# Patient Record
Sex: Male | Born: 1945 | Race: White | Hispanic: No | Marital: Married | Smoking: Never smoker
Health system: Southern US, Community
[De-identification: ages and names within clinical notes are randomized; demographics above are authoritative.]

## PROBLEM LIST (undated history)

## (undated) DIAGNOSIS — H269 Unspecified cataract: Secondary | ICD-10-CM

## (undated) DIAGNOSIS — M1990 Unspecified osteoarthritis, unspecified site: Secondary | ICD-10-CM

## (undated) DIAGNOSIS — E559 Vitamin D deficiency, unspecified: Secondary | ICD-10-CM

## (undated) DIAGNOSIS — J45909 Unspecified asthma, uncomplicated: Secondary | ICD-10-CM

## (undated) DIAGNOSIS — I2510 Atherosclerotic heart disease of native coronary artery without angina pectoris: Secondary | ICD-10-CM

## (undated) DIAGNOSIS — R55 Syncope and collapse: Secondary | ICD-10-CM

## (undated) DIAGNOSIS — E039 Hypothyroidism, unspecified: Secondary | ICD-10-CM

## (undated) DIAGNOSIS — C4491 Basal cell carcinoma of skin, unspecified: Secondary | ICD-10-CM

## (undated) DIAGNOSIS — K219 Gastro-esophageal reflux disease without esophagitis: Secondary | ICD-10-CM

## (undated) DIAGNOSIS — J8410 Pulmonary fibrosis, unspecified: Secondary | ICD-10-CM

## (undated) DIAGNOSIS — E785 Hyperlipidemia, unspecified: Secondary | ICD-10-CM

## (undated) DIAGNOSIS — G43909 Migraine, unspecified, not intractable, without status migrainosus: Secondary | ICD-10-CM

## (undated) DIAGNOSIS — N400 Enlarged prostate without lower urinary tract symptoms: Secondary | ICD-10-CM

## (undated) DIAGNOSIS — T7840XA Allergy, unspecified, initial encounter: Secondary | ICD-10-CM

## (undated) HISTORY — DX: Allergy, unspecified, initial encounter: T78.40XA

## (undated) HISTORY — DX: Basal cell carcinoma of skin, unspecified: C44.91

## (undated) HISTORY — PX: OTHER SURGICAL HISTORY: SHX169

## (undated) HISTORY — DX: Pulmonary fibrosis, unspecified: J84.10

## (undated) HISTORY — PX: SKIN CANCER EXCISION: SHX779

## (undated) HISTORY — PX: CARDIAC CATHETERIZATION: SHX172

## (undated) HISTORY — DX: Atherosclerotic heart disease of native coronary artery without angina pectoris: I25.10

## (undated) HISTORY — PX: SPINE SURGERY: SHX786

## (undated) HISTORY — DX: Unspecified cataract: H26.9

## (undated) HISTORY — DX: Unspecified osteoarthritis, unspecified site: M19.90

## (undated) HISTORY — PX: COLONOSCOPY: SHX174

---
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
## 2011-03-04 ENCOUNTER — Ambulatory Visit (INDEPENDENT_AMBULATORY_CARE_PROVIDER_SITE_OTHER): Payer: Medicare Other | Admitting: Urology

## 2011-03-04 DIAGNOSIS — N400 Enlarged prostate without lower urinary tract symptoms: Secondary | ICD-10-CM

## 2011-03-04 DIAGNOSIS — R351 Nocturia: Secondary | ICD-10-CM

## 2012-06-23 DIAGNOSIS — R079 Chest pain, unspecified: Secondary | ICD-10-CM

## 2012-06-24 ENCOUNTER — Other Ambulatory Visit: Payer: Self-pay | Admitting: Physician Assistant

## 2012-06-24 DIAGNOSIS — R079 Chest pain, unspecified: Secondary | ICD-10-CM

## 2012-06-25 ENCOUNTER — Inpatient Hospital Stay (HOSPITAL_COMMUNITY)
Admission: AD | Admit: 2012-06-25 | Discharge: 2012-06-26 | DRG: 287 | Disposition: A | Discharge status: Home / Self Care | Payer: Medicare Other | Source: Other Acute Inpatient Hospital | Attending: Cardiology | Admitting: Cardiology

## 2012-06-25 ENCOUNTER — Encounter (HOSPITAL_COMMUNITY): Payer: Self-pay | Admitting: *Deleted

## 2012-06-25 ENCOUNTER — Encounter (HOSPITAL_COMMUNITY)
Admission: AD | Disposition: A | Discharge status: Home / Self Care | Payer: Self-pay | Source: Other Acute Inpatient Hospital | Attending: Cardiology

## 2012-06-25 ENCOUNTER — Ambulatory Visit (HOSPITAL_COMMUNITY): Admission: RE | Admit: 2012-06-25 | Payer: Medicare Other | Source: Ambulatory Visit | Admitting: Cardiology

## 2012-06-25 ENCOUNTER — Other Ambulatory Visit: Payer: Self-pay | Admitting: Physician Assistant

## 2012-06-25 DIAGNOSIS — Z7982 Long term (current) use of aspirin: Secondary | ICD-10-CM

## 2012-06-25 DIAGNOSIS — J45909 Unspecified asthma, uncomplicated: Secondary | ICD-10-CM | POA: Diagnosis present

## 2012-06-25 DIAGNOSIS — R079 Chest pain, unspecified: Secondary | ICD-10-CM | POA: Diagnosis present

## 2012-06-25 DIAGNOSIS — I2510 Atherosclerotic heart disease of native coronary artery without angina pectoris: Secondary | ICD-10-CM

## 2012-06-25 DIAGNOSIS — K219 Gastro-esophageal reflux disease without esophagitis: Secondary | ICD-10-CM | POA: Diagnosis present

## 2012-06-25 DIAGNOSIS — Z886 Allergy status to analgesic agent status: Secondary | ICD-10-CM

## 2012-06-25 DIAGNOSIS — R911 Solitary pulmonary nodule: Secondary | ICD-10-CM | POA: Diagnosis present

## 2012-06-25 DIAGNOSIS — Z8249 Family history of ischemic heart disease and other diseases of the circulatory system: Secondary | ICD-10-CM

## 2012-06-25 DIAGNOSIS — Z79899 Other long term (current) drug therapy: Secondary | ICD-10-CM

## 2012-06-25 DIAGNOSIS — E785 Hyperlipidemia, unspecified: Secondary | ICD-10-CM | POA: Diagnosis present

## 2012-06-25 HISTORY — DX: Gastro-esophageal reflux disease without esophagitis: K21.9

## 2012-06-25 HISTORY — DX: Migraine, unspecified, not intractable, without status migrainosus: G43.909

## 2012-06-25 HISTORY — PX: LEFT HEART CATHETERIZATION WITH CORONARY ANGIOGRAM: SHX5451

## 2012-06-25 HISTORY — DX: Unspecified asthma, uncomplicated: J45.909

## 2012-06-25 HISTORY — DX: Syncope and collapse: R55

## 2012-06-25 HISTORY — DX: Hyperlipidemia, unspecified: E78.5

## 2012-06-25 SURGERY — LEFT HEART CATHETERIZATION WITH CORONARY ANGIOGRAM
Anesthesia: LOCAL

## 2012-06-25 MED ORDER — SODIUM CHLORIDE 0.9 % IJ SOLN: 3.0000 mL | INTRAMUSCULAR | Status: DC | PRN

## 2012-06-25 MED ORDER — ACETAMINOPHEN 325 MG PO TABS: 650.0000 mg | ORAL_TABLET | ORAL | Status: DC | PRN

## 2012-06-25 MED ORDER — SODIUM CHLORIDE 0.9 % IV SOLN: INTRAVENOUS | Status: DC

## 2012-06-25 MED ORDER — DIAZEPAM 5 MG PO TABS
5.0000 mg | ORAL_TABLET | ORAL | Status: AC
  Administered 2012-06-25: 5 mg via ORAL
  Filled 2012-06-25: qty 1

## 2012-06-25 MED ORDER — LIDOCAINE HCL (PF) 1 % IJ SOLN
INTRAMUSCULAR | Status: AC
  Filled 2012-06-25: qty 30

## 2012-06-25 MED ORDER — SODIUM CHLORIDE 0.9 % IV SOLN
INTRAVENOUS | Status: AC
  Administered 2012-06-25: 18:00:00 via INTRAVENOUS

## 2012-06-25 MED ORDER — SODIUM CHLORIDE 0.9 % IV SOLN: 250.0000 mL | INTRAVENOUS | Status: DC | PRN

## 2012-06-25 MED ORDER — HEPARIN SODIUM (PORCINE) 1000 UNIT/ML IJ SOLN
INTRAMUSCULAR | Status: AC
  Filled 2012-06-25: qty 1

## 2012-06-25 MED ORDER — SODIUM CHLORIDE 0.9 % IJ SOLN
3.0000 mL | Freq: Two times a day (BID) | INTRAMUSCULAR | Status: DC
  Administered 2012-06-25: 3 mL via INTRAVENOUS

## 2012-06-25 MED ORDER — SODIUM CHLORIDE 0.9 % IJ SOLN: 3.0000 mL | Freq: Two times a day (BID) | INTRAMUSCULAR | Status: DC

## 2012-06-25 MED ORDER — HEPARIN (PORCINE) IN NACL 2-0.9 UNIT/ML-% IJ SOLN
INTRAMUSCULAR | Status: AC
  Filled 2012-06-25: qty 2000

## 2012-06-25 MED ORDER — MIDAZOLAM HCL 2 MG/2ML IJ SOLN
INTRAMUSCULAR | Status: AC
  Filled 2012-06-25: qty 2

## 2012-06-25 MED ORDER — VERAPAMIL HCL 2.5 MG/ML IV SOLN
INTRAVENOUS | Status: AC
  Filled 2012-06-25: qty 2

## 2012-06-25 MED ORDER — ONDANSETRON HCL 4 MG/2ML IJ SOLN: 4.0000 mg | Freq: Four times a day (QID) | INTRAMUSCULAR | Status: DC | PRN

## 2012-06-25 MED ORDER — ATORVASTATIN CALCIUM 10 MG PO TABS
10.0000 mg | ORAL_TABLET | ORAL | Status: DC
  Administered 2012-06-25: 20:00:00 10 mg via ORAL
  Filled 2012-06-25 (×2): qty 1

## 2012-06-25 MED ORDER — NITROGLYCERIN 0.2 MG/ML ON CALL CATH LAB
INTRAVENOUS | Status: AC
  Filled 2012-06-25: qty 1

## 2012-06-25 MED ORDER — NITROGLYCERIN 0.4 MG SL SUBL: 0.4000 mg | SUBLINGUAL_TABLET | SUBLINGUAL | Status: DC | PRN

## 2012-06-25 MED ORDER — ASPIRIN 81 MG PO CHEW
324.0000 mg | CHEWABLE_TABLET | ORAL | Status: AC
  Administered 2012-06-25: 324 mg via ORAL
  Filled 2012-06-25: qty 4

## 2012-06-25 MED ORDER — ASPIRIN EC 81 MG PO TBEC
81.0000 mg | DELAYED_RELEASE_TABLET | Freq: Every day | ORAL | Status: DC
  Filled 2012-06-25: qty 1

## 2012-06-25 MED FILL — Ondansetron HCl Inj 4 MG/2ML (2 MG/ML): INTRAMUSCULAR | Qty: 2 | Status: AC

## 2012-06-25 MED FILL — Morphine Sulfate Inj 10 MG/ML: INTRAMUSCULAR | Qty: 1 | Status: AC

## 2012-06-25 NOTE — CV Procedure (Signed)
  Cardiac Catheterization Procedure Note  Name: Brad Cruz MRN: [id number] DOB: November 22, 1945  Procedure: Left Heart Cath, Selective Coronary Angiography, LV angiography  Indication:  Chest pain suggestive of new onset angina.  Procedural details: The right wrist was prepped, draped, and anesthetized with 1% lidocaine. Using modified Seldinger technique, a 5 French sheath was introduced into the right radial artery. Standard Judkins catheters were used for coronary angiography and left ventriculography. Catheter exchanges were performed over a guidewire. There were no immediate procedural complications. The patient was transferred to the post catheterization recovery area for further monitoring.  Procedural Findings:   Hemodynamics:     AO 102/63    LV  106/6   Coronary angiography:   Coronary dominance: Right  Left mainstem:   Normal  Left anterior descending (LAD):   Proximal mild/moderate calcification.  Proximal long 50%.  Mid 25%.  D1 tiny sized with ostial 80%.  D2 small and normal.    Left circumflex (LCx):  AV groove with ostial 30%.  MOM large and normal.  Ramus intermedius is large with ostial 30%.  Right coronary artery (RCA):  Very large with ostial 50 to 60%.  Long mid 30%.  PDA moderate sized with luminal irregularities.  PL small and normal.  Left ventriculography: Left ventricular systolic function is normal, LVEF is estimated at 65, there is no significant mitral regurgitation   Final Conclusions:  Nonobstructive CAD.  NL LV function.  Recommendations: Medical management with aggressive risk reduction.  I would proceed with PPI as was suggested.  If he has continued symptoms consideration could be given to repeat catheterization with possible IVUS to further evaluate the ostial RCA.  However, this does not appear to be hemodynamically significant and the perfusion study did not show ischemia in the RCA distribution.   [name] 06/25/2012, 4:37 PM

## 2012-06-25 NOTE — Progress Notes (Signed)
Utilization review completed.  

## 2012-06-25 NOTE — H&P (Signed)
NAMETHAILAND, DUBE DONALD ROOM: 210  UNIT NUMBER:  [id number] LOCATION: 67F 210 01 ADM/VISIT DATE:  06/23/2012   ADM Vaughan BrownerKathaleen Grinder:  [id number] DOB: [birth year]   PRIMARY CARDIOLOGIST:  Nona Dell, M.D. (new).  REFERRING PHYSICIAN:  Doreen Beam, M.D.  REASON FOR CONSULTATION:  Mr. Burgueno is a very pleasant 66 year old male with no known history of heart disease, now referred to Dr. Diona Browner for evaluation of chest pain.  HISTORY OF PRESENT ILLNESS:  Patient presents with cardiac risk factors notable for hyperlipidemia, age, and family history.  He denies any prior formal cardiac evaluation or diagnostic testing.  However, he recently had a transthoracic echocardiogram, per Dr. Sherril Croon' recommendation, in his office this past Monday, 06/21/2012 (results currently pending, but verbally normal), for evaluation of recent, new onset chest pain and dyspnea.  Patient presents with several-week history of new onset chest discomfort, with no strict correlation with exertion.  He also notes some recent exertional dyspnea.  He had a particularly significant episode this past Sunday, which occurred while at rest, and characterized as a chest "tightness", lasting approximately 10 minutes, with some associated diaphoresis.  The episode resolved spontaneously.  Since then, however, he has had some recurrent symptoms, culminating in an episode on morning of admission that was somewhat more intense (6/10), awaking him from sleep at 5 a.m., again with some associated diaphoresis.  Given this, he elected to be further evaluated here at the hospital, presenting to the ED with a blood pressure 125/73, pulse 71, and was afebrile.  Of note, he was treated with 4 baby aspirin and morphine, but no nitroglycerin.  Since admission, serial cardiac markers have all been within normal limits.  Admission EKG indicated normal sinus rhythm and no ischemic changes.  ALLERGIES:  ASPIRIN, IBUPROFEN.  HOME MEDICATIONS: 1. Crestor 5 mg  q.o.d. 2. Metamucil b.i.d. 3. Saw palmetto. 4. Coenzyme Q10.  PAST MEDICAL HISTORY: 1. Hyperlipidemia. 2. GERD. 3. Vasovagal syncope. 4. Negative stress test, 1993. 5. Vertigo. 6. Migraines.  SURGICAL HISTORY:  Nasal septoplasty.  SOCIAL HISTORY:  Married.  Denies tobacco or alcohol use.  Retired Clinical research associate.  FAMILY HISTORY:  Brother, age 37, status post MI at age 56, and coronary artery stenting approximately 8 years ago.  REVIEW OF SYSTEMS:  Cardiovascular:  Denies history of hypertension, diabetes mellitus, or exertional angina.  Gastrointestinal:  Has occasional reflux symptoms.  Constitutional:  Recent fever, or productive cough.  Vascular:  Denies intermittent claudication.  The remaining systems reviewed and are negative.  PHYSICAL EXAMINATION:  Vital signs:  Blood pressure 106/63, pulse 60s, regular, temperature afebrile, sats 97% on 2 L, weight 187 pounds.  General:  A 66 year old male, well nourished, well developed, sitting upright in no distress.  HEENT:  Normocephalic, atraumatic.  PERRLA, EOMI.  Neck:  Palpable bilateral carotid pulses without bruits; no JVD.  Lungs:  Clear to auscultation all fields.  Heart:  Regular rhythm, no significant murmurs.  No rubs or gallops.  Abdomen:  Soft, intact bowel sounds.  Extremities:  Palpable bilateral femoral pulses, without bruits; intact distal pulses; no peripheral edema.  Skin:  Warm and dry.  Musculoskeletal:  No obvious deformity.  Neurologic:  No focal deficit.  RADIOLOGIC STUDIES:  Admission chest x-ray:  No infiltrate, CHF, or pneumothorax; 6 mm left apical nodule. Admission EKG:  Normal sinus rhythm at 68 bpm; normal axis; no ischemic change.  LABORATORY DATA:  Normal CPK/MB and troponins (3).  INR 0.9.  D-dimer less than 0.22.  BNP 24.  LFTs normal.  Sodium 134, potassium 4.2, BUN 16, creatinine 0.9, glucose 108.  CBC:  Normal.  IMPRESSION: 1. Chest pain. a. Typical/atypical features. b. Normal cardiac markers. c. Negative  electrocardiogram. 2. Exertional dyspnea. 3. Gastroesophageal reflux disease. 4. Hyperlipidemia. 5. Pulmonary nodule.  PLAN: 1. Recommendation is to pursue further evaluation in-house with an exercise stress Myoview study, for risk stratification.  If this is negative for ischemia, then no further cardiac workup is indicated.  If, however, there is suggestion of ischemia, then further evaluation with diagnostic coronary angiography is recommended.  Patient is agreeable with this plan. 2. Of note, recent 2D echocardiogram, per Dr. Sherril Croon, performed on 06/21/2012, yielded normal LVF (EF 50% to 55%), with no significant valvular abnormalities, and no evidence of pericardial effusion.   __________________________    Rozell Searing, P.A. Alinda Money D: 06/24/2012 1628 T: 06/24/2012 1810 P: SER2   Attending note:  Patient seen and examined, records reviewed. Presented with chest pain symptoms having both typical and atypical features in the setting of hyperlipidemia, GERD, and CAD in brother although not clearly premature disease. Also some component of dyspnea on exertion. ECG shows no acute ST changes and cardiac markers are normal. He was referred for an exercise Myoview during which study he had anginal chest pain and only equivocal ST changes, perfusion images actually were normal with normal LVEF and normal TID ratio. I reviewed the testing with the patient and his wife - both still concerned about his cardiac status with continued symptoms. We discussed the risks and potential benefits of a diagnosic cardiac catheterization to definitively exclude obstructive CAD that may not have been demonstrated by noninvasive imaging and he wished to proceed. Transfer arranged to [facility]. If his coronaries are not clearly a culprit for his chest pain, might consider emperic PPI and keep close followup with Dr. Sherril Croon.  Of note, his CXR reports a 6 mm left apical lung nodule that will require followup. No  significant tobacco use noted. Seems unlikely that this is a source of symptoms, but a chest CT could likely better characterize this and help determine how best for Dr. Sherril Croon to follow this going forward.  Our [facility] team is to assume care of the patient after transfer to [facility], and appropriate post-hospital followup can be arranged from there.  Jonelle Sidle, M.D., F.A.C.C.

## 2012-06-25 NOTE — Progress Notes (Signed)
TR BAND REMOVAL  LOCATION:    right radial  DEFLATED PER PROTOCOL:    yes  TIME BAND OFF / DRESSING APPLIED:    1930   SITE UPON ARRIVAL:    Level 0  SITE AFTER BAND REMOVAL:    Level 0  REVERSE ALLEN'S TEST:     positive  CIRCULATION SENSATION AND MOVEMENT:    Within Normal Limits   yes  COMMENTS:   Tolerated procedure well 

## 2012-06-25 NOTE — Interval H&P Note (Signed)
History and Physical Interval Note:  06/25/2012 3:33 PM  Brad Cruz  has presented today for surgery, with the diagnosis of c/p  The various methods of treatment have been discussed with the patient and family. After consideration of risks, benefits and other options for treatment, the patient has consented to  Procedure(s) (LRB): LEFT HEART CATHETERIZATION WITH CORONARY ANGIOGRAM (N/A) as a surgical intervention .  The patient's history has been reviewed, patient examined, no change in status, stable for surgery.  I have reviewed the patient's chart and labs.  Questions were answered to the patient's satisfaction.     [name]

## 2012-06-26 ENCOUNTER — Encounter (HOSPITAL_COMMUNITY): Payer: Self-pay | Admitting: Nurse Practitioner

## 2012-06-26 DIAGNOSIS — R911 Solitary pulmonary nodule: Secondary | ICD-10-CM | POA: Diagnosis present

## 2012-06-26 DIAGNOSIS — J45909 Unspecified asthma, uncomplicated: Secondary | ICD-10-CM | POA: Diagnosis present

## 2012-06-26 DIAGNOSIS — E785 Hyperlipidemia, unspecified: Secondary | ICD-10-CM | POA: Diagnosis present

## 2012-06-26 DIAGNOSIS — R079 Chest pain, unspecified: Secondary | ICD-10-CM | POA: Diagnosis present

## 2012-06-26 MED ORDER — PANTOPRAZOLE SODIUM 40 MG PO TBEC: 40.0000 mg | DELAYED_RELEASE_TABLET | Freq: Every day | ORAL | Status: DC

## 2012-06-26 MED ORDER — ASPIRIN 81 MG PO TBEC: 81.0000 mg | DELAYED_RELEASE_TABLET | Freq: Every day | ORAL | Status: DC

## 2012-06-26 MED ORDER — NITROGLYCERIN 0.4 MG SL SUBL: 0.4000 mg | SUBLINGUAL_TABLET | SUBLINGUAL | Status: DC | PRN

## 2012-06-26 NOTE — Discharge Summary (Signed)
Patient ID: Brad Cruz,  MRN: [id number], DOB/AGE: Aug 29, 1946 66 y.o.  Admit date: 06/25/2012 Discharge date: 06/26/2012  Primary Care Provider: Iran Ouch, MD Primary Cardiologist: Darrow Bussing MD  Discharge Diagnoses Principal Problem:  *Chest pain at rest  **S/P Cath this admission revealing non-obstructive dzs and normal LV function. Active Problems:  Nodule of left lung  **6mm Left Apical Lung nodule on CXR @ [facility] -->Needs f/u CT of Chest as outpt.  GERD (gastroesophageal reflux disease)  Hyperlipidemia  Asthma  Allergies Allergies  Allergen Reactions  . Nsaids Itching   Procedures  Cardiac Catheterization 06/25/2012    Hemodynamics:                                      AO 102/63                                     LV  106/6              Coronary angiography:    Coronary dominance: Right  Left mainstem:   Normal Left anterior descending (LAD):   Proximal mild/moderate calcification.  Proximal long 50%.  Mid 25%.  D1 tiny sized with ostial 80%.  D2 small and normal.    Left circumflex (LCx):  AV groove with ostial 30%.  MOM large and normal.  Ramus intermedius is large with ostial 30%. Right coronary artery (RCA):  Very large with ostial 50 to 60%.  Long mid 30%.  PDA moderate sized with luminal irregularities.  PL small and normal.  Left ventriculography: Left ventricular systolic function is normal, LVEF is estimated at 65, there is no significant mitral regurgitation  _____________  History of Present Illness  66 y/o male without prior cardiac history.  He was admitted to [facility] earlier this week secondary to recurrent rest and exertional chest discomfort.  He r/o for MI and underwent exercise myoview, which showed normal LV function and no evidence of ischemia or infarct.  Despite this finding, pt continued to have chest pain and after discussion with [facility] at [facility], decision was made to transfer to [facility] for further  evaluation and catheterization.  Of note, pts CXR @ [facility] showed a 6mm Left Apical Lung nodule, which will require f/u imaging as an outpt. Hospital Course  Following arrival to [facility], pt was taken to the cath lab where diagnostic cath was performed and showed moderate nonobstructive CAD with normal LV function.  Medical therapy was felt to be warranted although if pt has ongoing pain, consideration for repeat cath with intravascular ultrasound of the ostial RCA could be given (though it was also noted that his myoview imaging showed no ischemia in this territory).  Pt has been placed on PPI therapy.  He has had no recurrent chest pain and has been ambulating without difficulty.  He will be discharged home today in good condition.  Discharge Vitals Blood pressure 109/65, pulse 68, temperature 97.8 F (36.6 C), temperature source Oral, resp. rate 18, height 5\' 10"  (1.778 m), weight 183 lb 6.8 oz (83.2 kg), SpO2 97.00%.  Filed Weights   06/25/12 1015 06/26/12 0440  Weight: 184 lb 3.2 oz (83.553 kg) 183 lb 6.8 oz (83.2 kg)   Labs  None  Disposition  Pt is being discharged home today in good condition.  Follow-up Plans & Appointments  Follow-up Information    Follow up with Nona Dell, MD. (2-4 wks, we will arrange)    Contact information:   [address] [place] [address] [phone]       Follow up with VYAS,DHRUV B., MD in 2 weeks.   Contact information:   [address] [place] [address] [phone]        Discharge Medications  Medication List  As of 06/26/2012  9:25 AM   TAKE these medications         aspirin 81 MG EC tablet   Take 1 tablet (81 mg total) by mouth daily.      COQ-10 PO   Take 1 capsule by mouth every evening.      GROUND FLAX SEEDS Powd   Take 15 mLs by mouth 2 (two) times daily.      nitroGLYCERIN 0.4 MG SL tablet   Commonly known as: NITROSTAT   Place 1 tablet (0.4 mg total) under the tongue  every 5 (five) minutes x 3 doses as needed for chest pain.      pantoprazole 40 MG tablet   Commonly known as: PROTONIX   Take 1 tablet (40 mg total) by mouth daily.      PSYLLIUM PO   Take 15 mLs by mouth 2 (two) times daily.      rosuvastatin 5 MG tablet   Commonly known as: CRESTOR   Take 5 mg by mouth every other day.      Saw Palmetto (Serenoa repens) 320 MG Caps   Take 1 capsule by mouth every evening.          Outstanding Labs/Studies  **Pt will require a f/u CT of the chest to eval 6mm Left Apical Pulmonary nodule that was noted on CXR @ [facility].  Duration of Discharge Encounter   Greater than 30 minutes including physician time.  Signed, Nicolasa Ducking NP 06/26/2012, 9:25 AM

## 2012-06-26 NOTE — Progress Notes (Signed)
@   Subjective:  Denies CP or dyspnea this AM   Objective:  Filed Vitals:   06/25/12 2000 06/26/12 0020 06/26/12 0440 06/26/12 0813  BP: 118/74 112/62 113/69 109/65  Pulse:  70  68  Temp: 97.4 F (36.3 C) 98.3 F (36.8 C) 97.6 F (36.4 C) 97.8 F (36.6 C)  TempSrc: Oral Oral Oral Oral  Resp: 16 17 17 18   Height:      Weight:   183 lb 6.8 oz (83.2 kg)   SpO2: 99% 99% 95% 97%    Intake/Output from previous day:  Intake/Output Summary (Last 24 hours) at 06/26/12 1610 Last data filed at 06/26/12 0000  Gross per 24 hour  Intake  952.5 ml  Output      0 ml  Net  952.5 ml    Physical Exam: Physical exam: Well-developed well-nourished in no acute distress.  Skin is warm and dry.  HEENT is normal.  Neck is supple. No thyromegaly.  Chest is clear to auscultation with normal expansion.  Cardiovascular exam is regular rate and rhythm.  Abdominal exam nontender or distended. No masses palpated. No RUQ tenderness. Extremities show no edema. Radial cath site with no hematoma neuro grossly intact   Assessment/Plan:  1) Chest pain - etiology unclear; cath results noted; plan medical therapy with ASA and statin; note DDimer and LFTs normal at [facility] per H and P; add protonix 40 mg daily and fu with Dr Sherril Croon in 2 weeks; FU with Dr Diona Browner 2-4 weeks. 2) Left apical nodule on chest xray - arrange noncontrast CT as outpatient with FU Dr Sherril Croon and Dr Diona Browner. 3) Hyperlipidemia - continue statin. >30 min PA and physician time D2 Olga Millers 06/26/2012, 8:19 AM

## 2012-06-26 NOTE — Discharge Summary (Signed)
See progress notes Brian Crenshaw  

## 2012-06-28 ENCOUNTER — Encounter: Payer: Self-pay | Admitting: *Deleted

## 2012-06-28 ENCOUNTER — Other Ambulatory Visit: Payer: Self-pay | Admitting: *Deleted

## 2012-06-28 DIAGNOSIS — R911 Solitary pulmonary nodule: Secondary | ICD-10-CM

## 2012-06-28 DIAGNOSIS — R079 Chest pain, unspecified: Secondary | ICD-10-CM

## 2012-07-01 ENCOUNTER — Other Ambulatory Visit: Payer: Self-pay | Admitting: Cardiology

## 2012-07-01 DIAGNOSIS — R079 Chest pain, unspecified: Secondary | ICD-10-CM

## 2012-07-01 DIAGNOSIS — R911 Solitary pulmonary nodule: Secondary | ICD-10-CM

## 2012-07-07 ENCOUNTER — Ambulatory Visit
Admission: RE | Admit: 2012-07-07 | Discharge: 2012-07-07 | Disposition: A | Discharge status: Home / Self Care | Payer: Medicare Other | Source: Ambulatory Visit | Attending: Cardiology | Admitting: Cardiology

## 2012-07-07 MED ORDER — IOHEXOL 300 MG/ML  SOLN
75.0000 mL | Freq: Once | INTRAMUSCULAR | Status: AC | PRN
  Administered 2012-07-07: 75 mL via INTRAVENOUS

## 2012-07-07 NOTE — Progress Notes (Signed)
Quick Note:  The nodule in question by prior plain film is described as being a calcified granuloma by chest CT. This is reassuring. Doubt that it will need to be evaluated further. ______

## 2012-07-08 ENCOUNTER — Telehealth: Payer: Self-pay | Admitting: *Deleted

## 2012-07-08 NOTE — Telephone Encounter (Signed)
Message copied by Eustace Moore on Thu Jul 08, 2012  4:24 PM ------      Message from: MCDOWELL, Illene Bolus      Created: Wed Jul 07, 2012  1:13 PM       The nodule in question by prior plain film is described as being a calcified granuloma by chest CT. This is reassuring. Doubt that it will need to be evaluated further.

## 2012-07-08 NOTE — Telephone Encounter (Signed)
Patient informed. 

## 2012-07-14 ENCOUNTER — Encounter: Payer: Medicare Other | Admitting: Cardiology

## 2012-07-19 ENCOUNTER — Encounter: Payer: Medicare Other | Admitting: Cardiology

## 2012-07-21 ENCOUNTER — Encounter: Payer: Medicare Other | Admitting: Physician Assistant

## 2012-07-26 ENCOUNTER — Ambulatory Visit (INDEPENDENT_AMBULATORY_CARE_PROVIDER_SITE_OTHER): Payer: Medicare Other | Admitting: Physician Assistant

## 2012-07-26 ENCOUNTER — Encounter: Payer: Self-pay | Admitting: Physician Assistant

## 2012-07-26 VITALS — BP 122/70 | HR 71 | Ht 70.0 in | Wt 191.8 lb

## 2012-07-26 DIAGNOSIS — I2510 Atherosclerotic heart disease of native coronary artery without angina pectoris: Secondary | ICD-10-CM | POA: Insufficient documentation

## 2012-07-26 NOTE — Progress Notes (Addendum)
Primary Cardiologist: Simona Huh, MD   HPI: Post hospital followup from [facility], after seen by [place] in consultation here at [facility] for CP with typical/atypical features. Patient ruled out for MI with normal cardiac markers, and proceeded with an in-house ischemic evaluation with a stress Myoview, which was normal. Nevertheless, patient continued to experience CP, and we recommended proceeding with coronary angiography.   - Cardiac catheterization: Nonobstructive CAD, with  NL LV function.  Aggressive medical management was recommended. However, if patient were to have continued symptoms, then consideration could be given to repeat catheterization with possible IVUS,  to further evaluate the ostial RCA. However, it was also noted that this did not appear to be hemodynamically significant, and  that the perfusion study did not show ischemia in the RCA distribution.   From a clinical standpoint, patient reports today that he was seen in followup by Dr. Sherril Croon shortly after his catheterization, and was placed on Voltaren 75 mg twice a day. This apparently completely resolved his discomfort over the last 2 weeks. Within the last 24 hours, however, the CP has returned (5/10), is not exacerbated by deep inspiration, but is reproducible by palpation. Also, he indicates today that this discomfort has always been localized under the left breast.   We also referred him for a CT scan of the chest, in followup of recently noted 6 mm left apical lung nodule on CXR, here at [facility]. The CT scan revealed a calcified granuloma in the left upper lobe, which correlated with the recent abnormal CXR.  Allergies  Allergen Reactions  . Nsaids Itching    Current Outpatient Prescriptions  Medication Sig Dispense Refill  . aspirin EC 81 MG EC tablet Take 1 tablet (81 mg total) by mouth daily.      . Coenzyme Q10 (COQ-10 PO) Take 1 capsule by mouth every evening.      . diclofenac (VOLTAREN) 75 MG EC  tablet Take 75 mg by mouth 2 (two) times daily.      . Flaxseed, Linseed, (GROUND FLAX SEEDS) POWD Take 15 mLs by mouth 2 (two) times daily.      . nitroGLYCERIN (NITROSTAT) 0.4 MG SL tablet Place 1 tablet (0.4 mg total) under the tongue every 5 (five) minutes x 3 doses as needed for chest pain.  25 tablet  3  . PSYLLIUM PO Take 15 mLs by mouth 2 (two) times daily.      . rosuvastatin (CRESTOR) 5 MG tablet Take 5 mg by mouth every other day.      . Saw Palmetto, Serenoa repens, 320 MG CAPS Take 1 capsule by mouth every evening.        Past Medical History  Diagnosis Date  . Asthma   . Chest pain at rest     a. 06/2012 Myoview:  No ischemia;  b. 06/25/2012 Cath:  LM nl, LAD 50p, 83m, D1 80 ost (small), D2 nl (small), LCX 30 ost, OM1 nl, RI 30 ost, RCA 50-6- ost, 69m, PDA minor irregs, PL nl (small), EF 65%.   . Hyperlipidemia   . GERD (gastroesophageal reflux disease)   . Vasovagal syncope   . Migraines   . Nodule of left lung     a. 6mm Left Apical Lung nodule noted on CXR 06/2012 **Needs f/u CT as outpt**    Past Surgical History  Procedure Date  . Deviated septum repair 1989     History   Social History  . Marital Status: Married  Spouse Name: N/A    Number of Children: N/A  . Years of Education: N/A   Occupational History  . Not on file.   Social History Main Topics  . Smoking status: Never Smoker   . Smokeless tobacco: Not on file  . Alcohol Use: No  . Drug Use: No  . Sexually Active:    Other Topics Concern  . Not on file   Social History Narrative  . No narrative on file    No family history on file.  ROS: no nausea, vomiting; no fever, chills; no melena, hematochezia; no claudication  PHYSICAL EXAM: BP 122/70  Pulse 71  Ht 5\' 10"  (1.778 m)  Wt 191 lb 12.8 oz (87 kg)  BMI 27.52 kg/m2  SpO2 98% GENERAL: 66 year old male; NAD HEENT: NCAT, PERRLA, EOMI; sclera clear; no xanthelasma NECK: no JVD; no TM LUNGS: CTA bilaterally CARDIAC: RRR (S1, S2); no  significant murmurs; no rubs or gallops ABDOMEN: soft, non-tender; intact BS EXTREMETIES: Palpable right RP, no hematoma/ecchymosis; no significant peripheral edema SKIN: warm/dry; no obvious rash/lesions MUSCULOSKELETAL: no joint deformity NEURO: no focal deficit; NL affect   EKG:    ASSESSMENT & PLAN:  CAD (coronary artery disease) Nonobstructive CAD, by recent cardiac catheterization. Patient symptoms are quite atypical and most consistent with musculoskeletal etiology. Therefore, no further workup recommended, and patient is to remain on low-dose ASA and statin. Would also not recommend using NTG tablets for this noncardiac CP. We'll have patient return to Dr. Diona Browner in 6 months, for ongoing aggressive secondary prevention therapy. Also of note, recommendation today by Dr. Antoine Poche is for patient to have a routine GXT in one year, as a followup screening test for his nonobstructive CAD.  Hyperlipidemia Followed by primary M.D. Recommend aggressive management with target LDL 70 or less, if feasible. Continue current low-dose Crestor, pending further recommendations.  Nodule of left lung Recently diagnosed as calcified granuloma, by followup chest CT scan.    Gene Yamileth Hayse, PAC   History and all data above reviewed.  Patient examined.  I agree with the findings as above.  The patient's chest pain is reproducible with palpation. The patient exam reveals COR: RRR, no rub  ,  Lungs: Clear  ,  Abd: Positive bowel sounds, no rebound no guarding, Ext No edema  .  All available labs, radiology testing, previous records reviewed. Agree with documented assessment and plan. I had a long discussion with the patient. He does have nonobstructive coronary disease and should have followup exercise treadmill testing. However, her current symptoms are not related to this as they are clearly reproducible with palpation and apparently musculoskeletal. He will continue to have his followup of an incidentally  noted lung lesion in the liver abnormality which was felt to be probably benign. This will be per Ignatius Specking., MD  [name]  7:02 PM  07/26/2012

## 2012-07-26 NOTE — Assessment & Plan Note (Signed)
Recently diagnosed as calcified granuloma, by followup chest CT scan.

## 2012-07-26 NOTE — Assessment & Plan Note (Addendum)
Nonobstructive CAD, by recent cardiac catheterization. Patient symptoms are quite atypical and most consistent with musculoskeletal etiology. Therefore, no further workup recommended, and patient is to remain on low-dose ASA and statin. Would also not recommend using NTG tablets for this noncardiac CP. We'll have patient return to Dr. Diona Browner in 6 months, for ongoing aggressive secondary prevention therapy. Also of note, recommendation today by Dr. Antoine Poche is for patient to have a routine GXT in one year, as a followup screening test for his nonobstructive CAD.

## 2012-07-26 NOTE — Assessment & Plan Note (Signed)
Followed by primary M.D. Recommend aggressive management with target LDL 70 or less, if feasible. Continue current low-dose Crestor, pending further recommendations.

## 2012-07-26 NOTE — Patient Instructions (Addendum)
Continue all current medications. Your physician wants you to follow up in: 6 months.  You will receive a reminder letter in the mail one-two months in advance.  If you don't receive a letter, please call our office to schedule the follow up appointment   

## 2012-10-16 ENCOUNTER — Encounter (HOSPITAL_COMMUNITY): Payer: Self-pay | Admitting: Emergency Medicine

## 2012-10-16 ENCOUNTER — Emergency Department (HOSPITAL_COMMUNITY)
Admission: EM | Admit: 2012-10-16 | Discharge: 2012-10-16 | Disposition: A | Discharge status: Home / Self Care | Payer: Medicare Other | Attending: Emergency Medicine | Admitting: Emergency Medicine

## 2012-10-16 ENCOUNTER — Emergency Department (HOSPITAL_COMMUNITY): Payer: Medicare Other

## 2012-10-16 DIAGNOSIS — Z7982 Long term (current) use of aspirin: Secondary | ICD-10-CM | POA: Insufficient documentation

## 2012-10-16 DIAGNOSIS — M5430 Sciatica, unspecified side: Secondary | ICD-10-CM | POA: Insufficient documentation

## 2012-10-16 DIAGNOSIS — Z9889 Other specified postprocedural states: Secondary | ICD-10-CM | POA: Insufficient documentation

## 2012-10-16 DIAGNOSIS — G8929 Other chronic pain: Secondary | ICD-10-CM | POA: Insufficient documentation

## 2012-10-16 DIAGNOSIS — E785 Hyperlipidemia, unspecified: Secondary | ICD-10-CM | POA: Insufficient documentation

## 2012-10-16 DIAGNOSIS — Z8679 Personal history of other diseases of the circulatory system: Secondary | ICD-10-CM | POA: Insufficient documentation

## 2012-10-16 DIAGNOSIS — J45909 Unspecified asthma, uncomplicated: Secondary | ICD-10-CM | POA: Insufficient documentation

## 2012-10-16 DIAGNOSIS — Z8709 Personal history of other diseases of the respiratory system: Secondary | ICD-10-CM | POA: Insufficient documentation

## 2012-10-16 DIAGNOSIS — Z8719 Personal history of other diseases of the digestive system: Secondary | ICD-10-CM | POA: Insufficient documentation

## 2012-10-16 DIAGNOSIS — Z79899 Other long term (current) drug therapy: Secondary | ICD-10-CM | POA: Insufficient documentation

## 2012-10-16 MED ORDER — HYDROMORPHONE HCL PF 1 MG/ML IJ SOLN
1.0000 mg | Freq: Once | INTRAMUSCULAR | Status: AC
  Administered 2012-10-16: 1 mg via INTRAVENOUS
  Filled 2012-10-16: qty 1

## 2012-10-16 MED ORDER — DIAZEPAM 5 MG PO TABS: ORAL_TABLET | ORAL | Status: DC

## 2012-10-16 MED ORDER — ONDANSETRON HCL 4 MG/2ML IJ SOLN
4.0000 mg | Freq: Once | INTRAMUSCULAR | Status: AC
  Administered 2012-10-16: 4 mg via INTRAVENOUS
  Filled 2012-10-16: qty 2

## 2012-10-16 MED ORDER — OXYCODONE-ACETAMINOPHEN 5-325 MG PO TABS
1.0000 | ORAL_TABLET | Freq: Once | ORAL | Status: AC
  Administered 2012-10-16: 1 via ORAL
  Filled 2012-10-16: qty 1

## 2012-10-16 MED ORDER — PREDNISONE 20 MG PO TABS
60.0000 mg | ORAL_TABLET | Freq: Once | ORAL | Status: AC
  Administered 2012-10-16: 60 mg via ORAL
  Filled 2012-10-16: qty 3

## 2012-10-16 MED ORDER — PREDNISONE 20 MG PO TABS: ORAL_TABLET | ORAL | Status: DC

## 2012-10-16 MED ORDER — OXYCODONE-ACETAMINOPHEN 5-325 MG PO TABS: 1.0000 | ORAL_TABLET | Freq: Four times a day (QID) | ORAL | Status: DC | PRN

## 2012-10-16 MED ORDER — DIAZEPAM 5 MG/ML IJ SOLN
5.0000 mg | Freq: Once | INTRAMUSCULAR | Status: AC
  Administered 2012-10-16: 5 mg via INTRAVENOUS
  Filled 2012-10-16: qty 2

## 2012-10-16 NOTE — ED Notes (Signed)
Pt presents to ED via EMS with complaints of back pain. Patient reports sharp pain in left flank area down to knee. NAD.

## 2012-10-16 NOTE — ED Provider Notes (Signed)
Medical screening examination/treatment/procedure(s) were conducted as a shared visit with non-physician practitioner(s) and myself.  I personally evaluated the patient during the encounter  Flint Melter, MD 10/16/12 1705

## 2012-10-16 NOTE — ED Notes (Signed)
Pt discharged to home with family. NAD.  

## 2012-10-16 NOTE — ED Provider Notes (Signed)
History     CSN: [id number]  Arrival date & time 10/16/12  1139   First MD Initiated Contact with Patient 10/16/12 1247      No chief complaint on file. CC: L leg pain  (Consider location/radiation/quality/duration/timing/severity/associated sxs/prior treatment) HPI Comments: Patient presents with 2 weeks of left sided lower back and radicular symptoms. Patient does not have a history of sciatica or disc problems. Patient states that 2 days prior to this severe pain he underwent a chiropractic manipulation. Since that time the pain has been gradually worsening. He saw his primary physician several days ago and received a steroid injection into the area which helped temporarily but the next day the pain was much more severe. Patient awoke today and could hardly walk. Pain is worse with standing and walking. He describes the pain as a throbbing pain that shoots from his left buttocks down the back of his left leg to his knee. He has not noticed any weakness. No red flag signs and symptoms of lower back pain other than age. Onset gradual. Course is constant. Patient has been treating at home with Tylenol which has not helped.  The history is provided by the patient and the spouse.    Past Medical History  Diagnosis Date  . Asthma   . Chest pain at rest     a. 06/2012 Myoview:  No ischemia;  b. 06/25/2012 Cath:  LM nl, LAD 50p, 73m, D1 80 ost (small), D2 nl (small), LCX 30 ost, OM1 nl, RI 30 ost, RCA 50-6- ost, 14m, PDA minor irregs, PL nl (small), EF 65%.   . Hyperlipidemia   . GERD (gastroesophageal reflux disease)   . Vasovagal syncope   . Migraines   . Nodule of left lung     a. 6mm Left Apical Lung nodule noted on CXR 06/2012 **Needs f/u CT as outpt**    Past Surgical History  Procedure Date  . Deviated septum repair 1989     History reviewed. No pertinent family history.  History  Substance Use Topics  . Smoking status: Never Smoker   . Smokeless tobacco: Not on file  .  Alcohol Use: No      Review of Systems  Constitutional: Negative for fever and unexpected weight change.  HENT: Negative for neck pain.   Eyes: Negative for redness.  Respiratory: Negative for cough and shortness of breath.   Cardiovascular: Negative for chest pain and leg swelling.  Gastrointestinal: Negative for constipation.       Neg for fecal incontinence  Genitourinary: Negative for hematuria, flank pain and difficulty urinating.       Negative for urinary incontinence or retention  Musculoskeletal: Positive for back pain.  Neurological: Negative for weakness and numbness.       Negative for saddle paresthesias     Allergies  Nsaids  Home Medications   Current Outpatient Rx  Name  Route  Sig  Dispense  Refill  . ACETAMINOPHEN 500 MG PO TABS   Oral   Take 1,000 mg by mouth every 6 (six) hours as needed. For pain         . ASPIRIN 81 MG PO TBEC   Oral   Take 81 mg by mouth daily.         . COQ-10 PO   Oral   Take 1 capsule by mouth every evening.         Marland Kitchen DIAZEPAM 5 MG PO TABS   Oral   Take 5 mg by  mouth at bedtime as needed. For tremors         . GROUND FLAX SEEDS PO POWD   Oral   Take 15 mLs by mouth 2 (two) times daily.         Marland Kitchen NITROGLYCERIN 0.4 MG SL SUBL   Sublingual   Place 0.4 mg under the tongue every 5 (five) minutes x 3 doses as needed. For chest pain         . PSYLLIUM PO   Oral   Take 15 mLs by mouth 2 (two) times daily.         Marland Kitchen ROSUVASTATIN CALCIUM 5 MG PO TABS   Oral   Take 5 mg by mouth daily.          . SAW PALMETTO (SERENOA REPENS) 320 MG PO CAPS   Oral   Take 1 capsule by mouth every evening.           BP 131/77  Pulse 82  Temp 97.5 F (36.4 C) (Oral)  Resp 16  SpO2 95%  Physical Exam  Nursing note and vitals reviewed. Constitutional: He appears well-developed and well-nourished.  HENT:  Head: Normocephalic and atraumatic.  Eyes: Conjunctivae normal are normal.  Neck: Normal range of motion.   Abdominal: Soft. There is no tenderness. There is no CVA tenderness.  Musculoskeletal: Normal range of motion. He exhibits no tenderness.       No step-off noted with palpation of spine.   Neurological: He is alert. He has normal reflexes. No sensory deficit. He exhibits normal muscle tone.       5/5 strength in entire lower extremities bilaterally. No sensation deficit.   Skin: Skin is warm and dry.  Psychiatric: He has a normal mood and affect.    ED Course  Procedures (including critical care time)  Labs Reviewed - No data to display Dg Lumbar Spine Complete  10/16/2012  *RADIOLOGY REPORT*  Clinical Data: Low back pain radiating to left leg.  LUMBAR SPINE - COMPLETE 4+ VIEW  Comparison: None.  Findings:  There is no evidence of lumbar spine fracture. Alignment is normal.  Intervertebral disc spaces are maintained.  IMPRESSION: Negative.   Original Report Authenticated By: Myles Rosenthal, M.D.      1. Sciatica     12:57 PM Patient seen and examined. Work-up initiated. Medications ordered.   Vital signs reviewed and are as follows: Filed Vitals:   10/16/12 1147  BP: 131/77  Pulse: 82  Temp: 97.5 F (36.4 C)  Resp: 16   X-ray reviewed by myself. Radiologist report reviewed. No acute process.   Pt d/w and seen by Dr. Effie Shy.   Pain improved but not gone with ED treatment. Patient agreeable to d/c with PCP follow-up.   No red flag s/s of low back pain other than age. Patient was counseled on back pain precautions and told to do activity as tolerated but do not lift, push, or pull heavy objects more than 10 pounds for the next week.  Patient counseled to use ice or heat on back for no longer than 15 minutes every hour.   Patient prescribed muscle relaxer and counseled on proper use of muscle relaxant medication.    Patient prescribed narcotic pain medicine and counseled on proper use of narcotic pain medications. Counseled not to combine this medication with others containing  tylenol.   Urged patient not to drink alcohol, drive, or perform any other activities that requires focus while taking either of these medications.  Patient urged to follow-up with PCP if pain does not improve with treatment and rest or if pain becomes recurrent. Urged to return with worsening severe pain, loss of bowel or bladder control, trouble walking.   The patient verbalizes understanding and agrees with the plan.   MDM  Patient with back pain -- radicular in nature. No neurological deficits. Patient is ambulatory with pain. No warning symptoms of back pain including: loss of bowel or bladder control, night sweats, waking from sleep with back pain, unexplained fevers or weight loss, h/o cancer, IVDU, recent trauma. No concern for cauda equina, epidural abscess, or other serious cause of back pain. Conservative measures such as rest, ice/heat and pain medicine indicated with PCP follow-up if no improvement with conservative management. X-ray performed 2/2 age and is normal.           Renne Crigler, [place] 10/16/12 [phone]

## 2012-10-16 NOTE — ED Provider Notes (Signed)
Brad Cruz is a 66 y.o. male who developed severe back pain after a spinal adjustment by his chiropractor. He gets regular sponges with the two-week [place]. He has pain in his left buttock, radiating to the left posterior knee. There is no other trauma. There are no bowel or urine incontinences. He is able to walk  Exam- left buttock and left thigh pain. Normal strength in arms and legs bilaterally   Medical screening examination/treatment/procedure(s) were conducted as a shared visit with non-physician practitioner(s) and myself.  I personally evaluated the patient during the encounter  Flint Melter, MD 10/16/12 1704

## 2012-10-16 NOTE — ED Notes (Signed)
Oxygen sats 88% on room air after meds given,,, 2 L [place] applied and sats increased to 95%.Marland KitchenMarland Kitchen

## 2012-10-21 ENCOUNTER — Other Ambulatory Visit: Payer: Self-pay | Admitting: Internal Medicine

## 2012-10-21 DIAGNOSIS — M545 Low back pain: Secondary | ICD-10-CM

## 2012-10-22 ENCOUNTER — Ambulatory Visit
Admission: RE | Admit: 2012-10-22 | Discharge: 2012-10-22 | Disposition: A | Discharge status: Home / Self Care | Payer: Medicare Other | Source: Ambulatory Visit | Attending: Internal Medicine | Admitting: Internal Medicine

## 2012-10-22 DIAGNOSIS — M545 Low back pain: Secondary | ICD-10-CM

## 2012-10-22 MED ORDER — GADOBENATE DIMEGLUMINE 529 MG/ML IV SOLN
17.0000 mL | Freq: Once | INTRAVENOUS | Status: AC | PRN
  Administered 2012-10-22: 17 mL via INTRAVENOUS

## 2012-11-22 ENCOUNTER — Other Ambulatory Visit: Payer: Self-pay | Admitting: Neurosurgery

## 2012-11-22 ENCOUNTER — Encounter (HOSPITAL_COMMUNITY): Payer: Self-pay | Admitting: *Deleted

## 2012-11-22 ENCOUNTER — Encounter (HOSPITAL_COMMUNITY): Payer: Self-pay | Admitting: Pharmacy Technician

## 2012-11-22 NOTE — Progress Notes (Signed)
Pt was admitted to [facility] August 2013 with chest pain.  Pt had a negative stress test, and cardiac enzymes.  Chest pain continued and a cardiac cath was performed- showed nonobstructive CAD.  Pt denies any chest pain since August.

## 2012-11-23 ENCOUNTER — Encounter (HOSPITAL_COMMUNITY)
Admission: RE | Disposition: A | Discharge status: Home / Self Care | Payer: Self-pay | Source: Ambulatory Visit | Attending: Neurosurgery

## 2012-11-23 ENCOUNTER — Inpatient Hospital Stay (HOSPITAL_COMMUNITY)
Admission: RE | Admit: 2012-11-23 | Discharge: 2012-11-24 | DRG: 491 | Disposition: A | Discharge status: Home / Self Care | Payer: Medicare Other | Source: Ambulatory Visit | Attending: Neurosurgery | Admitting: Neurosurgery

## 2012-11-23 ENCOUNTER — Inpatient Hospital Stay (HOSPITAL_COMMUNITY): Payer: Medicare Other | Admitting: Anesthesiology

## 2012-11-23 ENCOUNTER — Encounter (HOSPITAL_COMMUNITY): Payer: Self-pay | Admitting: Anesthesiology

## 2012-11-23 ENCOUNTER — Inpatient Hospital Stay (HOSPITAL_COMMUNITY): Payer: Medicare Other

## 2012-11-23 DIAGNOSIS — M5126 Other intervertebral disc displacement, lumbar region: Principal | ICD-10-CM | POA: Diagnosis present

## 2012-11-23 DIAGNOSIS — Z23 Encounter for immunization: Secondary | ICD-10-CM

## 2012-11-23 DIAGNOSIS — E785 Hyperlipidemia, unspecified: Secondary | ICD-10-CM | POA: Diagnosis present

## 2012-11-23 DIAGNOSIS — M51379 Other intervertebral disc degeneration, lumbosacral region without mention of lumbar back pain or lower extremity pain: Secondary | ICD-10-CM | POA: Diagnosis present

## 2012-11-23 DIAGNOSIS — K219 Gastro-esophageal reflux disease without esophagitis: Secondary | ICD-10-CM | POA: Diagnosis present

## 2012-11-23 DIAGNOSIS — Z85828 Personal history of other malignant neoplasm of skin: Secondary | ICD-10-CM

## 2012-11-23 DIAGNOSIS — R911 Solitary pulmonary nodule: Secondary | ICD-10-CM | POA: Diagnosis present

## 2012-11-23 DIAGNOSIS — M5137 Other intervertebral disc degeneration, lumbosacral region: Secondary | ICD-10-CM | POA: Diagnosis present

## 2012-11-23 DIAGNOSIS — N400 Enlarged prostate without lower urinary tract symptoms: Secondary | ICD-10-CM | POA: Diagnosis present

## 2012-11-23 HISTORY — DX: Benign prostatic hyperplasia without lower urinary tract symptoms: N40.0

## 2012-11-23 HISTORY — PX: LUMBAR LAMINECTOMY/DECOMPRESSION MICRODISCECTOMY: SHX5026

## 2012-11-23 LAB — BASIC METABOLIC PANEL
CO2: 29 mEq/L (ref 19–32)
Chloride: 98 mEq/L (ref 96–112)
Creatinine, Ser: 0.96 mg/dL (ref 0.50–1.35)
Potassium: 4.2 mEq/L (ref 3.5–5.1)

## 2012-11-23 LAB — CBC
HCT: 43.3 % (ref 39.0–52.0)
Hemoglobin: 14.7 g/dL (ref 13.0–17.0)
MCV: 90.4 fL (ref 78.0–100.0)
RBC: 4.79 MIL/uL (ref 4.22–5.81)
RDW: 12.9 % (ref 11.5–15.5)
WBC: 7.8 10*3/uL (ref 4.0–10.5)

## 2012-11-23 LAB — SURGICAL PCR SCREEN: Staphylococcus aureus: NEGATIVE

## 2012-11-23 SURGERY — LUMBAR LAMINECTOMY/DECOMPRESSION MICRODISCECTOMY 1 LEVEL
Anesthesia: General | Site: Back | Laterality: Left | Wound class: Clean

## 2012-11-23 MED ORDER — CEFAZOLIN SODIUM-DEXTROSE 2-3 GM-% IV SOLR: 2.0000 g | INTRAVENOUS | Status: DC

## 2012-11-23 MED ORDER — CEFAZOLIN SODIUM 1-5 GM-% IV SOLN
1.0000 g | Freq: Three times a day (TID) | INTRAVENOUS | Status: AC
  Administered 2012-11-23 – 2012-11-24 (×2): 1 g via INTRAVENOUS
  Filled 2012-11-23 (×2): qty 50

## 2012-11-23 MED ORDER — MUPIROCIN 2 % EX OINT
TOPICAL_OINTMENT | CUTANEOUS | Status: AC
  Administered 2012-11-23: 1 via NASAL
  Filled 2012-11-23: qty 22

## 2012-11-23 MED ORDER — DEXAMETHASONE SODIUM PHOSPHATE 4 MG/ML IJ SOLN
INTRAMUSCULAR | Status: DC | PRN
  Administered 2012-11-23: 10 mg via INTRAVENOUS

## 2012-11-23 MED ORDER — NEOSTIGMINE METHYLSULFATE 1 MG/ML IJ SOLN
INTRAMUSCULAR | Status: DC | PRN
  Administered 2012-11-23: 3 mg via INTRAVENOUS

## 2012-11-23 MED ORDER — PROPOFOL 10 MG/ML IV BOLUS
INTRAVENOUS | Status: DC | PRN
  Administered 2012-11-23: 160 mg via INTRAVENOUS

## 2012-11-23 MED ORDER — ONDANSETRON HCL 4 MG/2ML IJ SOLN: 4.0000 mg | INTRAMUSCULAR | Status: DC | PRN

## 2012-11-23 MED ORDER — DIAZEPAM 5 MG PO TABS
5.0000 mg | ORAL_TABLET | Freq: Four times a day (QID) | ORAL | Status: DC | PRN
  Administered 2012-11-23: 5 mg via ORAL
  Filled 2012-11-23: qty 1

## 2012-11-23 MED ORDER — LACTATED RINGERS IV SOLN
INTRAVENOUS | Status: DC | PRN
  Administered 2012-11-23 (×2): via INTRAVENOUS

## 2012-11-23 MED ORDER — OXYCODONE HCL 5 MG PO TABS
ORAL_TABLET | ORAL | Status: AC
  Filled 2012-11-23: qty 1

## 2012-11-23 MED ORDER — ACETAMINOPHEN 10 MG/ML IV SOLN
INTRAVENOUS | Status: DC | PRN
  Administered 2012-11-23: 1000 mg via INTRAVENOUS

## 2012-11-23 MED ORDER — HYDROMORPHONE HCL PF 1 MG/ML IJ SOLN
INTRAMUSCULAR | Status: AC
  Filled 2012-11-23: qty 1

## 2012-11-23 MED ORDER — HYDROMORPHONE HCL PF 1 MG/ML IJ SOLN
0.2500 mg | INTRAMUSCULAR | Status: DC | PRN
  Administered 2012-11-23 (×4): 0.5 mg via INTRAVENOUS

## 2012-11-23 MED ORDER — SODIUM CHLORIDE 0.9 % IJ SOLN: 3.0000 mL | INTRAMUSCULAR | Status: DC | PRN

## 2012-11-23 MED ORDER — HEMOSTATIC AGENTS (NO CHARGE) OPTIME
TOPICAL | Status: DC | PRN
  Administered 2012-11-23: 1 via TOPICAL

## 2012-11-23 MED ORDER — MUPIROCIN 2 % EX OINT
TOPICAL_OINTMENT | Freq: Two times a day (BID) | CUTANEOUS | Status: DC
  Administered 2012-11-23: 1 via NASAL

## 2012-11-23 MED ORDER — OXYCODONE HCL 5 MG/5ML PO SOLN: 5.0000 mg | Freq: Once | ORAL | Status: AC | PRN

## 2012-11-23 MED ORDER — OXYCODONE-ACETAMINOPHEN 5-325 MG PO TABS: 1.0000 | ORAL_TABLET | ORAL | Status: DC | PRN

## 2012-11-23 MED ORDER — INFLUENZA VIRUS VACC SPLIT PF IM SUSP
0.5000 mL | INTRAMUSCULAR | Status: AC
  Administered 2012-11-24: 0.5 mL via INTRAMUSCULAR
  Filled 2012-11-23: qty 0.5

## 2012-11-23 MED ORDER — ROCURONIUM BROMIDE 100 MG/10ML IV SOLN
INTRAVENOUS | Status: DC | PRN
  Administered 2012-11-23: 50 mg via INTRAVENOUS

## 2012-11-23 MED ORDER — PNEUMOCOCCAL VAC POLYVALENT 25 MCG/0.5ML IJ INJ
0.5000 mL | INJECTION | INTRAMUSCULAR | Status: AC
  Administered 2012-11-24: 0.5 mL via INTRAMUSCULAR
  Filled 2012-11-23: qty 0.5

## 2012-11-23 MED ORDER — SODIUM CHLORIDE 0.9 % IJ SOLN
3.0000 mL | Freq: Two times a day (BID) | INTRAMUSCULAR | Status: DC
  Administered 2012-11-24: 3 mL via INTRAVENOUS

## 2012-11-23 MED ORDER — 0.9 % SODIUM CHLORIDE (POUR BTL) OPTIME
TOPICAL | Status: DC | PRN
  Administered 2012-11-23: 1000 mL

## 2012-11-23 MED ORDER — THROMBIN 5000 UNITS EX KIT
PACK | CUTANEOUS | Status: DC | PRN
  Administered 2012-11-23 (×2): 5000 [IU] via TOPICAL

## 2012-11-23 MED ORDER — ONDANSETRON HCL 4 MG/2ML IJ SOLN
INTRAMUSCULAR | Status: DC | PRN
  Administered 2012-11-23: 4 mg via INTRAVENOUS

## 2012-11-23 MED ORDER — ACETAMINOPHEN 650 MG RE SUPP: 650.0000 mg | RECTAL | Status: DC | PRN

## 2012-11-23 MED ORDER — MORPHINE SULFATE 2 MG/ML IJ SOLN
1.0000 mg | INTRAMUSCULAR | Status: DC | PRN
  Administered 2012-11-23: 2 mg via INTRAVENOUS
  Filled 2012-11-23: qty 1

## 2012-11-23 MED ORDER — ZOLPIDEM TARTRATE 5 MG PO TABS: 5.0000 mg | ORAL_TABLET | Freq: Every evening | ORAL | Status: DC | PRN

## 2012-11-23 MED ORDER — SODIUM CHLORIDE 0.9 % IV SOLN
INTRAVENOUS | Status: DC
  Administered 2012-11-23: 20:00:00 via INTRAVENOUS
  Administered 2012-11-24: 1000 mL via INTRAVENOUS

## 2012-11-23 MED ORDER — OXYCODONE HCL 5 MG PO TABS
5.0000 mg | ORAL_TABLET | Freq: Once | ORAL | Status: AC | PRN
  Administered 2012-11-23: 5 mg via ORAL

## 2012-11-23 MED ORDER — PHENOL 1.4 % MT LIQD: 1.0000 | OROMUCOSAL | Status: DC | PRN

## 2012-11-23 MED ORDER — MENTHOL 3 MG MT LOZG: 1.0000 | LOZENGE | OROMUCOSAL | Status: DC | PRN

## 2012-11-23 MED ORDER — GLYCOPYRROLATE 0.2 MG/ML IJ SOLN
INTRAMUSCULAR | Status: DC | PRN
  Administered 2012-11-23: 0.4 mg via INTRAVENOUS

## 2012-11-23 MED ORDER — NITROGLYCERIN 0.4 MG SL SUBL: 0.4000 mg | SUBLINGUAL_TABLET | SUBLINGUAL | Status: DC | PRN

## 2012-11-23 MED ORDER — PHENYLEPHRINE HCL 10 MG/ML IJ SOLN
INTRAMUSCULAR | Status: DC | PRN
  Administered 2012-11-23: 80 ug via INTRAVENOUS

## 2012-11-23 MED ORDER — ONDANSETRON HCL 4 MG/2ML IJ SOLN: 4.0000 mg | Freq: Four times a day (QID) | INTRAMUSCULAR | Status: DC | PRN

## 2012-11-23 MED ORDER — SODIUM CHLORIDE 0.9 % IV SOLN: 250.0000 mL | INTRAVENOUS | Status: DC

## 2012-11-23 MED ORDER — ACETAMINOPHEN 325 MG PO TABS: 650.0000 mg | ORAL_TABLET | ORAL | Status: DC | PRN

## 2012-11-23 MED ORDER — MIDAZOLAM HCL 5 MG/5ML IJ SOLN
INTRAMUSCULAR | Status: DC | PRN
  Administered 2012-11-23: 2 mg via INTRAVENOUS

## 2012-11-23 MED ORDER — LIDOCAINE HCL (CARDIAC) 20 MG/ML IV SOLN
INTRAVENOUS | Status: DC | PRN
  Administered 2012-11-23: 70 mg via INTRAVENOUS

## 2012-11-23 MED ORDER — ATORVASTATIN CALCIUM 10 MG PO TABS
10.0000 mg | ORAL_TABLET | Freq: Every day | ORAL | Status: DC
  Filled 2012-11-23 (×2): qty 1

## 2012-11-23 MED ORDER — CEFAZOLIN SODIUM-DEXTROSE 2-3 GM-% IV SOLR
INTRAVENOUS | Status: AC
  Administered 2012-11-23: 2 g via INTRAVENOUS
  Filled 2012-11-23: qty 50

## 2012-11-23 MED ORDER — FENTANYL CITRATE 0.05 MG/ML IJ SOLN
INTRAMUSCULAR | Status: DC | PRN
  Administered 2012-11-23 (×2): 50 ug via INTRAVENOUS
  Administered 2012-11-23: 100 ug via INTRAVENOUS

## 2012-11-23 SURGICAL SUPPLY — 56 items
BENZOIN TINCTURE PRP APPL 2/3 (GAUZE/BANDAGES/DRESSINGS) ×2 IMPLANT
BLADE SURG ROTATE 9660 (MISCELLANEOUS) IMPLANT
BUR ACORN 6.0 (BURR) ×2 IMPLANT
BUR MATCHSTICK NEURO 3.0 LAGG (BURR) ×2 IMPLANT
CANISTER SUCTION 2500CC (MISCELLANEOUS) ×2 IMPLANT
CLOTH BEACON ORANGE TIMEOUT ST (SAFETY) ×2 IMPLANT
CONT SPEC 4OZ CLIKSEAL STRL BL (MISCELLANEOUS) ×2 IMPLANT
DERMABOND ADVANCED (GAUZE/BANDAGES/DRESSINGS) ×1
DERMABOND ADVANCED .7 DNX12 (GAUZE/BANDAGES/DRESSINGS) ×1 IMPLANT
DRAPE LAPAROTOMY 100X72X124 (DRAPES) ×2 IMPLANT
DRAPE MICROSCOPE LEICA (MISCELLANEOUS) ×2 IMPLANT
DRAPE POUCH INSTRU U-SHP 10X18 (DRAPES) ×2 IMPLANT
DRSG PAD ABDOMINAL 8X10 ST (GAUZE/BANDAGES/DRESSINGS) IMPLANT
DURAPREP 26ML APPLICATOR (WOUND CARE) ×2 IMPLANT
ELECT REM PT RETURN 9FT ADLT (ELECTROSURGICAL) ×2
ELECTRODE REM PT RTRN 9FT ADLT (ELECTROSURGICAL) ×1 IMPLANT
GAUZE SPONGE 4X4 16PLY XRAY LF (GAUZE/BANDAGES/DRESSINGS) IMPLANT
GLOVE BIO SURGEON STRL SZ8 (GLOVE) ×2 IMPLANT
GLOVE BIOGEL M 8.0 STRL (GLOVE) ×4 IMPLANT
GLOVE BIOGEL PI IND STRL 8.5 (GLOVE) ×1 IMPLANT
GLOVE BIOGEL PI INDICATOR 8.5 (GLOVE) ×1
GLOVE EXAM NITRILE LRG STRL (GLOVE) IMPLANT
GLOVE EXAM NITRILE MD LF STRL (GLOVE) IMPLANT
GLOVE EXAM NITRILE XL STR (GLOVE) IMPLANT
GLOVE EXAM NITRILE XS STR PU (GLOVE) IMPLANT
GOWN BRE IMP SLV AUR LG STRL (GOWN DISPOSABLE) ×2 IMPLANT
GOWN BRE IMP SLV AUR XL STRL (GOWN DISPOSABLE) ×4 IMPLANT
GOWN STRL REIN 2XL LVL4 (GOWN DISPOSABLE) IMPLANT
KIT BASIN OR (CUSTOM PROCEDURE TRAY) ×2 IMPLANT
KIT ROOM TURNOVER OR (KITS) ×2 IMPLANT
NEEDLE HYPO 18GX1.5 BLUNT FILL (NEEDLE) IMPLANT
NEEDLE HYPO 21X1.5 SAFETY (NEEDLE) IMPLANT
NEEDLE HYPO 25X1 1.5 SAFETY (NEEDLE) IMPLANT
NEEDLE SPNL 20GX3.5 QUINCKE YW (NEEDLE) ×2 IMPLANT
NS IRRIG 1000ML POUR BTL (IV SOLUTION) ×2 IMPLANT
PACK LAMINECTOMY NEURO (CUSTOM PROCEDURE TRAY) ×2 IMPLANT
PAD ARMBOARD 7.5X6 YLW CONV (MISCELLANEOUS) ×6 IMPLANT
PATTIES SURGICAL .5 X1 (DISPOSABLE) ×2 IMPLANT
PATTIES SURGICAL 1X1 (DISPOSABLE) ×2 IMPLANT
RUBBERBAND STERILE (MISCELLANEOUS) ×4 IMPLANT
SPONGE GAUZE 4X4 12PLY (GAUZE/BANDAGES/DRESSINGS) ×2 IMPLANT
SPONGE LAP 4X18 X RAY DECT (DISPOSABLE) IMPLANT
SPONGE SURGIFOAM ABS GEL SZ50 (HEMOSTASIS) ×2 IMPLANT
STRIP CLOSURE SKIN 1/2X4 (GAUZE/BANDAGES/DRESSINGS) ×2 IMPLANT
SUT PROLENE 6 0 BV (SUTURE) ×2 IMPLANT
SUT VIC AB 0 CT1 18XCR BRD8 (SUTURE) ×1 IMPLANT
SUT VIC AB 0 CT1 8-18 (SUTURE) ×1
SUT VIC AB 2-0 CP2 18 (SUTURE) ×2 IMPLANT
SUT VIC AB 3-0 SH 8-18 (SUTURE) ×2 IMPLANT
SYR 20CC LL (SYRINGE) IMPLANT
SYR 20ML ECCENTRIC (SYRINGE) ×2 IMPLANT
SYR 5ML LL (SYRINGE) IMPLANT
TAPE CLOTH SURG 4X10 WHT LF (GAUZE/BANDAGES/DRESSINGS) ×2 IMPLANT
TOWEL OR 17X24 6PK STRL BLUE (TOWEL DISPOSABLE) ×2 IMPLANT
TOWEL OR 17X26 10 PK STRL BLUE (TOWEL DISPOSABLE) ×2 IMPLANT
WATER STERILE IRR 1000ML POUR (IV SOLUTION) ×2 IMPLANT

## 2012-11-23 NOTE — Progress Notes (Signed)
Op note [phone]

## 2012-11-23 NOTE — Transfer of Care (Signed)
Immediate Anesthesia Transfer of Care Note  Patient: Brad Cruz  Procedure(s) Performed: Procedure(s) (LRB) with comments: LUMBAR LAMINECTOMY/DECOMPRESSION MICRODISCECTOMY 1 LEVEL (Left) - Left Lumbar five-sacral one Diskectomy  Patient Location: PACU  Anesthesia Type:General  Level of Consciousness: awake, sedated, patient cooperative and responds to stimulation  Airway & Oxygen Therapy: Patient Spontanous Breathing and Patient connected to nasal cannula oxygen  Post-op Assessment: Report given to PACU RN, Post -op Vital signs reviewed and stable and Patient moving all extremities  Post vital signs: Reviewed and stable  Complications: No apparent anesthesia complications

## 2012-11-23 NOTE — Anesthesia Preprocedure Evaluation (Addendum)
Anesthesia Evaluation  Patient identified by MRN, date of birth, ID band Patient awake    Reviewed: Allergy & Precautions, H&P , NPO status , Patient's Chart, lab work & pertinent test results  History of Anesthesia Complications Negative for: history of anesthetic complications  Airway Mallampati: II  Neck ROM: full    Dental   Pulmonary asthma ,  Childhood  Allergy induced          Cardiovascular + CAD  No recent problems nl ef + myoview  Mild CAD    Neuro/Psych  Headaches,    GI/Hepatic GERD-  Controlled,  Endo/Other    Renal/GU      Musculoskeletal   Abdominal   Peds  Hematology   Anesthesia Other Findings   Reproductive/Obstetrics                          Anesthesia Physical Anesthesia Plan  ASA: III  Anesthesia Plan: General   Post-op Pain Management:    Induction: Intravenous  Airway Management Planned: Oral ETT  Additional Equipment:   Intra-op Plan:   Post-operative Plan: Extubation in OR  Informed Consent: I have reviewed the patients History and Physical, chart, labs and discussed the procedure including the risks, benefits and alternatives for the proposed anesthesia with the patient or authorized representative who has indicated his/her understanding and acceptance.   Dental advisory given  Plan Discussed with: CRNA and Anesthesiologist  Anesthesia Plan Comments:         Anesthesia Quick Evaluation

## 2012-11-23 NOTE — H&P (Signed)
Brad Cruz is an 67 y.o. male.   Chief Complaint:  Left leg pain                                                                            HPI: patient who has been complaining of lbp with radiation to the left leg for more than 6 weeks and not getting better with conservative treatment. Had a lumbar mri on 10/22/12 which was positive for a hnp  Past Medical History  Diagnosis Date  . Chest pain at rest     a. 06/2012 Myoview:  No ischemia;  b. 06/25/2012 Cath:  LM nl, LAD 50p, 86m, D1 80 ost (small), D2 nl (small), LCX 30 ost, OM1 nl, RI 30 ost, RCA 50-6- ost, 29m, PDA minor irregs, PL nl (small), EF 65%.   . Hyperlipidemia   . GERD (gastroesophageal reflux disease)   . Vasovagal syncope   . Nodule of left lung     a. 6mm Left Apical Lung nodule noted on CXR 06/2012 **Needs f/u CT as outpt**  . Asthma     childhood history- none since 1963  . Migraines     none in last 2 years.  Marland Kitchen BPH (benign prostatic hyperplasia)   . Cancer     under eye basal cell    Past Surgical History  Procedure Date  . Deviated septum repair 1989   . Cardiac catheterization   . Skin cancer excision     from under left eye basal cell    History reviewed. No pertinent family history. Social History:  reports that he has never smoked. He does not have any smokeless tobacco history on file. He reports that he does not drink alcohol or use illicit drugs.  Allergies:  Allergies  Allergen Reactions  . Cats Claw (Uncaria Tomentosa (Cats Claw)) Other (See Comments)    asthma  . Nsaids Itching    Possibly other NSAID'S, but ibuprofen for sure.   Marland Kitchen Shellfish-Derived Products Other (See Comments)    unknown  . Ibuprofen Hives, Swelling and Rash    No prescriptions prior to admission    No results found for this or any previous visit (from the past 48 hour(s)). No results found.  Review of Systems  Constitutional: Negative.   HENT: Negative.   Eyes: Negative.   Respiratory: Negative.     Cardiovascular: Negative.   Musculoskeletal: Positive for back pain.  Skin: Negative.   Neurological: Positive for sensory change and focal weakness.  Endo/Heme/Allergies: Negative.   Psychiatric/Behavioral: Negative.     There were no vitals taken for this visit. Physical Exam came limping from the left leg unable to sit. Hent,nl. Neck, nl. Cv, nl. Lungs clear. Abdomen soft. Extremities, nl.NEURO ABSENT LEFT ANKLE DTR. WEAKNESS OF LEFT FOOT 2-3/5. Mri shows a large hnp at l5s1  Assessment/Plan To undergo left l5s1 discectomy. He and his wife are aware of risks and benefits  [name] M 11/23/2012, 8:49 AM

## 2012-11-23 NOTE — Anesthesia Postprocedure Evaluation (Signed)
  Anesthesia Post-op Note  Patient: Brad Cruz  Procedure(s) Performed: Procedure(s) (LRB) with comments: LUMBAR LAMINECTOMY/DECOMPRESSION MICRODISCECTOMY 1 LEVEL (Left) - Left Lumbar five-sacral one Diskectomy  Patient Location: PACU  Anesthesia Type:General  Level of Consciousness: awake, oriented and patient cooperative  Airway and Oxygen Therapy: Patient Spontanous Breathing and Patient connected to nasal cannula oxygen  Post-op Pain: none  Post-op Assessment: Post-op Vital signs reviewed, Patient's Cardiovascular Status Stable and Respiratory Function Stable  Post-op Vital Signs: Reviewed and stable  Complications: No apparent anesthesia complications

## 2012-11-24 NOTE — Clinical Social Work Note (Signed)
Clinical Social Work   CSW received a consult for SNF. CSW reviewed chart and discussed pt with RN during progression. PT/OT are not recommending any follow up. CSW is signing off as no further needs identified.  Dede Query, MSW, Theresia Majors [phone]

## 2012-11-24 NOTE — Evaluation (Signed)
Occupational Therapy Evaluation Patient Details Name: Brad Cruz MRN: [id number] DOB: 1946-01-16 Today's Date: 11/24/2012 Time: 4098-1191 OT Time Calculation (min): 42 min  OT Assessment / Plan / Recommendation Clinical Impression  Pt s/p L5-S1 diskectomy.  Pt will benefit from acute OT services to address below problem list in prep for d/c home with wife.    OT Assessment  Patient needs continued OT Services    Follow Up Recommendations  Supervision - Intermittent    Barriers to Discharge None    Equipment Recommendations  None recommended by OT    Recommendations for Other Services    Frequency  Min 2X/week    Precautions / Restrictions Precautions Precautions: Back;Fall Precaution Booklet Issued: Yes (comment) Precaution Comments: pt and spouse educated Restrictions Weight Bearing Restrictions: No   Pertinent Vitals/Pain See vitals    ADL  Eating/Feeding: Performed;Independent Where Assessed - Eating/Feeding: Edge of bed Upper Body Bathing: Simulated;Set up Where Assessed - Upper Body Bathing: Unsupported sitting Lower Body Bathing: Simulated;Supervision/safety Where Assessed - Lower Body Bathing: Unsupported sitting Upper Body Dressing: Performed;Set up Where Assessed - Upper Body Dressing: Unsupported sitting Lower Body Dressing: Performed;Supervision/safety Where Assessed - Lower Body Dressing: Unsupported sitting Toilet Transfer: Simulated;Min guard Toilet Transfer Method: Sit to Barista:  (bed to chair) Tub/Shower Transfer: Simulated;Minimal assistance Tub/Shower Transfer Method: Science writer: Walk in shower Equipment Used: Gait belt Transfers/Ambulation Related to ADLs: min guard assist during ambulation without device ADL Comments: Educated pt on incorporating back precuations during ADLs.  Pt able to cross ankles over knees to don/doff socks and recommended he use this method for undergarment and  pants. Recommended pt use built in shower seat during shower to assist in maintaining back precautions while bathing.  Pt able to simulate walk in shower transfer with min HHA assist to "step over".       OT Diagnosis: Acute pain;Generalized weakness  OT Problem List: Decreased strength;Pain;Decreased knowledge of precautions;Decreased knowledge of use of DME or AE OT Treatment Interventions: Self-care/ADL training;DME and/or AE instruction;Patient/family education;Therapeutic activities   OT Goals Acute Rehab OT Goals OT Goal Formulation: With patient Time For Goal Achievement: 12/01/12 Potential to Achieve Goals: Good ADL Goals Pt Will Perform Grooming: with modified independence;Standing at sink ADL Goal: Grooming - Progress: Goal set today Pt Will Transfer to Toilet: with modified independence;Ambulation;Regular height toilet;Maintaining back safety precautions ADL Goal: Toilet Transfer - Progress: Goal set today Pt Will Perform Tub/Shower Transfer: Shower transfer;with modified independence;Ambulation;Shower seat with back;Maintaining back safety precautions ADL Goal: Tub/Shower Transfer - Progress: Goal set today Miscellaneous OT Goals Miscellaneous OT Goal #1: Pt will perform bed mobility at mod I level with HOB flat using log roll technique. OT Goal: Miscellaneous Goal #1 - Progress: Goal set today  Visit Information  Last OT Received On: 11/24/12 Assistance Needed: +1 PT/OT Co-Evaluation/Treatment: Yes    Subjective Data      Prior Functioning     Home Living Lives With: Spouse Available Help at Discharge: Family;Available 24 hours/day Type of Home: House Home Access: Stairs to enter [employer] of Steps: 2 Entrance Stairs-Rails: None Home Layout: Two level;Able to live on main level with bedroom/bathroom Alternate Level Stairs-Number of Steps: 16 Alternate Level Stairs-Rails: Left;Right;Can reach both Bathroom Shower/Tub: Therapist, art: Standard Home Adaptive Equipment: Built-in shower seat Prior Function Level of Independence: Independent Able to Take Stairs?: Yes Driving: Yes Vocation: Retired Musician: No difficulties Dominant Hand: Right  Vision/Perception     Cognition  Overall Cognitive Status: Appears within functional limits for tasks assessed/performed Arousal/Alertness: Awake/alert Orientation Level: Oriented X4 / Intact Behavior During Session: WFL for tasks performed    Extremity/Trunk Assessment Right Upper Extremity Assessment RUE ROM/Strength/Tone: Within functional levels RUE Sensation: WFL - Light Touch Left Upper Extremity Assessment LUE ROM/Strength/Tone: Within functional levels LUE Sensation: WFL - Light Touch Right Lower Extremity Assessment RLE ROM/Strength/Tone: Within functional levels RLE Sensation: WFL - Light Touch Left Lower Extremity Assessment LLE ROM/Strength/Tone: Deficits (grossly 4/5) Trunk Assessment Trunk Assessment: Normal     Mobility Bed Mobility Bed Mobility: Rolling Left;Sit to Sidelying Left;Left Sidelying to Sit Rolling Left: 5: Supervision Left Sidelying to Sit: 5: Supervision Sit to Sidelying Left: 5: Supervision Details for Bed Mobility Assistance: v/c's for technique, increased time Transfers Transfers: Sit to Stand;Stand to Sit Sit to Stand: 5: Supervision;With upper extremity assist;From bed Stand to Sit: 5: Supervision;With upper extremity assist;To chair/3-in-1 Details for Transfer Assistance: increased time, guarded posture, v/c's to limited trunk flexion     Shoulder Instructions     Exercise     Balance     End of Session OT - End of Session Equipment Utilized During Treatment: Gait belt Activity Tolerance: Patient tolerated treatment well Patient left: in chair;with call bell/phone within reach;with family/visitor present Nurse Communication: Mobility status  GO   11/24/2012 Cipriano Mile OTR/L Pager [phone] Office [phone]   Cipriano Mile 11/24/2012, 9:10 AM

## 2012-11-24 NOTE — Progress Notes (Signed)
Pt has received his flu and pneumonia vaccinations. Flu and pneumonia vaccination information/handout has been given to pt.------Melodie Ashworth, rn

## 2012-11-24 NOTE — Op Note (Signed)
Brad Cruz, Brad Cruz NO.:  [id number]  MEDICAL RECORD NO.:  [id number]  LOCATION:  [id number]                        FACILITY:  [facility]  PHYSICIAN:  Hilda Lias, M.D.   DATE OF BIRTH:  May 09, 1946  DATE OF PROCEDURE:  11/23/2012 DATE OF DISCHARGE:                              OPERATIVE REPORT   PREOPERATIVE DIAGNOSIS:  Left L5-S1 herniated disk with a free fragment, degenerative disk disease, L5-S1.  POSTOPERATIVE DIAGNOSIS:  Left L5-S1 herniated disk with a free fragment, degenerative disk disease, L5-S1.  PROCEDURE:  Left L5-S1 diskectomy, removal of free fragment, decompression of the L5 and S1 nerve root.  Microscope.  SURGEON:  Hilda Lias, M.D.  ASSISTANT:  Danae Orleans. Venetia Maxon, M.D.  CLINICAL HISTORY:  The patient is a 67 year old gentleman who had been complaining of back pain for several weeks.  Back in December, he had an MRI which showed degenerative disk disease at the level of L5-S1 with a herniated disk centered to the left.  The patient declined surgery when he was seen by his medical doctor.  He has chiropractic manipulation, medication, and he is no better by the time I saw him.  He was seen by me in my office yesterday, he had 2/5 weakness dorsiflexion of the left foot as well as plantar flexion, and surgery was advised.  He and his wife knew the risk with the surgery.  PROCEDURE:  The patient was taken to the OR, and after intubation, he was positioned in a prone manner.  The back was cleaned with Betadine and DuraPrep.  Drapes were applied.  Midline incision from L5-S1 was made and muscle retracted laterally.  X-rays showed that indeed we were at the level of L5-s1.  We brought the microscope into the area and we drilled the lower lamina of L5 and the upper of S1.  A thick calcified ligament was also excised.  We identified the thecal sac and the patient had quite a bit of adhesion.  Lysis was accomplished and we retracted the thecal sac in  the midline.  There was a large fragment, which was removed.  Then, the patient had quite a bit of stenosis compromising mostly the L5 and foraminotomy was accomplished.  We investigated the foramen of L5-S1 and there was no more free fragment.  There was an opening in the disk and we entered the space doing gross diskectomy medial and laterally.  Upon doing Valsalva, we found that towards the midline in the surgical side there was a small opened arachnoid and a single stitch was used.  For that we had to remove part of the medial lamina.  Valsalva up to 50 was negative.  Then, the area was irrigated and closed with Vicryl and Dermabond.          ______________________________ Hilda Lias, M.D.     EB/MEDQ  D:  11/23/2012  T:  11/24/2012  Job:  [id number]

## 2012-11-24 NOTE — Discharge Summary (Signed)
Physician Discharge Summary  Patient ID: Brad Cruz MRN: [id number] DOB/AGE: [birth year] 67 y.o.  Admit date: 11/23/2012 Discharge date: 11/24/2012  Admission Diagnoses:left l5s1 hnp  Discharge Diagnoses: same   Discharged Condition: no pain  Hospital Course: surgery  Consults: none  Significant Diagnostic Studies: mri  Treatments: discectomy  Discharge Exam: Blood pressure 112/60, pulse 84, temperature 98.2 F (36.8 C), temperature source Oral, resp. rate 18, height 5\' 9"  (1.753 m), weight 81.2 kg (179 lb 0.2 oz), SpO2 96.00%. Normal strength, ambulating  Disposition: home. To see me in 3 weeks     Medication List     As of 11/24/2012  2:46 PM    ASK your doctor about these medications         acetaminophen 500 MG tablet   Commonly known as: TYLENOL   Take 1,000 mg by mouth every 6 (six) hours as needed. For pain      aspirin 81 MG EC tablet   Take 81 mg by mouth daily.      COQ-10 PO   Take 1 capsule by mouth every evening.      diazepam 5 MG tablet   Commonly known as: VALIUM   Take 5 mg by mouth daily as needed. For pain      GROUND FLAX SEEDS Powd   Take 15 mLs by mouth 2 (two) times daily.      nitroGLYCERIN 0.4 MG SL tablet   Commonly known as: NITROSTAT   Place 0.4 mg under the tongue every 5 (five) minutes x 3 doses as needed. For chest pain      oxyCODONE 15 MG immediate release tablet   Commonly known as: ROXICODONE   Take 15 mg by mouth every 8 (eight) hours as needed.      predniSONE 5 MG tablet   Commonly known as: DELTASONE   Take 5 mg by mouth daily.      PSYLLIUM PO   Take 15 mLs by mouth 2 (two) times daily.      rosuvastatin 5 MG tablet   Commonly known as: CRESTOR   Take 5 mg by mouth daily.      Saw Palmetto (Serenoa repens) 320 MG Caps   Take 1 capsule by mouth every evening.         Signed: Karn Cassis 11/24/2012, 2:46 PM

## 2012-11-24 NOTE — Evaluation (Signed)
Physical Therapy Evaluation Patient Details Name: Brad Cruz MRN: [id number] DOB: September 06, 1946 Today's Date: 11/24/2012 Time: 4098-1191 PT Time Calculation (min): 46 min  PT Assessment / Plan / Recommendation Clinical Impression  Pt 67 yo male s/p lumbar lami/microdiscetomy tolerating tx very well. Pt safe to d/c home with assist of spouse when medically ready per MD. Pt with no further skilled PT need post d/c. Acute PT to follow to maximize functional mobility.    PT Assessment  Patient needs continued PT services    Follow Up Recommendations  Supervision/Assistance - 24 hour;No PT follow up    Does the patient have the potential to tolerate intense rehabilitation      Barriers to Discharge        Equipment Recommendations  None recommended by PT    Recommendations for Other Services     Frequency Min 4X/week    Precautions / Restrictions Precautions Precautions: Back;Fall Precaution Booklet Issued: Yes (comment) Precaution Comments: pt and spouse educated Restrictions Weight Bearing Restrictions: No   Pertinent Vitals/Pain 4/10 soreness at surgical site      Mobility  Bed Mobility Bed Mobility: Rolling Left;Sit to Sidelying Left;Left Sidelying to Sit Rolling Left: 5: Supervision Left Sidelying to Sit: 5: Supervision Sit to Sidelying Left: 5: Supervision Details for Bed Mobility Assistance: v/c's for technique, increased time Transfers Transfers: Sit to Stand;Stand to Sit Sit to Stand: 5: Supervision;With upper extremity assist;From bed Stand to Sit: 5: Supervision;With upper extremity assist;To chair/3-in-1 Details for Transfer Assistance: increased time, guarded posture, v/c's to limited trunk flexion Ambulation/Gait Ambulation/Gait Assistance: 4: Min guard Ambulation Distance (Feet): 300 Feet Assistive device: None Ambulation/Gait Assistance Details: guarded/cautious, slow, no obvious limp, v/c's to achieve full upright position Gait Pattern:  Step-through pattern;Decreased stride length Gait velocity: slow Stairs: Yes Stairs Assistance: 4: Min Editor, commissioning Details (indicate cue type and reason): minA via HHA to mimic home set up without rail, wife also returned demo'd safe technique Stair Management Technique: No rails;Forwards Number of Stairs: 4     Shoulder Instructions     Exercises     PT Diagnosis: Difficulty walking;Acute pain  PT Problem List: Decreased strength;Decreased balance;Decreased mobility PT Treatment Interventions: Gait training;Stair training;Functional mobility training;Therapeutic activities;Therapeutic exercise   PT Goals Acute Rehab PT Goals PT Goal Formulation: With patient/family Time For Goal Achievement: 12/01/12 Potential to Achieve Goals: Good Pt will Roll Supine to Left Side: with modified independence PT Goal: Rolling Supine to Left Side - Progress: Goal set today Pt will go Supine/Side to Sit: with modified independence;with HOB 0 degrees PT Goal: Supine/Side to Sit - Progress: Goal set today Pt will go Sit to Supine/Side: with modified independence;with HOB 0 degrees PT Goal: Sit to Supine/Side - Progress: Goal set today Pt will go Sit to Stand: with modified independence PT Goal: Sit to Stand - Progress: Goal set today Pt will Ambulate: >150 feet;Independently (none) PT Goal: Ambulate - Progress: Goal set today Pt will Go Up / Down Stairs: Flight;with modified independence;with rail(s) (on L) PT Goal: Up/Down Stairs - Progress: Goal set today Additional Goals Additional Goal #1: Pt independent with recall of back precautions and 100% compliance. PT Goal: Additional Goal #1 - Progress: Goal set today  Visit Information  Last PT Received On: 11/24/12 Assistance Needed: +1    Subjective Data  Subjective: Pt received sitting up at EOB eating breakfast Patient Stated Goal: home   Prior Functioning  Home Living Lives With: Spouse Available Help at Discharge:  Family;Available 24  hours/day Type of Home: House Home Access: Stairs to enter [employer] of Steps: 2 Entrance Stairs-Rails: None Home Layout: Two level;Able to live on main level with bedroom/bathroom Alternate Level Stairs-Number of Steps: 16 Alternate Level Stairs-Rails: Left;Right;Can reach both Bathroom Shower/Tub: Health visitor: Standard Home Adaptive Equipment: Built-in shower seat Prior Function Level of Independence: Independent Able to Take Stairs?: Yes Driving: Yes Vocation: Retired Musician: No difficulties Dominant Hand: Right    Cognition  Overall Cognitive Status: Appears within functional limits for tasks assessed/performed Arousal/Alertness: Awake/alert Orientation Level: Oriented X4 / Intact Behavior During Session: WFL for tasks performed    Extremity/Trunk Assessment Right Upper Extremity Assessment RUE ROM/Strength/Tone: Within functional levels RUE Sensation: WFL - Light Touch Left Upper Extremity Assessment LUE ROM/Strength/Tone: Within functional levels LUE Sensation: WFL - Light Touch Right Lower Extremity Assessment RLE ROM/Strength/Tone: Within functional levels RLE Sensation: WFL - Light Touch Left Lower Extremity Assessment LLE ROM/Strength/Tone: Deficits (grossly 4/5) Trunk Assessment Trunk Assessment: Normal   Balance    End of Session PT - End of Session Equipment Utilized During Treatment: Gait belt Activity Tolerance: Patient tolerated treatment well Patient left: in bed;with call bell/phone within reach;with family/visitor present Nurse Communication: Mobility status  GP     Marcene Brawn 11/24/2012, 9:05 AM  Lewis Shock, PT, DPT Pager #: [phone] Office #: [phone]

## 2012-11-25 NOTE — Care Management Note (Signed)
    Page 1 of 1   11/25/2012     9:05:30 AM   CARE MANAGEMENT NOTE 11/25/2012  Patient:  Brad Cruz, Brad Cruz   Account Number:  [id number]  Date Initiated:  11/24/2012  Documentation initiated by:  [facility]  Subjective/Objective Assessment:   admitted postop L5-S1 laminectomy, discectomy,removal of fragment     Action/Plan:   PT/OT evals- no follow up or equipment needs identified   Anticipated DC Date:  11/25/2012   Anticipated DC Plan:  HOME/SELF CARE      DC Planning Services  CM consult      Choice offered to / List presented to:             Status of service:  Completed, signed off Medicare Important Message given?   (If response is "NO", the following Medicare IM given date fields will be blank) Date Medicare IM given:   Date Additional Medicare IM given:    Discharge Disposition:  HOME/SELF CARE  Per UR Regulation:  Reviewed for med. necessity/level of care/duration of stay  If discussed at Long Length of Stay Meetings, dates discussed:    Comments:

## 2012-11-26 ENCOUNTER — Encounter (HOSPITAL_COMMUNITY): Payer: Self-pay | Admitting: Neurosurgery

## 2013-01-31 ENCOUNTER — Ambulatory Visit (INDEPENDENT_AMBULATORY_CARE_PROVIDER_SITE_OTHER): Payer: Medicare Other | Admitting: Cardiology

## 2013-01-31 ENCOUNTER — Encounter: Payer: Self-pay | Admitting: Cardiology

## 2013-01-31 VITALS — BP 116/74 | HR 71 | Ht 70.0 in | Wt 187.0 lb

## 2013-01-31 DIAGNOSIS — I2510 Atherosclerotic heart disease of native coronary artery without angina pectoris: Secondary | ICD-10-CM

## 2013-01-31 DIAGNOSIS — E785 Hyperlipidemia, unspecified: Secondary | ICD-10-CM

## 2013-01-31 NOTE — Patient Instructions (Addendum)
Your physician recommends that you schedule a follow-up appointment in: 6 months with MD SM   

## 2013-01-31 NOTE — Assessment & Plan Note (Signed)
He continues on Crestor 5 mg daily. Will request most recent lipid panel for review.

## 2013-01-31 NOTE — Assessment & Plan Note (Signed)
Symptomatically stable on medical therapy, overall nonobstructive disease a catheterization last year. Continue observation. Aerobic exercise as tolerated. ECG reviewed and normal today.

## 2013-01-31 NOTE — Progress Notes (Signed)
Clinical Summary [name] is a 67 y.o.male last seen in September 2013 by [name] in the [place] office. He is being treated medically for overall nonobstructive CAD documented at cardiac catheterization last year.   Record review finds that he underwent a lumbar discectomy in January, still sees Dr. Jeral Cruz. His ambulation is limited, he complains of neuropathic left leg pain. No obvious cardiac complications with the surgery.  ECG today shows normal sinus rhythm.  He reports compliance to his medications. States he had a recent lipid panel with Dr. Sherril Cruz.  Allergies  Allergen Reactions  . Cats Claw (Uncaria Tomentosa (Cats Claw)) Other (See Comments)    asthma  . Nsaids Itching    Possibly other NSAID'S, but ibuprofen for sure.   Marland Kitchen Shellfish-Derived Products Other (See Comments)    unknown  . Ibuprofen Hives, Swelling and Rash    Current Outpatient Prescriptions  Medication Sig Dispense Refill  . acetaminophen (TYLENOL) 500 MG tablet Take 1,000 mg by mouth every 6 (six) hours as needed. For pain      . aspirin 81 MG EC tablet Take 81 mg by mouth daily.      . Coenzyme Q10 (COQ-10 PO) Take 1 capsule by mouth every evening.      . diazepam (VALIUM) 5 MG tablet Take 5 mg by mouth daily as needed. For pain      . Flaxseed, Linseed, (GROUND FLAX SEEDS) POWD Take 15 mLs by mouth 2 (two) times daily.      Marland Kitchen gabapentin (NEURONTIN) 300 MG capsule Take 300 mg by mouth 3 (three) times daily.      . nitroGLYCERIN (NITROSTAT) 0.4 MG SL tablet Place 0.4 mg under the tongue every 5 (five) minutes x 3 doses as needed. For chest pain      . PSYLLIUM PO Take 15 mLs by mouth 2 (two) times daily.      . rosuvastatin (CRESTOR) 5 MG tablet Take 5 mg by mouth daily.       . Saw Palmetto, Serenoa repens, 320 MG CAPS Take 1 capsule by mouth every evening.       No current facility-administered medications for this visit.    Past Medical History  Diagnosis Date  . Coronary atherosclerosis of native  coronary artery     a. 06/2012 Myoview:  No ischemia;  b. 06/25/2012 Cath:  LM nl, LAD 50p, 74m, D1 80 ost (small), D2 nl (small), LCX 30 ost, OM1 nl, RI 30 ost, RCA 50-60- ost, 24m, PDA minor irregs, PL nl (small), EF 65%.   . Hyperlipidemia   . GERD (gastroesophageal reflux disease)   . Vasovagal syncope   . Pulmonary granuloma     Chest CT 6/13  . Asthma     Childhood history  . Migraines   . BPH (benign prostatic hyperplasia)   . Skin cancer, basal cell     Social History Brad Cruz reports that he has never smoked. He does not have any smokeless tobacco history on file. Brad Cruz reports that he does not drink alcohol.  Review of Systems No regular angina symptoms, no palpitations or syncope. Otherwise negative except as outlined.  Physical Examination Filed Vitals:   01/31/13 1520  BP: 116/74  Pulse: 71   Filed Weights   01/31/13 1520  Weight: 187 lb (84.823 kg)   No acute distress. HEENT: Conjunctiva and lids normal, oropharynx clear with moist mucosa. Neck: Supple, no elevated JVP or carotid bruits, no thyromegaly. Lungs: Clear to auscultation,  nonlabored breathing at rest. Cardiac: Regular rate and rhythm, no S3 or significant systolic murmur, no pericardial rub. Extremities: No pitting edema, distal pulses 2+. Skin: Warm and dry.   Problem List and Plan   CAD (coronary artery disease) Symptomatically stable on medical therapy, overall nonobstructive disease a catheterization last year. Continue observation. Aerobic exercise as tolerated. ECG reviewed and normal today.  Hyperlipidemia He continues on Crestor 5 mg daily. Will request most recent lipid panel for review.    Jonelle Sidle, M.D., F.A.C.C.

## 2013-02-08 ENCOUNTER — Encounter: Payer: Self-pay | Admitting: Cardiology

## 2013-02-10 ENCOUNTER — Other Ambulatory Visit: Payer: Self-pay | Admitting: Neurosurgery

## 2013-02-10 DIAGNOSIS — M5410 Radiculopathy, site unspecified: Secondary | ICD-10-CM

## 2013-02-15 ENCOUNTER — Ambulatory Visit: Payer: Medicare Other | Admitting: Cardiology

## 2013-02-16 ENCOUNTER — Ambulatory Visit
Admission: RE | Admit: 2013-02-16 | Discharge: 2013-02-16 | Disposition: A | Discharge status: Home / Self Care | Payer: Medicare Other | Source: Ambulatory Visit | Attending: Neurosurgery | Admitting: Neurosurgery

## 2013-02-16 DIAGNOSIS — M5410 Radiculopathy, site unspecified: Secondary | ICD-10-CM

## 2013-02-16 MED ORDER — GADOBENATE DIMEGLUMINE 529 MG/ML IV SOLN
17.0000 mL | Freq: Once | INTRAVENOUS | Status: AC | PRN
  Administered 2013-02-16: 17 mL via INTRAVENOUS

## 2013-07-26 ENCOUNTER — Ambulatory Visit (INDEPENDENT_AMBULATORY_CARE_PROVIDER_SITE_OTHER): Payer: Medicare Other | Admitting: Cardiology

## 2013-07-26 ENCOUNTER — Encounter: Payer: Self-pay | Admitting: Cardiology

## 2013-07-26 VITALS — BP 125/74 | HR 64 | Ht 70.0 in | Wt 191.8 lb

## 2013-07-26 DIAGNOSIS — E785 Hyperlipidemia, unspecified: Secondary | ICD-10-CM

## 2013-07-26 DIAGNOSIS — I2510 Atherosclerotic heart disease of native coronary artery without angina pectoris: Secondary | ICD-10-CM

## 2013-07-26 NOTE — Patient Instructions (Addendum)
Your physician recommends that you schedule a follow-up appointment in: 6 months You will receive a reminder letter two months in advance reminding you to call and schedule your appointment. If you don't receive this letter, please contact our office.  Your physician recommends that you continue on your current medications as directed. Please refer to the Current Medication list given to you today.     

## 2013-07-26 NOTE — Assessment & Plan Note (Signed)
Symptomatically stable with history of nonobstructive disease. Continue medical therapy and observation.

## 2013-07-26 NOTE — Assessment & Plan Note (Signed)
Continue on Crestor, follow up with Dr. Sherril Croon.

## 2013-07-26 NOTE — Progress Notes (Signed)
Clinical Summary Brad Cruz is a 67 y.o.male last seen in March. He is here with his wife today. He reports no angina symptoms, no unusual shortness of breath. Tolerating his current medical regimen including Crestor. He tells me that his cholesterol numbers have improved significantly.  ECG today shows normal sinus rhythm, no significant ST segment changes. 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   Allergies  Allergen Reactions  . Cats Claw [Uncaria Tomentosa (Cats Claw)] Other (See Comments)    asthma  . Citrus     hives  . Dairy Aid [Lactase]     Scalp itches/face burns  . Nsaids Itching    Possibly other NSAID'S, but ibuprofen for sure.   Marland Kitchen Shellfish-Derived Products Other (See Comments)    unknown  . Ibuprofen Hives, Swelling and Rash    Current Outpatient Prescriptions  Medication Sig Dispense Refill  . acetaminophen (TYLENOL) 500 MG tablet Take 1,000 mg by mouth every 6 (six) hours as needed. For pain      . aspirin 81 MG tablet Take 81 mg by mouth daily.      . Coenzyme Q10 (COQ-10 PO) Take 1 capsule by mouth every evening.      . nitroGLYCERIN (NITROSTAT) 0.4 MG SL tablet Place 0.4 mg under the tongue every 5 (five) minutes x 3 doses as needed. For chest pain      . PSYLLIUM PO Take 15 mLs by mouth 2 (two) times daily.      . rosuvastatin (CRESTOR) 5 MG tablet Take 5 mg by mouth daily.       . Saw Palmetto, Serenoa repens, 320 MG CAPS Take 1 capsule by mouth every evening.       No current facility-administered medications for this visit.    Past Medical History  Diagnosis Date  . Coronary atherosclerosis of native coronary artery     a. 06/2012 Myoview:  No ischemia;  b. 06/25/2012 Cath:  LM nl, LAD 50p, 41m, D1 80 ost (small), D2 nl (small), LCX 30 ost, OM1 nl, RI 30 ost, RCA 50-60- ost, 63m, PDA minor irregs, PL nl (small), EF 65%.   . Hyperlipidemia   . GERD (gastroesophageal reflux disease)   .  Vasovagal syncope   . Pulmonary granuloma     Chest CT 6/13  . Asthma     Childhood history  . Migraines   . BPH (benign prostatic hyperplasia)   . Skin cancer, basal cell     Social History Brad Cruz reports that he has never smoked. He does not have any smokeless tobacco history on file. Brad Cruz reports that he does not drink alcohol.  Review of Systems Negative except as outlined.  Physical Examination Filed Vitals:   07/26/13 1319  BP: 125/74  Pulse: 64   Filed Weights   07/26/13 1319  Weight: 191 lb 12 oz (86.977 kg)    No acute distress.  HEENT: Conjunctiva and lids normal, oropharynx clear with moist mucosa.  Neck: Supple, no elevated JVP or carotid bruits, no thyromegaly.  Lungs: Clear to auscultation, nonlabored breathing at rest.  Cardiac: Regular rate and rhythm, no S3 or significant systolic murmur, no pericardial rub.  Extremities: No pitting edema, distal pulses 2+.  Skin: Warm and dry.   Problem List and Plan   CAD (coronary artery disease) Symptomatically stable with history of nonobstructive disease. Continue medical therapy and observation.  Hyperlipidemia Continue on Crestor, follow up with Dr.  Denton Lank, M.D., F.A.C.C.

## 2014-01-13 ENCOUNTER — Encounter: Payer: Self-pay | Admitting: Internal Medicine

## 2014-01-24 ENCOUNTER — Ambulatory Visit: Payer: Medicare Other | Admitting: Cardiology

## 2014-01-27 ENCOUNTER — Ambulatory Visit: Payer: Medicare Other | Admitting: Cardiology

## 2014-02-21 ENCOUNTER — Ambulatory Visit (INDEPENDENT_AMBULATORY_CARE_PROVIDER_SITE_OTHER): Payer: Medicare Other | Admitting: Cardiology

## 2014-02-21 ENCOUNTER — Encounter: Payer: Self-pay | Admitting: Cardiology

## 2014-02-21 VITALS — BP 127/70 | HR 70 | Ht 69.0 in | Wt 189.0 lb

## 2014-02-21 DIAGNOSIS — E785 Hyperlipidemia, unspecified: Secondary | ICD-10-CM

## 2014-02-21 DIAGNOSIS — I2510 Atherosclerotic heart disease of native coronary artery without angina pectoris: Secondary | ICD-10-CM

## 2014-02-21 NOTE — Progress Notes (Signed)
Clinical Summary Mr. Hark is a 68 y.o.male last seen in September 2014. He is here with his wife today. Reports doing well from a cardiac perspective, walks for exercise without exertional angina or unusual shortness of breath. He has not required any nitroglycerin.  Continues to follow lipids with Dr. Woody Seller, has had no change in low-dose Crestor. He is tolerating this medication.  Only new symptom is reflux which has been managed with Nexium with improvement.   Allergies  Allergen Reactions  . Cats Claw [Uncaria Tomentosa (Cats Claw)] Other (See Comments)    asthma  . Citrus     hives  . Dairy Aid [Lactase]     Scalp itches/face burns  . Nsaids Itching    Possibly other NSAID'S, but ibuprofen for sure.   Marland Kitchen Shellfish-Derived Products Other (See Comments)    unknown  . Ibuprofen Hives, Swelling and Rash    Current Outpatient Prescriptions  Medication Sig Dispense Refill  . aspirin 81 MG tablet Take 81 mg by mouth daily.      . Coenzyme Q10 (COQ-10 PO) Take 1 capsule by mouth every evening.      Marland Kitchen esomeprazole (NEXIUM) 20 MG capsule Take 20 mg by mouth daily at 12 noon.      . rosuvastatin (CRESTOR) 5 MG tablet Take 5 mg by mouth daily.       . Saw Palmetto, Serenoa repens, 320 MG CAPS Take 1 capsule by mouth every evening.      . nitroGLYCERIN (NITROSTAT) 0.4 MG SL tablet Place 0.4 mg under the tongue every 5 (five) minutes x 3 doses as needed. For chest pain       No current facility-administered medications for this visit.    Past Medical History  Diagnosis Date  . Coronary atherosclerosis of native coronary artery     a. 06/2012 Myoview:  No ischemia;  b. 06/25/2012 Cath:  LM nl, LAD 50p, 68m, D1 80 ost (small), D2 nl (small), LCX 30 ost, OM1 nl, RI 30 ost, RCA 50-60- ost, 28m, PDA minor irregs, PL nl (small), EF 65%.   . Hyperlipidemia   . GERD (gastroesophageal reflux disease)   . Vasovagal syncope   . Pulmonary granuloma     Chest CT 6/13  . Asthma     Childhood  history  . Migraines   . BPH (benign prostatic hyperplasia)   . Skin cancer, basal cell     Social History Mr. Mineau reports that he has never smoked. He does not have any smokeless tobacco history on file. Mr. Banks reports that he does not drink alcohol.  Review of Systems No palpitations or syncope. No orthopnea or PND. No claudication. Otherwise negative.  Physical Examination Filed Vitals:   02/21/14 1149  BP: 127/70  Pulse: 70   Filed Weights   02/21/14 1149  Weight: 189 lb (85.73 kg)    No acute distress.  HEENT: Conjunctiva and lids normal, oropharynx clear with moist mucosa.  Neck: Supple, no elevated JVP or carotid bruits, no thyromegaly.  Lungs: Clear to auscultation, nonlabored breathing at rest.  Cardiac: Regular rate and rhythm, no S3 or significant systolic murmur, no pericardial rub.  Extremities: No pitting edema, distal pulses 2+.  Skin: Warm and dry.   Problem List and Plan   CAD (coronary artery disease) Symptomatically stable on medical therapy. Continue walking regimen for exercise. No change in current regimen.  Hyperlipidemia Continues on low-dose Crestor, lipids followed by Dr. Woody Seller. Patient recalls his recent total  cholesterol being 145.    Satira Sark, M.D., F.A.C.C.

## 2014-02-21 NOTE — Assessment & Plan Note (Signed)
Symptomatically stable on medical therapy. Continue walking regimen for exercise. No change in current regimen.

## 2014-02-21 NOTE — Assessment & Plan Note (Signed)
Continues on low-dose Crestor, lipids followed by Dr. Woody Seller. Patient recalls his recent total cholesterol being 145.

## 2014-02-21 NOTE — Patient Instructions (Signed)
Your physician wants you to follow-up in: 6 months You will receive a reminder letter in the mail two months in advance. If you don't receive a letter, please call our office to schedule the follow-up appointment.     Your physician recommends that you continue on your current medications as directed. Please refer to the Current Medication list given to you today.      Thank you for choosing [facility] !        

## 2014-03-07 ENCOUNTER — Ambulatory Visit (AMBULATORY_SURGERY_CENTER): Payer: Self-pay | Admitting: *Deleted

## 2014-03-07 VITALS — Ht 69.0 in | Wt 190.0 lb

## 2014-03-07 DIAGNOSIS — Z1211 Encounter for screening for malignant neoplasm of colon: Secondary | ICD-10-CM

## 2014-03-07 MED ORDER — NA SULFATE-K SULFATE-MG SULF 17.5-3.13-1.6 GM/177ML PO SOLN: 1.0000 | Freq: Once | ORAL | Status: DC

## 2014-03-07 NOTE — Progress Notes (Signed)
No egg or soy allergy. No anesthesia problems.  No home O2.  No diet meds.  

## 2014-03-21 ENCOUNTER — Encounter: Payer: Self-pay | Admitting: Internal Medicine

## 2014-03-21 ENCOUNTER — Ambulatory Visit (AMBULATORY_SURGERY_CENTER): Payer: Medicare Other | Admitting: Internal Medicine

## 2014-03-21 VITALS — BP 120/82 | HR 65 | Temp 96.9°F | Resp 16 | Ht 69.0 in | Wt 190.0 lb

## 2014-03-21 DIAGNOSIS — K5730 Diverticulosis of large intestine without perforation or abscess without bleeding: Secondary | ICD-10-CM

## 2014-03-21 DIAGNOSIS — D126 Benign neoplasm of colon, unspecified: Secondary | ICD-10-CM

## 2014-03-21 DIAGNOSIS — D129 Benign neoplasm of anus and anal canal: Secondary | ICD-10-CM

## 2014-03-21 DIAGNOSIS — Z1211 Encounter for screening for malignant neoplasm of colon: Secondary | ICD-10-CM

## 2014-03-21 DIAGNOSIS — D128 Benign neoplasm of rectum: Secondary | ICD-10-CM

## 2014-03-21 MED ORDER — SODIUM CHLORIDE 0.9 % IV SOLN: 500.0000 mL | INTRAVENOUS | Status: DC

## 2014-03-21 NOTE — Progress Notes (Signed)
Called to room to assist during endoscopic procedure.  Patient ID and intended procedure confirmed with present staff. Received instructions for my participation in the procedure from the performing physician.  

## 2014-03-21 NOTE — Progress Notes (Signed)
Report to pacu rn, vss, bbs=clear 

## 2014-03-21 NOTE — Patient Instructions (Addendum)
I found and removed one tiny polyp (or possible polyp). You also have a condition called diverticulosis - common and not usually a problem. Please read the handout provided.  I will let you know pathology results and when to have another routine colonoscopy by mail.  I appreciate the opportunity to care for you. Gatha Mayer, MD, FACG   YOU HAD AN ENDOSCOPIC PROCEDURE TODAY AT [facility]: Refer to the procedure report that was given to you for any specific questions about what was found during the examination.  If the procedure report does not answer your questions, please call your gastroenterologist to clarify.  If you requested that your care partner not be given the details of your procedure findings, then the procedure report has been included in a sealed envelope for you to review at your convenience later.  YOU SHOULD EXPECT: Some feelings of bloating in the abdomen. Passage of more gas than usual.  Walking can help get rid of the air that was put into your GI tract during the procedure and reduce the bloating. If you had a lower endoscopy (such as a colonoscopy or flexible sigmoidoscopy) you may notice spotting of blood in your stool or on the toilet paper. If you underwent a bowel prep for your procedure, then you may not have a normal bowel movement for a few days.  DIET: Your first meal following the procedure should be a light meal and then it is ok to progress to your normal diet.  A half-sandwich or bowl of soup is an example of a good first meal.  Heavy or fried foods are harder to digest and may make you feel nauseous or bloated.  Likewise meals heavy in dairy and vegetables can cause extra gas to form and this can also increase the bloating.  Drink plenty of fluids but you should avoid alcoholic beverages for 24 hours.  ACTIVITY: Your care partner should take you home directly after the procedure.  You should plan to take it easy, moving slowly for the rest of the  day.  You can resume normal activity the day after the procedure however you should NOT DRIVE or use heavy machinery for 24 hours (because of the sedation medicines used during the test).    SYMPTOMS TO REPORT IMMEDIATELY: A gastroenterologist can be reached at any hour.  During normal business hours, 8:30 AM to 5:00 PM Monday through Friday, call [phone].  After hours and on weekends, please call the GI answering service at [phone] who will take a message and have the physician on call contact you.   Following lower endoscopy (colonoscopy or flexible sigmoidoscopy):  Excessive amounts of blood in the stool  Significant tenderness or worsening of abdominal pains  Swelling of the abdomen that is new, acute  Fever of 100F or higher   FOLLOW UP: If any biopsies were taken you will be contacted by phone or by letter within the next 1-3 weeks.  Call your gastroenterologist if you have not heard about the biopsies in 3 weeks.  Our staff will call the home number listed on your records the next business day following your procedure to check on you and address any questions or concerns that you may have at that time regarding the information given to you following your procedure. This is a courtesy call and so if there is no answer at the home number and we have not heard from you through the emergency physician on call, we  will assume that you have returned to your regular daily activities without incident.  SIGNATURES/CONFIDENTIALITY: You and/or your care partner have signed paperwork which will be entered into your electronic medical record.  These signatures attest to the fact that that the information above on your After Visit Summary has been reviewed and is understood.  Full responsibility of the confidentiality of this discharge information lies with you and/or your care-partner.   Resume medications Information given on polyps and diverticulosis with discharge instructions.

## 2014-03-21 NOTE — Op Note (Signed)
[facility]  [address] [place], [id number]   COLONOSCOPY PROCEDURE REPORT  PATIENT: Brad Cruz, Brad Cruz  MR#: [id number] BIRTHDATE: 03/17/1946 , 68  yrs. old GENDER: Male ENDOSCOPIST: Gatha Mayer, MD, [facility] REFERRED EN:IDPOE Woody Seller, M.D. PROCEDURE DATE:  03/21/2014 PROCEDURE:   Colonoscopy with biopsy First Screening Colonoscopy - Avg.  risk and is 50 yrs.  old or older - No.  Prior Negative Screening - Now for repeat screening. 10 or more years since last screening  History of Adenoma - Now for follow-up colonoscopy & has been > or = to 3 yrs.  N/A  Polyps Removed Today? Yes. ASA CLASS:   Class II INDICATIONS:average risk screening and Last colonoscopy performed 10 years ago. MEDICATIONS: Propofol (Diprivan) 220 mg IV, MAC sedation, administered by CRNA, and These medications were titrated to patient response per physician's verbal order  DESCRIPTION OF PROCEDURE:   After the risks benefits and alternatives of the procedure were thoroughly explained, informed consent was obtained.  A digital rectal exam revealed no abnormalities of the rectum.   The LB [id number]  endoscope was introduced through the anus and advanced to the cecum, which was identified by both the appendix and ileocecal valve. No adverse events experienced.   The quality of the prep was excellent using Suprep  The instrument was then slowly withdrawn as the colon was fully examined.      COLON FINDINGS: A sessile polyp measuring 2 mm in size was found at the cecum.  A polypectomy was performed with cold forceps.  The resection was complete and the polyp tissue was completely retrieved.   Moderate diverticulosis was noted in the sigmoid colon.   The colon mucosa was otherwise normal.  Retroflexed views revealed no abnormalities. The time to cecum=2 minutes 25 seconds. Withdrawal time=8 minutes 10 seconds.  The scope was withdrawn and the procedure completed. COMPLICATIONS: There  were no complications.  ENDOSCOPIC IMPRESSION: 1.   Sessile polyp measuring 2 mm in size was found at the cecum; polypectomy was performed with cold forceps 2.   Moderate diverticulosis was noted in the sigmoid colon 3.   The colon mucosa was otherwise normal - excellent prep  RECOMMENDATIONS: Timing of repeat colonoscopy will be determined by pathology findings.   eSigned:  Gatha Mayer, MD, [facility] 03/21/2014 10:37 AM   cc: Norina Buzzard, MD and The Patient

## 2014-03-22 ENCOUNTER — Telehealth: Payer: Self-pay | Admitting: *Deleted

## 2014-03-22 NOTE — Telephone Encounter (Signed)
  Follow up Call-  Call back number 03/21/2014  Post procedure Call Back phone  # [phone]  Permission to leave phone message Yes     Patient questions:  Do you have a fever, pain , or abdominal swelling? no Pain Score  0 *  Have you tolerated food without any problems? yes  Have you been able to return to your normal activities? yes  Do you have any questions about your discharge instructions: Diet   no Medications  no Follow up visit  no  Do you have questions or concerns about your Care? no  Actions: * If pain score is 4 or above: No action needed, pain <4.

## 2014-03-28 ENCOUNTER — Encounter: Payer: Self-pay | Admitting: Internal Medicine

## 2014-03-28 NOTE — Progress Notes (Signed)
Quick Note:  Mucosal polyp - repeat colon 2025 ______

## 2014-08-30 ENCOUNTER — Ambulatory Visit (INDEPENDENT_AMBULATORY_CARE_PROVIDER_SITE_OTHER): Payer: Medicare Other | Admitting: Cardiology

## 2014-08-30 ENCOUNTER — Encounter: Payer: Self-pay | Admitting: Cardiology

## 2014-08-30 VITALS — BP 124/66 | HR 70 | Ht 69.0 in | Wt 183.0 lb

## 2014-08-30 DIAGNOSIS — E785 Hyperlipidemia, unspecified: Secondary | ICD-10-CM

## 2014-08-30 DIAGNOSIS — I2510 Atherosclerotic heart disease of native coronary artery without angina pectoris: Secondary | ICD-10-CM

## 2014-08-30 NOTE — Assessment & Plan Note (Signed)
Symptomatically stable on medical therapy. I recommended that he continue diet and exercise regimen. ECG reviewed and stable.

## 2014-08-30 NOTE — Patient Instructions (Signed)
Your physician wants you to follow-up in: 6 months You will receive a reminder letter in the mail two months in advance. If you don't receive a letter, please call our office to schedule the follow-up appointment.     Your physician recommends that you continue on your current medications as directed. Please refer to the Current Medication list given to you today.      Thank you for choosing [facility] !        

## 2014-08-30 NOTE — Assessment & Plan Note (Signed)
Continues on Crestor, keep followup with Dr. Vyas. 

## 2014-08-30 NOTE — Progress Notes (Signed)
Reason for visit: CAD, hyperlipidemia  Clinical Summary Brad Cruz is a 68 y.o.male last seen in April. He is here with his wife today for a routine visit. Overall he has been doing well. He tells me that he has been exercising most days a week, either walking on a treadmill indoors, or going outside to walk or jog. He reports no angina symptoms, NYHA class 1-2 dyspnea.  ECG today is normal.  Lipid panel from last year with Brad Cruz showed cholesterol 169, triglycerides 163, HDL 38, and LDL 98.  We reviewed his medications. He continues on aspirin and statin therapy, has not required any nitroglycerin. He has been trying to watch his diet, weight is down 7 pounds from May.   Allergies  Allergen Reactions  . Cats Claw [Uncaria Tomentosa (Cats Claw)] Other (See Comments)    asthma  . Citrus     hives  . Dairy Aid [Lactase]     Scalp itches/face burns  . Nsaids Itching    Possibly other NSAID'S, but ibuprofen for sure.   Marland Kitchen Shellfish-Derived Products Other (See Comments)    Rash   . Ibuprofen Hives, Swelling and Rash    Current Outpatient Prescriptions  Medication Sig Dispense Refill  . aspirin 81 MG tablet Take 81 mg by mouth daily.      . Coenzyme Q10 (COQ-10 PO) Take 1 capsule by mouth every evening.      Marland Kitchen esomeprazole (NEXIUM) 20 MG capsule Take 20 mg by mouth daily at 12 noon.      . psyllium (METAMUCIL) 58.6 % powder Take 1 packet by mouth 3 (three) times daily.      . rosuvastatin (CRESTOR) 5 MG tablet Take 5 mg by mouth daily.       . Saw Palmetto, Serenoa repens, 320 MG CAPS Take 1 capsule by mouth every evening.      . nitroGLYCERIN (NITROSTAT) 0.4 MG SL tablet Place 0.4 mg under the tongue every 5 (five) minutes x 3 doses as needed. For chest pain       No current facility-administered medications for this visit.    Past Medical History  Diagnosis Date  . Coronary atherosclerosis of native coronary artery     a. 06/2012 Myoview:  No ischemia;  b. 06/25/2012 Cath:   LM nl, LAD 50p, 59m, D1 80 ost (small), D2 nl (small), LCX 30 ost, OM1 nl, RI 30 ost, RCA 50-60- ost, 71m, PDA minor irregs, PL nl (small), EF 65%.   . Hyperlipidemia   . GERD (gastroesophageal reflux disease)   . Vasovagal syncope   . Pulmonary granuloma     Chest CT 6/13  . Asthma     Childhood history  . Migraines   . BPH (benign prostatic hyperplasia)   . Skin cancer, basal cell   . Allergy     mold, cat dander  . Cataract     Social History Mr. Lor reports that he has never smoked. He has never used smokeless tobacco. Mr. Cutbirth reports that he does not drink alcohol.  Review of Systems Complete review of systems negative except as otherwise outlined in the clinical summary and also the following. No orthopnea or PND, no claudication.  Physical Examination Filed Vitals:   08/30/14 1145  BP: 124/66  Pulse: 70   Filed Weights   08/30/14 1145  Weight: 183 lb (83.008 kg)    No acute distress.  HEENT: Conjunctiva and lids normal, oropharynx clear with moist mucosa.  Neck: Supple,  no elevated JVP or carotid bruits, no thyromegaly.  Lungs: Clear to auscultation, nonlabored breathing at rest.  Cardiac: Regular rate and rhythm, no S3 or significant systolic murmur, no pericardial rub.  Extremities: No pitting edema, distal pulses 2+.  Skin: Warm and dry.    Problem List and Plan   CAD (coronary artery disease), native coronary artery Symptomatically stable on medical therapy. I recommended that he continue diet and exercise regimen. ECG reviewed and stable.  Hyperlipidemia Continues on Crestor, keep follow-up with Brad Cruz.    Satira Sark, M.D., F.A.C.C.

## 2014-10-04 ENCOUNTER — Other Ambulatory Visit: Payer: Self-pay | Admitting: Internal Medicine

## 2014-10-04 DIAGNOSIS — R279 Unspecified lack of coordination: Secondary | ICD-10-CM

## 2014-10-12 ENCOUNTER — Encounter (HOSPITAL_COMMUNITY): Payer: Self-pay | Admitting: Cardiology

## 2014-10-17 ENCOUNTER — Ambulatory Visit
Admission: RE | Admit: 2014-10-17 | Discharge: 2014-10-17 | Disposition: A | Discharge status: Home / Self Care | Payer: Medicare Other | Source: Ambulatory Visit | Attending: Internal Medicine | Admitting: Internal Medicine

## 2014-10-17 DIAGNOSIS — R279 Unspecified lack of coordination: Secondary | ICD-10-CM

## 2015-02-06 ENCOUNTER — Telehealth: Payer: Self-pay | Admitting: Cardiology

## 2015-02-06 NOTE — Telephone Encounter (Signed)
Mailed pcp list to patient's home

## 2015-02-06 NOTE — Telephone Encounter (Signed)
Patient wants to know if Dr.McDowell has any recommendations on PCP.Marland Kitchen / tg

## 2015-02-28 ENCOUNTER — Ambulatory Visit (INDEPENDENT_AMBULATORY_CARE_PROVIDER_SITE_OTHER): Payer: Medicare Other | Admitting: Cardiology

## 2015-02-28 ENCOUNTER — Encounter: Payer: Self-pay | Admitting: Cardiology

## 2015-02-28 VITALS — BP 112/70 | HR 68 | Ht 70.0 in | Wt 189.0 lb

## 2015-02-28 DIAGNOSIS — E785 Hyperlipidemia, unspecified: Secondary | ICD-10-CM

## 2015-02-28 DIAGNOSIS — I2510 Atherosclerotic heart disease of native coronary artery without angina pectoris: Secondary | ICD-10-CM | POA: Diagnosis not present

## 2015-02-28 NOTE — Patient Instructions (Signed)
Your physician wants you to follow-up in: 6 months You will receive a reminder letter in the mail two months in advance. If you don't receive a letter, please call our office to schedule the follow-up appointment.   Take crestor 5 mg twice a week     Thank you for choosing [facility] !

## 2015-02-28 NOTE — Progress Notes (Signed)
Cardiology Office Note  Date: 02/28/2015   ID: Brad Cruz, DOB 01/22/1946, MRN [id number]  PCP: Glenda Chroman., MD  Primary Cardiologist: Rozann Lesches, MD   Chief Complaint  Patient presents with  . Coronary Artery Disease  . Hyperlipidemia    History of Present Illness: Brad Cruz is a 69 y.o. male last seen in October 2015. He is here today with his wife for a follow-up visit. From a cardiac perspective, he denies any angina symptoms or increasing shortness of breath. He walks 4 days a week for about 25 minutes at a time. We reviewed his medications. He states that he cut Crestor back to 5 mg every other day, plans to go to twice a week, reporting problems with arthralgias and irritability. Lipid numbers are noted below.  I reviewed his most recent lab work. He is being treated for hypothyroidism by Dr. Woody Seller.  He denies any claudication symptoms, no palpitations or dizziness. States that he is following a low-fat diet.   Past Medical History  Diagnosis Date  . Coronary atherosclerosis of native coronary artery     a. 06/2012 Myoview:  No ischemia;  b. 06/25/2012 Cath:  LM nl, LAD 50p, 23m, D1 80 ost (small), D2 nl (small), LCX 30 ost, OM1 nl, RI 30 ost, RCA 50-60- ost, 11m, PDA minor irregs, PL nl (small), EF 65%.   . Hyperlipidemia   . GERD (gastroesophageal reflux disease)   . Vasovagal syncope   . Pulmonary granuloma     Chest CT 6/13  . Asthma     Childhood history  . Migraines   . BPH (benign prostatic hyperplasia)   . Skin cancer, basal cell   . Allergy     Mold, cat dander  . Cataract     Past Surgical History  Procedure Laterality Date  . Deviated septum repair 1989    . Cardiac catheterization    . Skin cancer excision      Under left eye basal cell  . Lumbar laminectomy/decompression microdiscectomy  11/23/2012    Procedure: LUMBAR LAMINECTOMY/DECOMPRESSION MICRODISCECTOMY 1 LEVEL;  Surgeon: Floyce Stakes, MD;  Location: [facility];   Service: Neurosurgery;  Laterality: Left;  Left Lumbar five-sacral one Diskectomy  . Colonoscopy    . Left heart catheterization with coronary angiogram N/A 06/25/2012    Procedure: LEFT HEART CATHETERIZATION WITH CORONARY ANGIOGRAM;  Surgeon: Minus Breeding, MD;  Location[facility];  Service: Cardiovascular;  Laterality: N/A;    Current Outpatient Prescriptions  Medication Sig Dispense Refill  . Coenzyme Q10 (COQ-10 PO) Take 1 capsule by mouth as needed.     Marland Kitchen levothyroxine (SYNTHROID, LEVOTHROID) 50 MCG tablet Take 50 mcg by mouth daily before breakfast.    . nitroGLYCERIN (NITROSTAT) 0.4 MG SL tablet Place 0.4 mg under the tongue every 5 (five) minutes as needed for chest pain.    Marland Kitchen psyllium (METAMUCIL) 58.6 % powder Take 1 packet by mouth 3 (three) times daily.    . rosuvastatin (CRESTOR) 5 MG tablet Take 5 mg by mouth. Takes 2 times a week    . Saw Palmetto, Serenoa repens, 320 MG CAPS Take 1 capsule by mouth every evening.    Marland Kitchen esomeprazole (NEXIUM) 20 MG capsule Take 20 mg by mouth daily at 12 noon.     No current facility-administered medications for this visit.    Allergies:  Cats claw; Citrus; Dairy aid; Nsaids; Shellfish-derived products; and Ibuprofen   Social History: The patient  reports  that he has never smoked. He has never used smokeless tobacco. He reports that he does not drink alcohol or use illicit drugs.   ROS:  Please see the history of present illness. Otherwise, complete review of systems is positive for none.  All other systems are reviewed and negative.   Physical Exam: VS:  BP 112/70 mmHg  Pulse 68  Ht 5\' 10"  (1.778 m)  Wt 189 lb (85.73 kg)  BMI 27.12 kg/m2  SpO2 97%, BMI Body mass index is 27.12 kg/(m^2).  Wt Readings from Last 3 Encounters:  02/28/15 189 lb (85.73 kg)  10/17/14 180 lb (81.647 kg)  08/30/14 183 lb (83.008 kg)     No acute distress.  HEENT: Conjunctiva and lids normal, oropharynx clear with moist mucosa.  Neck: Supple, no  elevated JVP or carotid bruits, no thyromegaly.  Lungs: Clear to auscultation, nonlabored breathing at rest.  Cardiac: Regular rate and rhythm, no S3 or significant systolic murmur, no pericardial rub.  Extremities: No pitting edema, distal pulses 2+.  Skin: Warm and dry.    ECG: ECG is not ordered today.  Recent Labwork:  02/01/2015: Cholesterol 217, triglycerides 187, HDL 34, LDL 146 (previously 86), BUN 16, creatinine 0.9, potassium 4.4, AST 27, ALT 21, hemoglobin 14.7, platelet 244, TSH 10.3.  Other Studies Reviewed Today:  Echocardiogram 06/21/2012 at [facility] reported LVEF 50-55%, trace mitral regurgitation, mild tricuspid regurgitation.   ASSESSMENT AND PLAN:  1. Symptomatically stable CAD on medical therapy. Encouraged regular walking regimen, no changes made to his medications today.  2. Hyperlipidemia, having some trouble with Crestor side effects. He is trying very low-dose. We may switch to a different preparation if needed.  Current medicines were reviewed at length with the patient today.   Disposition: FU with me in 6 months.   Signed, Satira Sark, MD, [facility] 02/28/2015 1:35 PM    [facility] at [facility] [address] [place], [place] [address] Phone: [phone]; Fax: [phone]

## 2015-08-27 ENCOUNTER — Encounter: Payer: Self-pay | Admitting: Cardiology

## 2015-08-27 ENCOUNTER — Ambulatory Visit (INDEPENDENT_AMBULATORY_CARE_PROVIDER_SITE_OTHER): Payer: Medicare Other | Admitting: Cardiology

## 2015-08-27 VITALS — BP 106/72 | HR 70 | Ht 70.0 in | Wt 183.0 lb

## 2015-08-27 DIAGNOSIS — E785 Hyperlipidemia, unspecified: Secondary | ICD-10-CM

## 2015-08-27 DIAGNOSIS — I2510 Atherosclerotic heart disease of native coronary artery without angina pectoris: Secondary | ICD-10-CM

## 2015-08-27 DIAGNOSIS — Z136 Encounter for screening for cardiovascular disorders: Secondary | ICD-10-CM | POA: Diagnosis not present

## 2015-08-27 MED ORDER — NITROGLYCERIN 0.4 MG SL SUBL: 0.4000 mg | SUBLINGUAL_TABLET | SUBLINGUAL | Status: DC | PRN

## 2015-08-27 NOTE — Patient Instructions (Signed)
Your physician wants you to follow-up in: 6 months with Dr Ferne Reus will receive a reminder letter in the mail two months in advance. If you don't receive a letter, please call our office to schedule the follow-up appointment.     I refilled your nitroglycerine    Your physician recommends that you continue on your current medications as directed. Please refer to the Current Medication list given to you today.      If you need a refill on your cardiac medications before your next appointment, please call your pharmacy.        Thank you for choosing [facility] !

## 2015-08-27 NOTE — Progress Notes (Signed)
Cardiology Office Note  Date: 08/27/2015   ID: Brad Cruz, DOB May 01, 1946, MRN [id number]  PCP: Glenda Chroman., MD  Primary Cardiologist: Rozann Lesches, MD   Chief Complaint  Patient presents with  . Coronary Artery Disease    History of Present Illness: Brad Cruz is a 69 y.o. male last seen in April. He is here today with his wife for a routine visit. He reports occasional, atypical chest pain symptoms, not clearly indicative of angina. He has not been exercising regularly of late, attributes this to decreased energy with ongoing treatment for hypothyroidism. We did talk about trying to get back to a basic walking regimen.  Medications were reviewed. He has as needed nitroglycerin, stopped low-dose Crestor complaining of side effects.  Follow-up ECG today is normal.  He continues to follow regularly with Dr. Woody Seller.   Past Medical History  Diagnosis Date  . Coronary atherosclerosis of native coronary artery     a. 06/2012 Myoview:  No ischemia;  b. 06/25/2012 Cath:  LM nl, LAD 50p, 47m, D1 80 ost (small), D2 nl (small), LCX 30 ost, OM1 nl, RI 30 ost, RCA 50-60- ost, 64m, PDA minor irregs, PL nl (small), EF 65%.   . Hyperlipidemia   . GERD (gastroesophageal reflux disease)   . Vasovagal syncope   . Pulmonary granuloma ([place])     Chest CT 6/13  . Asthma     Childhood history  . Migraines   . BPH (benign prostatic hyperplasia)   . Skin cancer, basal cell   . Allergy     Mold, cat dander  . Cataract     Current Outpatient Prescriptions  Medication Sig Dispense Refill  . acetaminophen (TYLENOL) 500 MG tablet Take 1,000 mg by mouth every 6 (six) hours as needed.    . benzonatate (TESSALON) 100 MG capsule     . Coenzyme Q10 (COQ-10 PO) Take 1 capsule by mouth as needed.     Marland Kitchen esomeprazole (NEXIUM) 20 MG capsule Take 20 mg by mouth daily at 12 noon.    Marland Kitchen levothyroxine (SYNTHROID, LEVOTHROID) 88 MCG tablet     . nitroGLYCERIN (NITROSTAT) 0.4 MG SL tablet Place 1  tablet (0.4 mg total) under the tongue every 5 (five) minutes as needed for chest pain. 25 tablet 3  . psyllium (METAMUCIL) 58.6 % powder Take 1 packet by mouth 3 (three) times daily.    . Saw Palmetto, Serenoa repens, 320 MG CAPS Take 1 capsule by mouth every evening.     No current facility-administered medications for this visit.    Allergies:  Cats claw; Citrus; Dairy aid; Nsaids; Shellfish-derived products; and Ibuprofen   Social History: The patient  reports that he has never smoked. He has never used smokeless tobacco. He reports that he does not drink alcohol or use illicit drugs.   ROS:  Please see the history of present illness. Otherwise, complete review of systems is positive for none.  All other systems are reviewed and negative.   Physical Exam: VS:  BP 106/72 mmHg  Pulse 70  Ht 5\' 10"  (1.778 m)  Wt 183 lb (83.008 kg)  BMI 26.26 kg/m2  SpO2 97%, BMI Body mass index is 26.26 kg/(m^2).  Wt Readings from Last 3 Encounters:  08/27/15 183 lb (83.008 kg)  02/28/15 189 lb (85.73 kg)  10/17/14 180 lb (81.647 kg)     No acute distress.  HEENT: Conjunctiva and lids normal, oropharynx clear with moist mucosa.  Neck: Supple, no elevated  JVP or carotid bruits, no thyromegaly.  Lungs: Clear to auscultation, nonlabored breathing at rest.  Cardiac: Regular rate and rhythm, no S3 or significant systolic murmur, no pericardial rub.  Extremities: No pitting edema, distal pulses 2+.  Skin: Warm and dry.    ECG: ECG is ordered today.  Recent Labwork:  March 2016: TSH 10.3, cholesterol 217, triglycerides 187, HDL 34, LDL 146, BUN 16, creatinine 0.9, potassium 4.4, AST 27, ALT 21, hemoglobin 14.7, platelets 244.  Other Studies Reviewed Today:  Echocardiogram 06/21/2012 at [facility] reported LVEF 50-55%, trace mitral regurgitation, mild tricuspid regurgitation.  ASSESSMENT AND PLAN:  1. Mild to moderate CAD by cardiac catheterization in 2013 as outlined above.  We continue observation. He did not tolerate low-dose statin therapy. Has when necessary nitroglycerin available, refill provided for new bottle.  2. Hyperlipidemia with statin intolerance. Encouraged him to get back to a regular exercise plan.   Current medicines were reviewed at length with the patient today.   Orders Placed This Encounter  Procedures  . EKG 12-Lead    Disposition: FU with me in 6 months.   Signed, Satira Sark, MD, [facility] 08/27/2015 11:47 AM    [facility] [place]. [address] [place], [place] [address] Phone: [phone]; Fax: [phone]

## 2015-12-03 DIAGNOSIS — J329 Chronic sinusitis, unspecified: Secondary | ICD-10-CM | POA: Diagnosis not present

## 2016-02-07 DIAGNOSIS — Z299 Encounter for prophylactic measures, unspecified: Secondary | ICD-10-CM | POA: Diagnosis not present

## 2016-02-07 DIAGNOSIS — Z0000 Encounter for general adult medical examination without abnormal findings: Secondary | ICD-10-CM | POA: Diagnosis not present

## 2016-02-07 DIAGNOSIS — Z6827 Body mass index (BMI) 27.0-27.9, adult: Secondary | ICD-10-CM | POA: Diagnosis not present

## 2016-02-07 DIAGNOSIS — E7800 Pure hypercholesterolemia, unspecified: Secondary | ICD-10-CM | POA: Diagnosis not present

## 2016-02-07 DIAGNOSIS — R5383 Other fatigue: Secondary | ICD-10-CM | POA: Diagnosis not present

## 2016-02-07 DIAGNOSIS — Z7189 Other specified counseling: Secondary | ICD-10-CM | POA: Diagnosis not present

## 2016-02-07 DIAGNOSIS — Z1211 Encounter for screening for malignant neoplasm of colon: Secondary | ICD-10-CM | POA: Diagnosis not present

## 2016-02-07 DIAGNOSIS — Z79899 Other long term (current) drug therapy: Secondary | ICD-10-CM | POA: Diagnosis not present

## 2016-02-07 DIAGNOSIS — Z125 Encounter for screening for malignant neoplasm of prostate: Secondary | ICD-10-CM | POA: Diagnosis not present

## 2016-02-07 DIAGNOSIS — Z1389 Encounter for screening for other disorder: Secondary | ICD-10-CM | POA: Diagnosis not present

## 2016-02-29 ENCOUNTER — Ambulatory Visit (INDEPENDENT_AMBULATORY_CARE_PROVIDER_SITE_OTHER): Payer: Medicare Other | Admitting: Cardiology

## 2016-02-29 ENCOUNTER — Encounter: Payer: Self-pay | Admitting: Cardiology

## 2016-02-29 VITALS — BP 112/64 | HR 78 | Ht 71.0 in | Wt 190.0 lb

## 2016-02-29 DIAGNOSIS — E785 Hyperlipidemia, unspecified: Secondary | ICD-10-CM

## 2016-02-29 DIAGNOSIS — I2510 Atherosclerotic heart disease of native coronary artery without angina pectoris: Secondary | ICD-10-CM | POA: Diagnosis not present

## 2016-02-29 DIAGNOSIS — Z789 Other specified health status: Secondary | ICD-10-CM

## 2016-02-29 DIAGNOSIS — Z889 Allergy status to unspecified drugs, medicaments and biological substances status: Secondary | ICD-10-CM

## 2016-02-29 NOTE — Patient Instructions (Signed)
Your physician wants you to follow-up in: 6 months You will receive a reminder letter in the mail two months in advance. If you don't receive a letter, please call our office to schedule the follow-up appointment.    Your physician recommends that you continue on your current medications as directed. Please refer to the Current Medication list given to you today.    If you need a refill on your cardiac medications before your next appointment, please call your pharmacy.     Thank you for choosing [facility] !        

## 2016-02-29 NOTE — Progress Notes (Signed)
Cardiology Office Note  Date: 02/29/2016   ID: Brad Cruz, DOB [birth year], MRN [id number]  PCP: Glenda Chroman, MD  Primary Cardiologist: Rozann Lesches, MD   Chief Complaint  Patient presents with  . Coronary Artery Disease    History of Present Illness: Brad Cruz is a 70 y.o. male last seen in October 2016. He presents for a routine follow-up visit. No complaints of angina. Remains chronically fatigued, but still tries to do walking for exercise. He reports NYHA class II dyspnea.  I reviewed his most recent lab work per Dr. Woody Seller as outlined below. He is had prior statin intolerance, also intolerance of niacin. Triglycerides were mildly elevated, we discussed his diet. If this trends upward, he could consider omega-3 supplements.  Past Medical History  Diagnosis Date  . Coronary atherosclerosis of native coronary artery     a. 06/2012 Myoview:  No ischemia;  b. 06/25/2012 Cath:  LM nl, LAD 50p, 68m, D1 80 ost (small), D2 nl (small), LCX 30 ost, OM1 nl, RI 30 ost, RCA 50-60- ost, 6m, PDA minor irregs, PL nl (small), EF 65%.   . Hyperlipidemia   . GERD (gastroesophageal reflux disease)   . Vasovagal syncope   . 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 Asthma     Childhood history  . Migraines   . BPH (benign prostatic hyperplasia)   . Skin cancer, basal cell   . Allergy     Mold, cat dander  . Cataract     Current Outpatient Prescriptions  Medication Sig Dispense Refill  . acetaminophen (TYLENOL) 500 MG tablet Take 1,000 mg by mouth every 6 (six) hours as needed.    . benzonatate (TESSALON) 100 MG capsule     . Coenzyme Q10 (COQ-10 PO) Take 1 capsule by mouth as needed.     Marland Kitchen esomeprazole (NEXIUM) 20 MG capsule Take 20 mg by mouth daily at 12 noon.    Marland Kitchen levothyroxine (SYNTHROID, LEVOTHROID) 88 MCG tablet     . nitroGLYCERIN (NITROSTAT) 0.4 MG SL tablet Place 1 tablet (0.4 mg total) under the tongue every 5 (five) minutes as needed for chest pain. 25 tablet  3  . psyllium (METAMUCIL) 58.6 % powder Take 1 packet by mouth 3 (three) times daily.    . Saw Palmetto, Serenoa repens, 320 MG CAPS Take 1 capsule by mouth every evening.     No current facility-administered medications for this visit.   Allergies:  Cats claw; Citrus; Dairy aid; Nsaids; Shellfish-derived products; and Ibuprofen   Social History: The patient  reports that he has never smoked. He has never used smokeless tobacco. He reports that he does not drink alcohol or use illicit drugs.   ROS:  Please see the history of present illness. Otherwise, complete review of systems is positive for none.  All other systems are reviewed and negative.   Physical Exam: VS:  BP 112/64 mmHg  Pulse 78  Ht 5\' 11"  (1.803 m)  Wt 190 lb (86.183 kg)  BMI 26.51 kg/m2  SpO2 94%, BMI Body mass index is 26.51 kg/(m^2).  Wt Readings from Last 3 Encounters:  02/29/16 190 lb (86.183 kg)  08/27/15 183 lb (83.008 kg)  02/28/15 189 lb (85.73 kg)    No acute distress.  HEENT: Conjunctiva and lids normal, oropharynx clear with moist mucosa.  Neck: Supple, no elevated JVP or carotid bruits, no thyromegaly.  Lungs: Clear to auscultation, nonlabored breathing at rest.  Cardiac:  Regular rate and rhythm, no S3 or significant systolic murmur, no pericardial rub.  Extremities: No pitting edema, distal pulses 2+.  Skin: Warm and dry.   ECG: I personally reviewed the prior tracing from 08/27/2015 which showed normal sinus rhythm.  Recent Labwork:  April 2017: TSH 2.1, BUN 12, creatinine 0.9, potassium 4.3, AST 22, ALT 20, cholesterol 213, triglycerides 230, HDL 32, LDL 135, hemoglobin 15.2, platelets 229  Other Studies Reviewed Today:  Echocardiogram 06/21/2012 [facility]): LVEF 50-55%, trace mitral regurgitation, mild tricuspid regurgitation.  Cardiac catheterization 06/25/2012: Left mainstem: Normal  Left anterior descending (LAD): Proximal mild/moderate calcification. Proximal  long 50%. Mid 25%. D1 tiny sized with ostial 80%. D2 small and normal.   Left circumflex (LCx): AV groove with ostial 30%. MOM large and normal. Ramus intermedius is large with ostial 30%.  Right coronary artery (RCA): Very large with ostial 50 to 60%. Long mid 30%. PDA moderate sized with luminal irregularities. PL small and normal.  Left ventriculography: Left ventricular systolic function is normal, LVEF is estimated at 65, there is no significant mitral regurgitation   Final Conclusions: Nonobstructive CAD. NL LV function.  Assessment and Plan:  1. CAD status post cardiac catheterization in 2013 as outlined above, overall nonobstructive. No active angina symptoms at this time. Continue medical therapy and observation.  2. Mild hyperlipidemia with statin intolerance as well as intolerance to niacin. Continues to address via diet, also on coenzyme Q10.  3. History of vasovagal syncope, quiescent.  Current medicines were reviewed with the patient today.  Disposition: FU with me in 6 months.   Signed, Satira Sark, MD, [facility] 02/29/2016 10:15 AM    [place] [facility] [place]. [address], [place], [place] [address] Phone: [phone]; Fax: [phone]

## 2016-05-27 DIAGNOSIS — Z299 Encounter for prophylactic measures, unspecified: Secondary | ICD-10-CM | POA: Diagnosis not present

## 2016-05-27 DIAGNOSIS — L739 Follicular disorder, unspecified: Secondary | ICD-10-CM | POA: Diagnosis not present

## 2016-05-27 DIAGNOSIS — K219 Gastro-esophageal reflux disease without esophagitis: Secondary | ICD-10-CM | POA: Diagnosis not present

## 2016-05-27 DIAGNOSIS — Z789 Other specified health status: Secondary | ICD-10-CM | POA: Diagnosis not present

## 2016-06-19 DIAGNOSIS — J029 Acute pharyngitis, unspecified: Secondary | ICD-10-CM | POA: Diagnosis not present

## 2016-06-19 DIAGNOSIS — Z299 Encounter for prophylactic measures, unspecified: Secondary | ICD-10-CM | POA: Diagnosis not present

## 2016-07-28 DIAGNOSIS — C4441 Basal cell carcinoma of skin of scalp and neck: Secondary | ICD-10-CM | POA: Diagnosis not present

## 2016-07-28 DIAGNOSIS — D485 Neoplasm of uncertain behavior of skin: Secondary | ICD-10-CM | POA: Diagnosis not present

## 2016-07-28 DIAGNOSIS — L308 Other specified dermatitis: Secondary | ICD-10-CM | POA: Diagnosis not present

## 2016-07-28 DIAGNOSIS — D1801 Hemangioma of skin and subcutaneous tissue: Secondary | ICD-10-CM | POA: Diagnosis not present

## 2016-08-22 DIAGNOSIS — E039 Hypothyroidism, unspecified: Secondary | ICD-10-CM | POA: Diagnosis not present

## 2016-08-26 NOTE — Progress Notes (Signed)
Cardiology Office Note  Date: 08/27/2016   ID: Brad Cruz, DOB 08-Dec-1945, MRN [id number]  PCP: Glenda Chroman, MD  Primary Cardiologist: Rozann Lesches, MD   Chief Complaint  Patient presents with  . Coronary Artery Disease    History of Present Illness: Brad Cruz is a 70 y.o. male last seen in April. He is here with his wife today for a follow-up visit. He does not report any significant angina symptoms or worsening shortness of breath with usual activities. He had gotten into a walking regimen for exercise, but states that he got bored with this and has not been doing anything regularly recently.  We went over his medications which are outlined below. He has not needed any nitroglycerin.  I reviewed his ECG today which shows normal sinus rhythm. Also went over his lab work from April.  Past Medical History:  Diagnosis Date  . Allergy    Mold, cat dander  . Asthma    Childhood history  . BPH (benign prostatic hyperplasia)   . Cataract   . Coronary atherosclerosis of native coronary artery    a. 06/2012 Myoview:  No ischemia;  b. 06/25/2012 Cath:  LM nl, LAD 50p, 18m, D1 80 ost (small), D2 nl (small), LCX 30 ost, OM1 nl, RI 30 ost, RCA 50-60- ost, 16m, PDA minor irregs, PL nl (small), EF 65%.   Marland Kitchen GERD (gastroesophageal reflux disease)   . Hyperlipidemia   . Migraines   . 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 Skin cancer, basal cell   . Vasovagal syncope     Current Outpatient Prescriptions  Medication Sig Dispense Refill  . acetaminophen (TYLENOL) 500 MG tablet Take 1,000 mg by mouth every 6 (six) hours as needed.    . benzonatate (TESSALON) 100 MG capsule     . Coenzyme Q10 (COQ-10 PO) Take 1 capsule by mouth as needed.     Marland Kitchen esomeprazole (NEXIUM) 20 MG capsule Take 20 mg by mouth daily at 12 noon.    Marland Kitchen levothyroxine (SYNTHROID, LEVOTHROID) 88 MCG tablet     . nitroGLYCERIN (NITROSTAT) 0.4 MG SL tablet Place 1 tablet (0.4 mg total) under the tongue  every 5 (five) minutes as needed for chest pain. 25 tablet 3  . psyllium (METAMUCIL) 58.6 % powder Take 1 packet by mouth 3 (three) times daily.    . Saw Palmetto, Serenoa repens, 320 MG CAPS Take 1 capsule by mouth every evening.     No current facility-administered medications for this visit.    Allergies:  Cats claw [uncaria tomentosa (cats claw)]; Citrus; Dairy aid [lactase]; Nsaids; Shellfish-derived products; and Ibuprofen   Social History: The patient  reports that he has never smoked. He has never used smokeless tobacco. He reports that he does not drink alcohol or use drugs.   ROS:  Please see the history of present illness. Otherwise, complete review of systems is positive for none.  All other systems are reviewed and negative.   Physical Exam: VS:  BP (!) 112/58   Pulse 74   Ht 5\' 10"  (1.778 m)   Wt 182 lb (82.6 kg)   SpO2 96%   BMI 26.11 kg/m , BMI Body mass index is 26.11 kg/m.  Wt Readings from Last 3 Encounters:  08/27/16 182 lb (82.6 kg)  02/29/16 190 lb (86.2 kg)  08/27/15 183 lb (83 kg)    No acute distress.  HEENT: Conjunctiva and lids normal, oropharynx clear with  moist mucosa.  Neck: Supple, no elevated JVP or carotid bruits, no thyromegaly.  Lungs: Clear to auscultation, nonlabored breathing at rest.  Cardiac: Regular rate and rhythm, no S3 or significant systolic murmur, no pericardial rub.  Extremities: No pitting edema, distal pulses 2+.  Skin: Warm and dry.   ECG: I personally reviewed the tracing from 08/27/2015 which showed normal sinus rhythm.  Recent Labwork:  April 2017: TSH 2.1, BUN 12, creatinine 0.9, potassium 4.3, AST 22, ALT 20, cholesterol 213, triglycerides 230, HDL 32, LDL 135, hemoglobin 15.2, platelets 229  Other Studies Reviewed Today:  Cardiac catheterization 06/25/2012: Left mainstem: Normal  Left anterior descending (LAD): Proximal mild/moderate calcification. Proximal long 50%. Mid 25%. D1 tiny sized with ostial  80%. D2 small and normal.   Left circumflex (LCx): AV groove with ostial 30%. MOM large and normal. Ramus intermedius is large with ostial 30%.  Right coronary artery (RCA): Very large with ostial 50 to 60%. Long mid 30%. PDA moderate sized with luminal irregularities. PL small and normal.  Left ventriculography: Left ventricular systolic function is normal, LVEF is estimated at 65, there is no significant mitral regurgitation   Final Conclusions: Nonobstructive CAD. NL LV function.  Assessment and Plan:  1. History of nonobstructive CAD as outlined above, no reported angina symptoms. He has not required nitroglycerin. I continue to recommend exercise and observation. ECG is normal today.  2. Hyperlipidemia with history of statin intolerance.  Current medicines were reviewed with the patient today.   Orders Placed This Encounter  Procedures  . EKG 12-Lead    Disposition: Follow-up with me in 6 months.  Signed, Satira Sark, MD, [facility] 08/27/2016 11:14 AM    [facility] at [facility] [address], [place], [place] [address] Phone: [phone]; Fax: [phone]

## 2016-08-27 ENCOUNTER — Ambulatory Visit (INDEPENDENT_AMBULATORY_CARE_PROVIDER_SITE_OTHER): Payer: Medicare Other | Admitting: Cardiology

## 2016-08-27 ENCOUNTER — Encounter: Payer: Self-pay | Admitting: Cardiology

## 2016-08-27 VITALS — BP 112/58 | HR 74 | Ht 70.0 in | Wt 182.0 lb

## 2016-08-27 DIAGNOSIS — E782 Mixed hyperlipidemia: Secondary | ICD-10-CM | POA: Diagnosis not present

## 2016-08-27 DIAGNOSIS — Z23 Encounter for immunization: Secondary | ICD-10-CM | POA: Diagnosis not present

## 2016-08-27 DIAGNOSIS — Z789 Other specified health status: Secondary | ICD-10-CM

## 2016-08-27 DIAGNOSIS — I2510 Atherosclerotic heart disease of native coronary artery without angina pectoris: Secondary | ICD-10-CM | POA: Diagnosis not present

## 2016-08-27 NOTE — Patient Instructions (Signed)
Your physician recommends that you continue on your current medications as directed. Please refer to the Current Medication list given to you today.   Your physician wants you to follow-up in: [place].  You will receive a reminder letter in the mail two months in advance. If you don't receive a letter, please call our office to schedule the follow-up appointment.   Thanks for choosing [facility]!!!

## 2017-02-09 DIAGNOSIS — Z125 Encounter for screening for malignant neoplasm of prostate: Secondary | ICD-10-CM | POA: Diagnosis not present

## 2017-02-09 DIAGNOSIS — Z7189 Other specified counseling: Secondary | ICD-10-CM | POA: Diagnosis not present

## 2017-02-09 DIAGNOSIS — Z713 Dietary counseling and surveillance: Secondary | ICD-10-CM | POA: Diagnosis not present

## 2017-02-09 DIAGNOSIS — R5383 Other fatigue: Secondary | ICD-10-CM | POA: Diagnosis not present

## 2017-02-09 DIAGNOSIS — K219 Gastro-esophageal reflux disease without esophagitis: Secondary | ICD-10-CM | POA: Diagnosis not present

## 2017-02-09 DIAGNOSIS — Z1389 Encounter for screening for other disorder: Secondary | ICD-10-CM | POA: Diagnosis not present

## 2017-02-09 DIAGNOSIS — E7800 Pure hypercholesterolemia, unspecified: Secondary | ICD-10-CM | POA: Diagnosis not present

## 2017-02-09 DIAGNOSIS — E039 Hypothyroidism, unspecified: Secondary | ICD-10-CM | POA: Diagnosis not present

## 2017-02-09 DIAGNOSIS — Z79899 Other long term (current) drug therapy: Secondary | ICD-10-CM | POA: Diagnosis not present

## 2017-02-09 DIAGNOSIS — Z299 Encounter for prophylactic measures, unspecified: Secondary | ICD-10-CM | POA: Diagnosis not present

## 2017-02-09 DIAGNOSIS — Z0000 Encounter for general adult medical examination without abnormal findings: Secondary | ICD-10-CM | POA: Diagnosis not present

## 2017-02-09 DIAGNOSIS — Z1211 Encounter for screening for malignant neoplasm of colon: Secondary | ICD-10-CM | POA: Diagnosis not present

## 2017-03-09 ENCOUNTER — Ambulatory Visit (INDEPENDENT_AMBULATORY_CARE_PROVIDER_SITE_OTHER): Payer: Medicare Other | Admitting: Cardiology

## 2017-03-09 ENCOUNTER — Encounter: Payer: Self-pay | Admitting: Cardiology

## 2017-03-09 VITALS — BP 118/74 | HR 74 | Ht 69.0 in | Wt 190.0 lb

## 2017-03-09 DIAGNOSIS — Z789 Other specified health status: Secondary | ICD-10-CM

## 2017-03-09 DIAGNOSIS — E782 Mixed hyperlipidemia: Secondary | ICD-10-CM

## 2017-03-09 DIAGNOSIS — R5382 Chronic fatigue, unspecified: Secondary | ICD-10-CM | POA: Diagnosis not present

## 2017-03-09 DIAGNOSIS — I2510 Atherosclerotic heart disease of native coronary artery without angina pectoris: Secondary | ICD-10-CM | POA: Diagnosis not present

## 2017-03-09 NOTE — Progress Notes (Signed)
Cardiology Office Note  Date: 03/09/2017   ID: GAL FELDHAUS, DOB 02-Feb-1946, MRN [id number]  PCP: Glenda Chroman, MD  Primary Cardiologist: Rozann Lesches, MD   Chief Complaint  Patient presents with  . Coronary Artery Disease    History of Present Illness: Brad Cruz is a 71 y.o. male last seen in October 2017. He presents with his wife for a routine follow-up visit. Reports no regular exertional angina symptoms. Main complaint is of chronic fatigue which is been a fairly long-term issue. Does not appear to be related to his thyroid since this has been regulated with medical therapy. He continues to follow with Dr. Woody Seller.  He did have a recent physical, labwork outlined below.  Cardiac catheterization from 2013 is reviewed below. We have discussed eventually considering a follow-up stress test for ischemic surveillance. He did not have any obstructive disease based on prior workup. I reviewed his most recent ECG.  Past Medical History:  Diagnosis Date  . Allergy    Mold, cat dander  . Asthma    Childhood history  . BPH (benign prostatic hyperplasia)   . Cataract   . Coronary atherosclerosis of native coronary artery    a. 06/2012 Myoview:  No ischemia;  b. 06/25/2012 Cath:  LM nl, LAD 50p, 17m, D1 80 ost (small), D2 nl (small), LCX 30 ost, OM1 nl, RI 30 ost, RCA 50-60- ost, 39m, PDA minor irregs, PL nl (small), EF 65%.   Marland Kitchen GERD (gastroesophageal reflux disease)   . Hyperlipidemia   . Migraines   . 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 Skin cancer, basal cell   . Vasovagal syncope     Past Surgical History:  Procedure Laterality Date  . CARDIAC CATHETERIZATION    . COLONOSCOPY    . Deviated septum repair 1989    . LEFT HEART CATHETERIZATION WITH CORONARY ANGIOGRAM N/A 06/25/2012   Procedure: LEFT HEART CATHETERIZATION WITH CORONARY ANGIOGRAM;  Surgeon: Minus Breeding, MD;  Location: [facility] CATH LAB;  Service: Cardiovascular;  Laterality: N/A;  . LUMBAR  LAMINECTOMY/DECOMPRESSION MICRODISCECTOMY  11/23/2012   Procedure: LUMBAR LAMINECTOMY/DECOMPRESSION MICRODISCECTOMY 1 LEVEL;  Surgeon: Floyce Stakes, MD;  Location: [facility] NEURO ORS;  Service: Neurosurgery;  Laterality: Left;  Left Lumbar five-sacral one Diskectomy  . SKIN CANCER EXCISION     Under left eye basal cell    Current Outpatient Prescriptions  Medication Sig Dispense Refill  . acetaminophen (TYLENOL) 500 MG tablet Take 1,000 mg by mouth every 6 (six) hours as needed.    . benzonatate (TESSALON) 100 MG capsule     . levothyroxine (SYNTHROID, LEVOTHROID) 88 MCG tablet     . nitroGLYCERIN (NITROSTAT) 0.4 MG SL tablet Place 1 tablet (0.4 mg total) under the tongue every 5 (five) minutes as needed for chest pain. 25 tablet 3  . psyllium (METAMUCIL) 58.6 % powder Take 1 packet by mouth 3 (three) times daily.    . RABEprazole (ACIPHEX) 20 MG tablet     . Saw Palmetto, Serenoa repens, 320 MG CAPS Take 1 capsule by mouth every evening.     No current facility-administered medications for this visit.    Allergies:  Cats claw [uncaria tomentosa (cats claw)]; Citrus; Dairy aid [lactase]; Nsaids; Shellfish-derived products; and Ibuprofen   Social History: The patient  reports that he has never smoked. He has never used smokeless tobacco. He reports that he does not drink alcohol or use drugs.   ROS:  Please see  the history of present illness. Otherwise, complete review of systems is positive for chronic fatigue.  All other systems are reviewed and negative.   Physical Exam: VS:  BP 118/74   Pulse 74   Ht 5\' 9"  (1.753 m)   Wt 190 lb (86.2 kg)   SpO2 94%   BMI 28.06 kg/m , BMI Body mass index is 28.06 kg/m.  Wt Readings from Last 3 Encounters:  03/09/17 190 lb (86.2 kg)  08/27/16 182 lb (82.6 kg)  02/29/16 190 lb (86.2 kg)    No acute distress.  HEENT: Conjunctiva and lids normal, oropharynx clear with moist mucosa.  Neck: Supple, no elevated JVP or carotid bruits, no  thyromegaly.  Lungs: Clear to auscultation, nonlabored breathing at rest.  Cardiac: Regular rate and rhythm, no S3 or significant systolic murmur, no pericardial rub.  Extremities: No pitting edema, distal pulses 2+.  Skin: Warm and dry.   ECG: I personally reviewed the tracing from 08/27/2016 which showed normal sinus rhythm.  Recent Labwork:  April 2018: Cholesterol 219, triglycerides 205, HDL 34, LDL 144, hemoglobin 15.1, platelets 228, testosterone 396, BUN 23, creatinine 0.97, potassium 4.3, AST 20, ALT 18, PSA 0.8, TSH 2.34  Other Studies Reviewed Today:  Cardiac catheterization 06/25/2012: Left mainstem:   Normal  Left anterior descending (LAD):   Proximal mild/moderate calcification.  Proximal long 50%.  Mid 25%.  D1 tiny sized with ostial 80%.  D2 small and normal.    Left circumflex (LCx):  AV groove with ostial 30%.  MOM large and normal.  Ramus intermedius is large with ostial 30%.  Right coronary artery (RCA):  Very large with ostial 50 to 60%.  Long mid 30%.  PDA moderate sized with luminal irregularities.  PL small and normal.  Left ventriculography: Left ventricular systolic function is normal, LVEF is estimated at 65, there is no significant mitral regurgitation   Final Conclusions:  Nonobstructive CAD.  NL LV function.  Assessment and Plan:  1. History of nonobstructive CAD based on cardiac catheterization from 2013. Reports no regular angina symptoms. I reviewed his prior ECG. Recommend walking regimen for exercise. He states that he is significantly limited by chronic fatigue. He has a history of statin intolerance.  2. Chronic fatigue, workup per PCP including recent lab work reviewed. Does not appear to be related to thyroid status. Testosterone levels low normal range. Wonder whether he needs evaluation for possible depression or even obstructive sleep apnea.  3. History of hyperlipidemia with statin intolerance. Recent LDL 144.  4. History of  vasovagal syncope, no recent recurrences.  Current medicines were reviewed with the patient today.  Disposition: Follow-up in 6 months.  Signed, Satira Sark, MD, [facility] 03/09/2017 11:57 AM    [facility] at [place]. [address] [place], [place] [address] Phone: [phone]; Fax: [phone]

## 2017-03-09 NOTE — Patient Instructions (Signed)

## 2017-03-11 ENCOUNTER — Encounter: Payer: Self-pay | Admitting: Cardiology

## 2017-03-25 DIAGNOSIS — Z713 Dietary counseling and surveillance: Secondary | ICD-10-CM | POA: Diagnosis not present

## 2017-03-25 DIAGNOSIS — Z299 Encounter for prophylactic measures, unspecified: Secondary | ICD-10-CM | POA: Diagnosis not present

## 2017-03-25 DIAGNOSIS — J329 Chronic sinusitis, unspecified: Secondary | ICD-10-CM | POA: Diagnosis not present

## 2017-03-25 DIAGNOSIS — Z789 Other specified health status: Secondary | ICD-10-CM | POA: Diagnosis not present

## 2017-03-25 DIAGNOSIS — Z6827 Body mass index (BMI) 27.0-27.9, adult: Secondary | ICD-10-CM | POA: Diagnosis not present

## 2017-03-31 ENCOUNTER — Ambulatory Visit: Payer: Medicare Other | Admitting: Cardiology

## 2017-03-31 DIAGNOSIS — D485 Neoplasm of uncertain behavior of skin: Secondary | ICD-10-CM | POA: Diagnosis not present

## 2017-03-31 DIAGNOSIS — L820 Inflamed seborrheic keratosis: Secondary | ICD-10-CM | POA: Diagnosis not present

## 2017-05-19 DIAGNOSIS — E039 Hypothyroidism, unspecified: Secondary | ICD-10-CM | POA: Diagnosis not present

## 2017-05-19 DIAGNOSIS — I10 Essential (primary) hypertension: Secondary | ICD-10-CM | POA: Diagnosis not present

## 2017-05-19 DIAGNOSIS — Z299 Encounter for prophylactic measures, unspecified: Secondary | ICD-10-CM | POA: Diagnosis not present

## 2017-05-19 DIAGNOSIS — E785 Hyperlipidemia, unspecified: Secondary | ICD-10-CM | POA: Diagnosis not present

## 2017-05-19 DIAGNOSIS — Z713 Dietary counseling and surveillance: Secondary | ICD-10-CM | POA: Diagnosis not present

## 2017-05-19 DIAGNOSIS — R21 Rash and other nonspecific skin eruption: Secondary | ICD-10-CM | POA: Diagnosis not present

## 2017-05-19 DIAGNOSIS — N400 Enlarged prostate without lower urinary tract symptoms: Secondary | ICD-10-CM | POA: Diagnosis not present

## 2017-07-30 DIAGNOSIS — D225 Melanocytic nevi of trunk: Secondary | ICD-10-CM | POA: Diagnosis not present

## 2017-07-30 DIAGNOSIS — D2272 Melanocytic nevi of left lower limb, including hip: Secondary | ICD-10-CM | POA: Diagnosis not present

## 2017-07-30 DIAGNOSIS — L720 Epidermal cyst: Secondary | ICD-10-CM | POA: Diagnosis not present

## 2017-07-30 DIAGNOSIS — Z85828 Personal history of other malignant neoplasm of skin: Secondary | ICD-10-CM | POA: Diagnosis not present

## 2017-07-30 DIAGNOSIS — L304 Erythema intertrigo: Secondary | ICD-10-CM | POA: Diagnosis not present

## 2017-07-30 DIAGNOSIS — D1801 Hemangioma of skin and subcutaneous tissue: Secondary | ICD-10-CM | POA: Diagnosis not present

## 2017-07-30 DIAGNOSIS — D2271 Melanocytic nevi of right lower limb, including hip: Secondary | ICD-10-CM | POA: Diagnosis not present

## 2017-08-12 DIAGNOSIS — Z23 Encounter for immunization: Secondary | ICD-10-CM | POA: Diagnosis not present

## 2017-08-12 DIAGNOSIS — Z6827 Body mass index (BMI) 27.0-27.9, adult: Secondary | ICD-10-CM | POA: Diagnosis not present

## 2017-08-12 DIAGNOSIS — I10 Essential (primary) hypertension: Secondary | ICD-10-CM | POA: Diagnosis not present

## 2017-08-12 DIAGNOSIS — Z299 Encounter for prophylactic measures, unspecified: Secondary | ICD-10-CM | POA: Diagnosis not present

## 2017-08-12 DIAGNOSIS — K219 Gastro-esophageal reflux disease without esophagitis: Secondary | ICD-10-CM | POA: Diagnosis not present

## 2017-08-18 ENCOUNTER — Encounter (INDEPENDENT_AMBULATORY_CARE_PROVIDER_SITE_OTHER): Payer: Self-pay

## 2017-08-18 ENCOUNTER — Encounter (INDEPENDENT_AMBULATORY_CARE_PROVIDER_SITE_OTHER): Payer: Self-pay | Admitting: Internal Medicine

## 2017-09-08 ENCOUNTER — Encounter (INDEPENDENT_AMBULATORY_CARE_PROVIDER_SITE_OTHER): Payer: Self-pay | Admitting: *Deleted

## 2017-09-08 ENCOUNTER — Ambulatory Visit (INDEPENDENT_AMBULATORY_CARE_PROVIDER_SITE_OTHER): Payer: Medicare Other | Admitting: Internal Medicine

## 2017-09-08 ENCOUNTER — Encounter (INDEPENDENT_AMBULATORY_CARE_PROVIDER_SITE_OTHER): Payer: Self-pay | Admitting: Internal Medicine

## 2017-09-08 VITALS — BP 112/68 | HR 60 | Temp 97.5°F | Ht 70.0 in | Wt 191.3 lb

## 2017-09-08 DIAGNOSIS — K219 Gastro-esophageal reflux disease without esophagitis: Secondary | ICD-10-CM

## 2017-09-08 DIAGNOSIS — I2510 Atherosclerotic heart disease of native coronary artery without angina pectoris: Secondary | ICD-10-CM

## 2017-09-08 MED ORDER — PANTOPRAZOLE SODIUM 40 MG PO TBEC: 40.0000 mg | DELAYED_RELEASE_TABLET | Freq: Every day | ORAL | 3 refills | Status: DC

## 2017-09-08 NOTE — Progress Notes (Signed)
Subjective:    Patient ID: Brad Cruz, male    DOB: August 12, 1946, 71 y.o.   MRN: [id number]  HPI  Referred by DR. Vyas for GERD/EGD. Has tried Nexium and Aciphex in the past. Given samples of Dexilant at [facility] in October.  Takes Zantac 300mg  daily.  States he is 80% controlled with the Zantac and Aciphex. Diagnosed with GERD about 23 yrs ago in [place].  He did have an endoscopy in [place] and was told he had a hiatal hernia. In the past year, his GERD has flared up.     Review of Systems Past Medical History:  Diagnosis Date  . Allergy    Mold, cat dander  . Asthma    Childhood history  . BPH (benign prostatic hyperplasia)   . Cataract   . Coronary atherosclerosis of native coronary artery    a. 06/2012 Myoview:  No ischemia;  b. 06/25/2012 Cath:  LM nl, LAD 50p, 25m, D1 80 ost (small), D2 nl (small), LCX 30 ost, OM1 nl, RI 30 ost, RCA 50-60- ost, 32m, PDA minor irregs, PL nl (small), EF 65%.   Marland Kitchen GERD (gastroesophageal reflux disease)   . Hyperlipidemia   . Migraines   . 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 Skin cancer, basal cell   . Vasovagal syncope     Past Surgical History:  Procedure Laterality Date  . CARDIAC CATHETERIZATION    . COLONOSCOPY    . Deviated septum repair 1989    . SKIN CANCER EXCISION     Under left eye basal cell    Allergies  Allergen Reactions  . Cats Claw [Uncaria [place]osa (Cats Claw)] Other (See Comments)    asthma  . Citrus     hives  . Dairy Aid [Lactase]     Scalp itches/face burns  . Nsaids Itching    Possibly other NSAID'S, but ibuprofen for sure.   Marland Kitchen Shellfish-Derived Products Other (See Comments)    Rash   . Ibuprofen Hives, Swelling and Rash    Current Outpatient Medications on File Prior to Visit  Medication Sig Dispense Refill  . acetaminophen (TYLENOL) 500 MG tablet Take 1,000 mg by mouth every 6 (six) hours as needed.    . RABEprazole (ACIPHEX) 20 MG tablet     . ranitidine (ZANTAC) 300 MG tablet Take  300 mg at bedtime by mouth.    . Saw Palmetto, Serenoa repens, 320 MG CAPS Take 1 capsule by mouth every evening.    . nitroGLYCERIN (NITROSTAT) 0.4 MG SL tablet Place 1 tablet (0.4 mg total) under the tongue every 5 (five) minutes as needed for chest pain. (Patient not taking: Reported on 09/08/2017) 25 tablet 3   No current facility-administered medications on file prior to visit.         Objective:   Physical Exam Blood pressure 112/68, pulse 60, temperature (!) 97.5 F (36.4 C), height 5\' 10"  (1.778 m), weight 191 lb 4.8 oz (86.8 kg). Alert and oriented. Skin warm and dry. Oral mucosa is moist.   . Sclera anicteric, conjunctivae is pink. Thyroid not enlarged. No cervical lymphadenopathy. Lungs clear. Heart regular rate and rhythm.  Abdomen is soft. Bowel sounds are positive. No hepatomegaly. No abdominal masses felt. No tenderness.  No edema to lower extremities.           Assessment & Plan:  GERD. Needs an EGD. The risks of bleeding, perforation and infection were reviewed with patient. GERD diet  to patient.

## 2017-09-08 NOTE — Patient Instructions (Addendum)
Rx for Protonix sent to his pharmacy. The risks of bleeding, perforation and infection were reviewed with patient.       Gastroesophageal Reflux Disease, Adult Normally, food travels down the esophagus and stays in the stomach to be digested. If a person has gastroesophageal reflux disease (GERD), food and stomach acid move back up into the esophagus. When this happens, the esophagus becomes sore and swollen (inflamed). Over time, GERD can make small holes (ulcers) in the lining of the esophagus. Follow these instructions at home: Diet  Follow a diet as told by your doctor. You may need to avoid foods and drinks such as: ? Coffee and tea (with or without caffeine). ? Drinks that contain alcohol. ? Energy drinks and sports drinks. ? Carbonated drinks or sodas. ? Chocolate and cocoa. ? Peppermint and mint flavorings. ? Garlic and onions. ? Horseradish. ? Spicy and acidic foods, such as peppers, chili powder, curry powder, vinegar, hot sauces, and BBQ sauce. ? Citrus fruit juices and citrus fruits, such as oranges, lemons, and limes. ? Tomato-based foods, such as red sauce, chili, salsa, and pizza with red sauce. ? Fried and fatty foods, such as donuts, french fries, potato chips, and high-fat dressings. ? High-fat meats, such as hot dogs, rib eye steak, sausage, ham, and bacon. ? High-fat dairy items, such as whole milk, butter, and cream cheese.  Eat small meals often. Avoid eating large meals.  Avoid drinking large amounts of liquid with your meals.  Avoid eating meals during the 2-3 hours before bedtime.  Avoid lying down right after you eat.  Do not exercise right after you eat. General instructions  Pay attention to any changes in your symptoms.  Take over-the-counter and prescription medicines only as told by your doctor. Do not take aspirin, ibuprofen, or other NSAIDs unless your doctor says it is okay.  Do not use any tobacco products, including cigarettes, chewing  tobacco, and e-cigarettes. If you need help quitting, ask your doctor.  Wear loose clothes. Do not wear anything tight around your waist.  Raise (elevate) the head of your bed about 6 inches (15 cm).  Try to lower your stress. If you need help doing this, ask your doctor.  If you are overweight, lose an amount of weight that is healthy for you. Ask your doctor about a safe weight loss goal.  Keep all follow-up visits as told by your doctor. This is important. Contact a doctor if:  You have new symptoms.  You lose weight and you do not know why it is happening.  You have trouble swallowing, or it hurts to swallow.  You have wheezing or a cough that keeps happening.  Your symptoms do not get better with treatment.  You have a hoarse voice. Get help right away if:  You have pain in your arms, neck, jaw, teeth, or back.  You feel sweaty, dizzy, or light-headed.  You have chest pain or shortness of breath.  You throw up (vomit) and your throw up looks like blood or coffee grounds.  You pass out (faint).  Your poop (stool) is bloody or black.  You cannot swallow, drink, or eat. This information is not intended to replace advice given to you by your health care provider. Make sure you discuss any questions you have with your health care provider. Document Released: 04/07/2008 Document Revised: 03/27/2016 Document Reviewed: 02/14/2015 Elsevier Interactive Patient Education  2018 [place].  Gastroesophageal Reflux Disease, Adult Normally, food travels down the esophagus and stays  in the stomach to be digested. If a person has gastroesophageal reflux disease (GERD), food and stomach acid move back up into the esophagus. When this happens, the esophagus becomes sore and swollen (inflamed). Over time, GERD can make small holes (ulcers) in the lining of the esophagus. Follow these instructions at home: Diet  Follow a diet as told by your doctor. You may need to avoid foods and  drinks such as: ? Coffee and tea (with or without caffeine). ? Drinks that contain alcohol. ? Energy drinks and sports drinks. ? Carbonated drinks or sodas. ? Chocolate and cocoa. ? Peppermint and mint flavorings. ? Garlic and onions. ? Horseradish. ? Spicy and acidic foods, such as peppers, chili powder, curry powder, vinegar, hot sauces, and BBQ sauce. ? Citrus fruit juices and citrus fruits, such as oranges, lemons, and limes. ? Tomato-based foods, such as red sauce, chili, salsa, and pizza with red sauce. ? Fried and fatty foods, such as donuts, french fries, potato chips, and high-fat dressings. ? High-fat meats, such as hot dogs, rib eye steak, sausage, ham, and bacon. ? High-fat dairy items, such as whole milk, butter, and cream cheese.  Eat small meals often. Avoid eating large meals.  Avoid drinking large amounts of liquid with your meals.  Avoid eating meals during the 2-3 hours before bedtime.  Avoid lying down right after you eat.  Do not exercise right after you eat. General instructions  Pay attention to any changes in your symptoms.  Take over-the-counter and prescription medicines only as told by your doctor. Do not take aspirin, ibuprofen, or other NSAIDs unless your doctor says it is okay.  Do not use any tobacco products, including cigarettes, chewing tobacco, and e-cigarettes. If you need help quitting, ask your doctor.  Wear loose clothes. Do not wear anything tight around your waist.  Raise (elevate) the head of your bed about 6 inches (15 cm).  Try to lower your stress. If you need help doing this, ask your doctor.  If you are overweight, lose an amount of weight that is healthy for you. Ask your doctor about a safe weight loss goal.  Keep all follow-up visits as told by your doctor. This is important. Contact a doctor if:  You have new symptoms.  You lose weight and you do not know why it is happening.  You have trouble swallowing, or it hurts  to swallow.  You have wheezing or a cough that keeps happening.  Your symptoms do not get better with treatment.  You have a hoarse voice. Get help right away if:  You have pain in your arms, neck, jaw, teeth, or back.  You feel sweaty, dizzy, or light-headed.  You have chest pain or shortness of breath.  You throw up (vomit) and your throw up looks like blood or coffee grounds.  You pass out (faint).  Your poop (stool) is bloody or black.  You cannot swallow, drink, or eat. This information is not intended to replace advice given to you by your health care provider. Make sure you discuss any questions you have with your health care provider. Document Released: 04/07/2008 Document Revised: 03/27/2016 Document Reviewed: 02/14/2015 Elsevier Interactive Patient Education  2018 [place].  Gastroesophageal Reflux Disease, Adult Normally, food travels down the esophagus and stays in the stomach to be digested. If a person has gastroesophageal reflux disease (GERD), food and stomach acid move back up into the esophagus. When this happens, the esophagus becomes sore and swollen (inflamed). Over  time, GERD can make small holes (ulcers) in the lining of the esophagus. Follow these instructions at home: Diet  Follow a diet as told by your doctor. You may need to avoid foods and drinks such as: ? Coffee and tea (with or without caffeine). ? Drinks that contain alcohol. ? Energy drinks and sports drinks. ? Carbonated drinks or sodas. ? Chocolate and cocoa. ? Peppermint and mint flavorings. ? Garlic and onions. ? Horseradish. ? Spicy and acidic foods, such as peppers, chili powder, curry powder, vinegar, hot sauces, and BBQ sauce. ? Citrus fruit juices and citrus fruits, such as oranges, lemons, and limes. ? Tomato-based foods, such as red sauce, chili, salsa, and pizza with red sauce. ? Fried and fatty foods, such as donuts, french fries, potato chips, and high-fat  dressings. ? High-fat meats, such as hot dogs, rib eye steak, sausage, ham, and bacon. ? High-fat dairy items, such as whole milk, butter, and cream cheese.  Eat small meals often. Avoid eating large meals.  Avoid drinking large amounts of liquid with your meals.  Avoid eating meals during the 2-3 hours before bedtime.  Avoid lying down right after you eat.  Do not exercise right after you eat. General instructions  Pay attention to any changes in your symptoms.  Take over-the-counter and prescription medicines only as told by your doctor. Do not take aspirin, ibuprofen, or other NSAIDs unless your doctor says it is okay.  Do not use any tobacco products, including cigarettes, chewing tobacco, and e-cigarettes. If you need help quitting, ask your doctor.  Wear loose clothes. Do not wear anything tight around your waist.  Raise (elevate) the head of your bed about 6 inches (15 cm).  Try to lower your stress. If you need help doing this, ask your doctor.  If you are overweight, lose an amount of weight that is healthy for you. Ask your doctor about a safe weight loss goal.  Keep all follow-up visits as told by your doctor. This is important. Contact a doctor if:  You have new symptoms.  You lose weight and you do not know why it is happening.  You have trouble swallowing, or it hurts to swallow.  You have wheezing or a cough that keeps happening.  Your symptoms do not get better with treatment.  You have a hoarse voice. Get help right away if:  You have pain in your arms, neck, jaw, teeth, or back.  You feel sweaty, dizzy, or light-headed.  You have chest pain or shortness of breath.  You throw up (vomit) and your throw up looks like blood or coffee grounds.  You pass out (faint).  Your poop (stool) is bloody or black.  You cannot swallow, drink, or eat. This information is not intended to replace advice given to you by your health care provider. Make sure you  discuss any questions you have with your health care provider. Document Released: 04/07/2008 Document Revised: 03/27/2016 Document Reviewed: 02/14/2015 Elsevier Interactive Patient Education  [employer].

## 2017-09-09 ENCOUNTER — Ambulatory Visit (HOSPITAL_COMMUNITY)
Admission: RE | Admit: 2017-09-09 | Discharge: 2017-09-09 | Disposition: A | Discharge status: Still In Hospital | Payer: Medicare Other | Source: Ambulatory Visit | Attending: Internal Medicine | Admitting: Internal Medicine

## 2017-09-09 ENCOUNTER — Encounter (HOSPITAL_COMMUNITY)
Admission: RE | Disposition: A | Discharge status: Still In Hospital | Payer: Self-pay | Source: Ambulatory Visit | Attending: Internal Medicine

## 2017-09-09 ENCOUNTER — Encounter (HOSPITAL_COMMUNITY): Payer: Self-pay

## 2017-09-09 ENCOUNTER — Observation Stay (HOSPITAL_COMMUNITY)
Admission: EM | Admit: 2017-09-09 | Discharge: 2017-09-10 | Disposition: A | Discharge status: Home / Self Care | Payer: Medicare Other | Attending: Internal Medicine | Admitting: Internal Medicine

## 2017-09-09 ENCOUNTER — Other Ambulatory Visit: Payer: Self-pay

## 2017-09-09 ENCOUNTER — Emergency Department (HOSPITAL_COMMUNITY): Payer: Medicare Other

## 2017-09-09 ENCOUNTER — Encounter (HOSPITAL_COMMUNITY): Payer: Self-pay | Admitting: *Deleted

## 2017-09-09 DIAGNOSIS — R001 Bradycardia, unspecified: Secondary | ICD-10-CM | POA: Insufficient documentation

## 2017-09-09 DIAGNOSIS — I2510 Atherosclerotic heart disease of native coronary artery without angina pectoris: Secondary | ICD-10-CM | POA: Diagnosis not present

## 2017-09-09 DIAGNOSIS — E039 Hypothyroidism, unspecified: Secondary | ICD-10-CM | POA: Insufficient documentation

## 2017-09-09 DIAGNOSIS — Z85828 Personal history of other malignant neoplasm of skin: Secondary | ICD-10-CM | POA: Diagnosis not present

## 2017-09-09 DIAGNOSIS — K449 Diaphragmatic hernia without obstruction or gangrene: Secondary | ICD-10-CM | POA: Diagnosis not present

## 2017-09-09 DIAGNOSIS — K219 Gastro-esophageal reflux disease without esophagitis: Secondary | ICD-10-CM | POA: Diagnosis not present

## 2017-09-09 DIAGNOSIS — R55 Syncope and collapse: Secondary | ICD-10-CM | POA: Insufficient documentation

## 2017-09-09 DIAGNOSIS — K2970 Gastritis, unspecified, without bleeding: Secondary | ICD-10-CM | POA: Insufficient documentation

## 2017-09-09 DIAGNOSIS — Z79899 Other long term (current) drug therapy: Secondary | ICD-10-CM | POA: Diagnosis not present

## 2017-09-09 DIAGNOSIS — K319 Disease of stomach and duodenum, unspecified: Secondary | ICD-10-CM | POA: Diagnosis not present

## 2017-09-09 HISTORY — PX: ESOPHAGOGASTRODUODENOSCOPY: SHX5428

## 2017-09-09 HISTORY — DX: Hypothyroidism, unspecified: E03.9

## 2017-09-09 HISTORY — PX: BIOPSY: SHX5522

## 2017-09-09 LAB — CBC WITH DIFFERENTIAL/PLATELET
Basophils Absolute: 0 10*3/uL (ref 0.0–0.1)
Basophils Relative: 0 %
EOS ABS: 0.1 10*3/uL (ref 0.0–0.7)
Eosinophils Relative: 3 %
HEMATOCRIT: 38.4 % — AB (ref 39.0–52.0)
HEMOGLOBIN: 12.9 g/dL — AB (ref 13.0–17.0)
LYMPHS ABS: 1.8 10*3/uL (ref 0.7–4.0)
Lymphocytes Relative: 37 %
MCH: 30.5 pg (ref 26.0–34.0)
MCHC: 33.6 g/dL (ref 30.0–36.0)
MCV: 90.8 fL (ref 78.0–100.0)
MONOS PCT: 9 %
Monocytes Absolute: 0.4 10*3/uL (ref 0.1–1.0)
NEUTROS ABS: 2.4 10*3/uL (ref 1.7–7.7)
NEUTROS PCT: 51 %
Platelets: 152 10*3/uL (ref 150–400)
RBC: 4.23 MIL/uL (ref 4.22–5.81)
RDW: 12.5 % (ref 11.5–15.5)
WBC: 4.7 10*3/uL (ref 4.0–10.5)

## 2017-09-09 LAB — COMPREHENSIVE METABOLIC PANEL
ALBUMIN: 3.5 g/dL (ref 3.5–5.0)
ALK PHOS: 43 U/L (ref 38–126)
ALT: 20 U/L (ref 17–63)
AST: 19 U/L (ref 15–41)
Anion gap: 7 (ref 5–15)
BILIRUBIN TOTAL: 1 mg/dL (ref 0.3–1.2)
BUN: 20 mg/dL (ref 6–20)
CALCIUM: 7.8 mg/dL — AB (ref 8.9–10.3)
CO2: 24 mmol/L (ref 22–32)
CREATININE: 0.86 mg/dL (ref 0.61–1.24)
Chloride: 104 mmol/L (ref 101–111)
GFR calc non Af Amer: >60 mL/min (ref >60)
GLUCOSE: 113 mg/dL — AB (ref 65–99)
Potassium: 3.8 mmol/L (ref 3.5–5.1)
SODIUM: 135 mmol/L (ref 135–145)
TOTAL PROTEIN: 5.9 g/dL — AB (ref 6.5–8.1)

## 2017-09-09 LAB — GLUCOSE, CAPILLARY: Glucose-Capillary: 126 mg/dL — ABNORMAL HIGH (ref 65–99)

## 2017-09-09 SURGERY — EGD (ESOPHAGOGASTRODUODENOSCOPY)
Anesthesia: Moderate Sedation

## 2017-09-09 MED ORDER — LEVOTHYROXINE SODIUM 88 MCG PO TABS
88.0000 ug | ORAL_TABLET | Freq: Every day | ORAL | Status: DC
  Administered 2017-09-10: 88 ug via ORAL
  Filled 2017-09-09: qty 1

## 2017-09-09 MED ORDER — LIDOCAINE VISCOUS 2 % MT SOLN
OROMUCOSAL | Status: DC | PRN
  Administered 2017-09-09: 4 mL via OROMUCOSAL

## 2017-09-09 MED ORDER — SODIUM CHLORIDE 0.9 % IV SOLN
INTRAVENOUS | Status: DC
  Administered 2017-09-09: 1000 mL via INTRAVENOUS

## 2017-09-09 MED ORDER — SODIUM CHLORIDE 0.9 % IV SOLN
INTRAVENOUS | Status: DC
  Administered 2017-09-09 – 2017-09-10 (×2): via INTRAVENOUS

## 2017-09-09 MED ORDER — MIDAZOLAM HCL 5 MG/5ML IJ SOLN
INTRAMUSCULAR | Status: AC
  Filled 2017-09-09: qty 10

## 2017-09-09 MED ORDER — ACETAMINOPHEN 500 MG PO TABS: 1000.0000 mg | ORAL_TABLET | Freq: Every day | ORAL | Status: DC | PRN

## 2017-09-09 MED ORDER — FAMOTIDINE 20 MG PO TABS
20.0000 mg | ORAL_TABLET | Freq: Two times a day (BID) | ORAL | Status: DC
  Administered 2017-09-09 – 2017-09-10 (×2): 20 mg via ORAL
  Filled 2017-09-09 (×2): qty 1

## 2017-09-09 MED ORDER — MEPERIDINE HCL 50 MG/ML IJ SOLN
INTRAMUSCULAR | Status: AC
  Filled 2017-09-09: qty 1

## 2017-09-09 MED ORDER — STERILE WATER FOR IRRIGATION IR SOLN
Status: DC | PRN
  Administered 2017-09-09: 10:00:00

## 2017-09-09 MED ORDER — NITROGLYCERIN 0.4 MG SL SUBL: 0.4000 mg | SUBLINGUAL_TABLET | SUBLINGUAL | Status: DC | PRN

## 2017-09-09 MED ORDER — PSYLLIUM 95 % PO PACK
1.0000 | PACK | Freq: Every day | ORAL | Status: DC
  Administered 2017-09-10: 1 via ORAL
  Filled 2017-09-09 (×2): qty 1

## 2017-09-09 MED ORDER — ACETAMINOPHEN 325 MG PO TABS
650.0000 mg | ORAL_TABLET | Freq: Four times a day (QID) | ORAL | Status: DC | PRN
  Administered 2017-09-09: 650 mg via ORAL
  Filled 2017-09-09: qty 2

## 2017-09-09 MED ORDER — ACETAMINOPHEN 650 MG RE SUPP: 650.0000 mg | Freq: Four times a day (QID) | RECTAL | Status: DC | PRN

## 2017-09-09 MED ORDER — MIDAZOLAM HCL 5 MG/5ML IJ SOLN
INTRAMUSCULAR | Status: DC | PRN
  Administered 2017-09-09: 1 mg via INTRAVENOUS
  Administered 2017-09-09 (×2): 2 mg via INTRAVENOUS

## 2017-09-09 MED ORDER — ENOXAPARIN SODIUM 40 MG/0.4ML ~~LOC~~ SOLN
40.0000 mg | SUBCUTANEOUS | Status: DC
  Administered 2017-09-09: 40 mg via SUBCUTANEOUS
  Filled 2017-09-09: qty 0.4

## 2017-09-09 MED ORDER — MEPERIDINE HCL 50 MG/ML IJ SOLN
INTRAMUSCULAR | Status: DC | PRN
  Administered 2017-09-09 (×2): 25 mg via INTRAVENOUS

## 2017-09-09 MED ORDER — SODIUM CHLORIDE 0.9% FLUSH: 3.0000 mL | Freq: Two times a day (BID) | INTRAVENOUS | Status: DC

## 2017-09-09 MED ORDER — LIDOCAINE VISCOUS 2 % MT SOLN
OROMUCOSAL | Status: DC
Start: 2017-09-09 — End: 2017-09-09
  Filled 2017-09-09: qty 15

## 2017-09-09 MED FILL — Medication: Qty: 1 | Status: AC

## 2017-09-09 NOTE — ED Triage Notes (Addendum)
Pt had EGD by Dr Laural Golden  Pt was given numbing med for throat, 50 mg IV Demerol and 4 mg versed. Pt was 50 minutes post op and ready for discharge. pt had been stable throughout post op. Iv removed and nurse reviewing discharge instructions . Pt went completley unresponsive while in chair and O2  administered Narcan 0.4 mg  IV at 1127.Pt regained consciousness approx 1135am. Brought to ED. Pt is lethargic, but verbally responsive and answering questions. Sinus brady on monitor. CBG 126

## 2017-09-09 NOTE — Progress Notes (Signed)
Late Entry.  Patient on stretcher from 1048am  to 1114am. Patient sitting upright in chair tolerating sips of water. Discharge instructions reviewed with wife.  Proceeded to remove IV. Placed pressure over IV site and discussed with wife that patient  can put shirt on.  Addressed patient by name and patient was looking around. Then patient became diaphoretic and respirations became shallow. Oxygen via Non-rebreather mask applied and continued to call patient by name. Patient closed eyes and became unresponsive. Carotid and radial pulses palpable. Code blue alert activated. Patient moved from recliner to stretcher and respirations assisted via oral airway and ambubag per anesthesia. Dr. Laural Golden , Dr. Carolanne Grumbling, Dr. Helane Gunther at bedside. Accucheck 126. Narcan 0.4mg  IV per MD at 1127 with slow response.  Patient opened eyes , spontaneous respiratons at 1134am.Patient transported to ED via stretcher with monitor and Oxygen at 1140 for further evaluation. Report to Angelina Pih RN in ED.  Wife at bedside.

## 2017-09-09 NOTE — H&P (Signed)
TRH H&P   Patient Demographics:    Brad Cruz, is a 71 y.o. male  MRN: [id number]   DOB - 1946/07/18  Admit Date - 09/09/2017  Outpatient Primary MD for the patient is Glenda Chroman, MD  Referring MD: Dr Venita Sheffield  Outpatient Specialists:  Dr. Domenic Polite (cardiology Dr. Corbin Ade (GI)  Patient coming from:  home  Chief Complaint  Patient presents with  . Loss of Consciousness      HPI:    Brad Cruz  is a 71 y.o. male, With a history of Long-standing GERD and hiatal hernia who Underwent elective endoscopy today. Postprocedure he was stable and laced in the recovery area getting ready to be discharged home. He was hemodynamically stable and his IV line was discontinued. He was given some water to drink and after taking a few sips started gurgling and became cyanotic and slowly unresponsive.CODE BLUE was called and patient quickly moved back to the stretcher. Gastroenterologist examined the patient at the same time and patient had pulse. He was found to be bradycardic and patient placed on Ambu bag to support breathing. He was given 0.4 mg of IV Narcan and slowly became awake and responded appropriately. Heart monitor rhythm slowly showed a sinus rhythm and EKG done without any acute findings. Patient sent to ED for further evaluation. patient had received Lidocaine for oropharyngeal anesthesia along with Demerol 50 minute him IV in divided doses and Versed 5 mg IV in divided doses. Patient has no recollection of symptoms.Subsequent vitals in the ED were stable. Blood work showed normal CBC and chemistry including glucose.Chest x-ray was unremarkable. Hospitalist consulted for observation overnight on telemetry for syncope. On my evaluation patient reports that he had bladder incontinence during the event which he found out Just now. Patient Reports having a remote syncopal episode (1  about 25 years ago) and the second one about 20 years ago when he had a septoplasty. 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 Patient denies any chest pain, palpitations, shortness of breath, headache, dizziness, fevers, chills, nausea, vomiting, abdominal pain, dysuria, diarrhea, tingling or numbness in his extremities, with recent travel, falls or illness. No recent change in his medications.        Review of systems:    In addition to the HPI above, No Fever-chills, No Headache, No changes with Vision or hearing, No problems swallowing food or Liquids, No Chest pain, Cough or Shortness of Breath, No Abdominal pain, No Nausea or Vommitting, Bowel movements are regular, No Blood in stool or Urine, No dysuria, No new skin rashes or bruises, No new joints pains-aches,  No new weakness, tingling, numbness in any extremity, No recent weight gain or loss, No polyuria, polydypsia or polyphagia, No significant Mental Stressors.  A full 10 point Review of Systems was done, except as stated above, all  other Review of Systems were negative.   With Past History of the following :    Past Medical History:  Diagnosis Date  . Allergy    Mold, cat dander  . Asthma    Childhood history  . BPH (benign prostatic hyperplasia)   . Cataract   . Coronary atherosclerosis of native coronary artery    a. 06/2012 Myoview:  No ischemia;  b. 06/25/2012 Cath:  LM nl, LAD 50p, 15m, D1 80 ost (small), D2 nl (small), LCX 30 ost, OM1 nl, RI 30 ost, RCA 50-60- ost, 54m, PDA minor irregs, PL nl (small), EF 65%.   Marland Kitchen GERD (gastroesophageal reflux disease)   . Hyperlipidemia   . Hypothyroidism   . Migraines   . 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 Skin cancer, basal cell   . Vasovagal syncope       Past Surgical History:  Procedure Laterality Date  . CARDIAC CATHETERIZATION    . COLONOSCOPY    . Deviated septum repair 1989    . SKIN CANCER  EXCISION     Under left eye basal cell      Social History:     Social History   Tobacco Use  . Smoking status: Never Smoker  . Smokeless tobacco: Never Used  Substance Use Topics  . Alcohol use: No    Alcohol/week: 0.0 oz     Lives - Home with wife  Mobility - Independent     Family History :     Family History  Problem Relation Age of Onset  . Heart disease Mother   . Transient ischemic attack Mother   . Stomach cancer Sister   . Colon polyps Brother   . Heart disease Brother   . Colon cancer Neg Hx       Home Medications:   Prior to Admission medications   Medication Sig Start Date End Date Taking? Authorizing Provider  acetaminophen (TYLENOL) 500 MG tablet Take 1,000 mg daily as needed by mouth for moderate pain or headache.    Yes [provider]  levothyroxine (SYNTHROID, LEVOTHROID) 88 MCG tablet Take 88 mcg daily before breakfast by mouth.   Yes [provider]  psyllium (METAMUCIL) 58.6 % packet Take 1 packet daily by mouth.   Yes [provider]  RABEprazole (ACIPHEX) 20 MG tablet Take 1 tablet (20 mg total) daily before breakfast by mouth. 09/09/17  Yes Rehman, Mechele Dawley, MD  ranitidine (ZANTAC) 300 MG tablet Take 300 mg at bedtime by mouth.   Yes [provider]  Saw Palmetto, Serenoa repens, 320 MG CAPS Take 640 mg at bedtime by mouth.    Yes [provider]  nitroGLYCERIN (NITROSTAT) 0.4 MG SL tablet Place 1 tablet (0.4 mg total) under the tongue every 5 (five) minutes as needed for chest pain. 08/27/15   Satira Sark, MD     Allergies:     Allergies  Allergen Reactions  . Cats Claw [Uncaria Tomentosa (Cats Claw)] Other (See Comments)    asthma  . Citrus Hives  . Dairy Aid [Lactase] Other (See Comments)    Scalp itches/face burns  . Nsaids Itching and Other (See Comments)    Possibly other NSAID'S, but ibuprofen for sure.   Marland Kitchen Shellfish-Derived Products Other (See Comments)    Rash   .  Ibuprofen Hives, Swelling and Rash     Physical Exam:   Vitals  Blood pressure 126/78, pulse 78, resp. rate (!) 22, weight  84.4 kg (186 lb), SpO2 98 %.   General : Elderly male lying in bed, appears fatigued, non-to distress HEENT:Pupils reactive bilaterally, EOMI,  No pallor, no icterus, moist oral mucosa, supple neck, Chest: Clear to auscultation bilaterally, no added sounds CVS: Normal S1 and S2, no murmurs or gallop GI: Soft, nondistended, nontender, bowel sounds present Skeletal skeletal: Warm, no edema CNS: Alert and oriented3, no focal    Data Review:    CBC Recent Labs  Lab 09/09/17 1210  WBC 4.7  HGB 12.9*  HCT 38.4*  PLT 152  MCV 90.8  MCH 30.5  MCHC 33.6  RDW 12.5  LYMPHSABS 1.8  MONOABS 0.4  EOSABS 0.1  BASOSABS 0.0   ------------------------------------------------------------------------------------------------------------------  Chemistries  Recent Labs  Lab 09/09/17 1210  NA 135  K 3.8  CL 104  CO2 24  GLUCOSE 113*  BUN 20  CREATININE 0.86  CALCIUM 7.8*  AST 19  ALT 20  ALKPHOS 43  BILITOT 1.0   ------------------------------------------------------------------------------------------------------------------ estimated creatinine clearance is 80.7 mL/min (by C-G formula based on SCr of 0.86 mg/dL). ------------------------------------------------------------------------------------------------------------------ No results for input(s): TSH, T4TOTAL, T3FREE, THYROIDAB in the last 72 hours.  Invalid input(s): FREET3  Coagulation profile No results for input(s): INR, PROTIME in the last 168 hours. ------------------------------------------------------------------------------------------------------------------- No results for input(s): DDIMER in the last 72 hours. -------------------------------------------------------------------------------------------------------------------  Cardiac Enzymes No results for input(s): CKMB,  TROPONINI, MYOGLOBIN in the last 168 hours.  Invalid input(s): CK ------------------------------------------------------------------------------------------------------------------ No results found for: BNP   ---------------------------------------------------------------------------------------------------------------  Urinalysis No results found for: COLORURINE, APPEARANCEUR, LABSPEC, PHURINE, GLUCOSEU, HGBUR, BILIRUBINUR, KETONESUR, PROTEINUR, UROBILINOGEN, NITRITE, LEUKOCYTESUR  ----------------------------------------------------------------------------------------------------------------   Imaging Results:    Dg Chest Port 1 View  Result Date: 09/09/2017 CLINICAL DATA:  Episode of unresponsiveness following upper endoscopy today. Lethargy. EXAM: PORTABLE CHEST 1 VIEW COMPARISON:  Chest CT 07/07/2012.  Radiographs 10/23/2014. FINDINGS: 1202 hours. Suboptimal inspiration. External pacer and telemetry leads overlie the chest. The heart size and mediastinal contours are normal. The lungs are clear. There is no pleural effusion or pneumothorax. No acute osseous findings are identified. IMPRESSION: No active cardiopulmonary process. Electronically Signed   By: Richardean Sale M.D.   On: 09/09/2017 12:20    My personal review of EKG: Normal sinus rhythm at 63, no ST-T changes   Assessment & Plan:    Principal Problem:   Syncope Differential includes vasovagal syncope versus arrhythmia versus postanesthesia effect versus seizures. Monitor on observation overnight on telemetry. Check 2-D echo to rule out any valve abnormality and EEG to rule out seizures. Neuro checks every 4 hours. Avoid AV nodal blocking agents given history of possible bradycardia during the event.  If asymptomatic and workup negative can be discharged home in a.m.  GERD Ranitidine, switch to Pepcid. On Aciphex at home. EGD done Today showing Small hiatal hernia and patchy congestion and erythema in the gastric  antrum, biopsy taken.  Hypothyroidism  continue Synthroid.  DVT Prophylaxis ; Lovenox  AM Labs Ordered, also please review Full Orders  Family Communication: Admission, patients condition and plan of care including tests being ordered have been discussed with the patient and  wife at bedside  Code Status full code  Likely DC to Home on 11/8  Condition : [place] called: None   Admission status: Observation   Time spent in minutes : 45   Louellen Molder M.D on 09/09/2017 at 3:30 PM  Between 7am to 7pm - Pager - [phone]. After 7pm go to [email or web address] - password [facility]  [facility] - Office  [phone]

## 2017-09-09 NOTE — Progress Notes (Signed)
Report to Angelina Pih, RN in ED

## 2017-09-09 NOTE — Discharge Instructions (Signed)
Take AcipHex 30 minutes before breakfast and Zantac or ranitidine every evening before meal. Resume other medications as before. Resume usual diet. No driving for 24 hours. Physician will call with biopsy results and further recommendations.        Esophagogastroduodenoscopy, Care After Refer to this sheet in the next few weeks. These instructions provide you with information about caring for yourself after your procedure. Your health care provider may also give you more specific instructions. Your treatment has been planned according to current medical practices, but problems sometimes occur. Call your health care provider if you have any problems or questions after your procedure. What can I expect after the procedure? After the procedure, it is common to have:  A sore throat.  Nausea.  Bloating.  Dizziness.  Fatigue.  Follow these instructions at home:  Do not eat or drink anything until the numbing medicine (local anesthetic) has worn off and your gag reflex has returned. You will know that the local anesthetic has worn off when you can swallow comfortably.  Do not drive for 24 hours if you received a medicine to help you relax (sedative).  If your health care provider took a tissue sample for testing during the procedure, make sure to get your test results. This is your responsibility. Ask your health care provider or the department performing the test when your results will be ready.  Keep all follow-up visits as told by your health care provider. This is important. Contact a health care provider if:  You cannot stop coughing.  You are not urinating.  You are urinating less than usual. Get help right away if:  You have trouble swallowing.  You cannot eat or drink.  You have throat or chest pain that gets worse.  You are dizzy or light-headed.  You faint.  You have nausea or vomiting.  You have chills.  You have a fever.  You have severe abdominal  pain.  You have black, tarry, or bloody stools. This information is not intended to replace advice given to you by your health care provider. Make sure you discuss any questions you have with your health care provider. Document Released: 10/06/2012 Document Revised: 03/27/2016 Document Reviewed: 09/13/2015 Elsevier Interactive Patient Education  2018 [place].    Hiatal Hernia A hiatal hernia occurs when part of the stomach slides above the muscle that separates the abdomen from the chest (diaphragm). A person can be born with a hiatal hernia (congenital), or it may develop over time. In almost all cases of hiatal hernia, only the top part of the stomach pushes through the diaphragm. Many people have a hiatal hernia with no symptoms. The larger the hernia, the more likely it is that you will have symptoms. In some cases, a hiatal hernia allows stomach acid to flow back into the tube that carries food from your mouth to your stomach (esophagus). This may cause heartburn symptoms. Severe heartburn symptoms may mean that you have developed a condition called gastroesophageal reflux disease (GERD). What are the causes? This condition is caused by a weakness in the opening (hiatus) where the esophagus passes through the diaphragm to attach to the upper part of the stomach. A person may be born with a weakness in the hiatus, or a weakness can develop over time. What increases the risk? This condition is more likely to develop in:  Older people. Age is a major risk factor for a hiatal hernia, especially if you are over the age of 89.  Pregnant women.  People who are overweight.  People who have frequent constipation.  What are the signs or symptoms? Symptoms of this condition usually develop in the form of GERD symptoms. Symptoms include:  Heartburn.  Belching.  Indigestion.  Trouble swallowing.  Coughing or wheezing.  Sore throat.  Hoarseness.  Chest pain.  Nausea and  vomiting.  How is this diagnosed? This condition may be diagnosed during testing for GERD. Tests that may be done include:  X-rays of your stomach or chest.  An upper gastrointestinal (GI) series. This is an X-ray exam of your GI tract that is taken after you swallow a chalky liquid that shows up clearly on the X-ray.  Endoscopy. This is a procedure to look into your stomach using a thin, flexible tube that has a tiny camera and light on the end of it.  How is this treated? This condition may be treated by:  Dietary and lifestyle changes to help reduce GERD symptoms.  Medicines. These may include: ? Over-the-counter antacids. ? Medicines that make your stomach empty more quickly. ? Medicines that block the production of stomach acid (H2 blockers). ? Stronger medicines to reduce stomach acid (proton pump inhibitors).  Surgery to repair the hernia, if other treatments are not helping.  If you have no symptoms, you may not need treatment. Follow these instructions at home: Lifestyle and activity  Do not use any products that contain nicotine or tobacco, such as cigarettes and e-cigarettes. If you need help quitting, ask your health care provider.  Try to achieve and maintain a healthy body weight.  Avoid putting pressure on your abdomen. Anything that puts pressure on your abdomen increases the amount of acid that may be pushed up into your esophagus. ? Avoid bending over, especially after eating. ? Raise the head of your bed by putting blocks under the legs. This keeps your head and esophagus higher than your stomach. ? Do not wear tight clothing around your chest or stomach. ? Try not to strain when having a bowel movement, when urinating, or when lifting heavy objects. Eating and drinking  Avoid foods that can worsen GERD symptoms. These may include: ? Fatty foods, like fried foods. ? Citrus fruits, like oranges or lemon. ? Other foods and drinks that contain acid, like  orange juice or tomatoes. ? Spicy food. ? Chocolate.  Eat frequent small meals instead of three large meals a day. This helps prevent your stomach from getting too full. ? Eat slowly. ? Do not lie down right after eating. ? Do not eat 1-2 hours before bed.  Do not drink beverages with caffeine. These include cola, coffee, cocoa, and tea.  Do not drink alcohol. General instructions  Take over-the-counter and prescription medicines only as told by your health care provider.  Keep all follow-up visits as told by your health care provider. This is important. Contact a health care provider if:  Your symptoms are not controlled with medicines or lifestyle changes.  You are having trouble swallowing.  You have coughing or wheezing that will not go away. Get help right away if:  Your pain is getting worse.  Your pain spreads to your arms, neck, jaw, teeth, or back.  You have shortness of breath.  You sweat for no reason.  You feel sick to your stomach (nauseous) or you vomit.  You vomit blood.  You have bright red blood in your stools.  You have black, tarry stools. This information is not intended to replace advice  given to you by your health care provider. Make sure you discuss any questions you have with your health care provider. Document Released: 01/10/2004 Document Revised: 10/13/2016 Document Reviewed: 10/13/2016 Elsevier Interactive Patient Education  [employer].

## 2017-09-09 NOTE — H&P (Signed)
Brad Cruz is an 71 y.o. male.   Chief Complaint: Patient is here for EGD. HPI: Patient is 71 year old Caucasian male who has over 20-year history of GERD.  He had a EGD done and was told he had hiatal hernia.  He took medication and did fine.  He relocated to [facility] and did not require any medication for a while.  For the last few months he has had problems with heartburn regurgitation and postprandial bloating.  He also has intermittent epigastric and chest pain.  About 5 years ago he had cardiac cath and his pain was felt to be known cardiac.  He has tried pantoprazole and Nexium but did not experience significant benefit.  He is now on AcipHex and 80% better.  He denies dysphagia or melena or rectal bleeding.  He has good appetite and his weight has been stable.  He does not drink alcohol or smoke cigarettes.  He does not take OTC NSAIDs.  Past Medical History:  Diagnosis Date  . Allergy    Mold, cat dander  . Asthma    Childhood history  . BPH (benign prostatic hyperplasia)   . Cataract   . Coronary atherosclerosis of native coronary artery    a. 06/2012 Myoview:  No ischemia;  b. 06/25/2012 Cath:  LM nl, LAD 50p, 79m, D1 80 ost (small), D2 nl (small), LCX 30 ost, OM1 nl, RI 30 ost, RCA 50-60- ost, 36m, PDA minor irregs, PL nl (small), EF 65%.   Marland Kitchen GERD (gastroesophageal reflux disease)   . Hyperlipidemia   . Hypothyroidism   . Migraines   . 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 Skin cancer, basal cell   . Vasovagal syncope     Past Surgical History:  Procedure Laterality Date  . CARDIAC CATHETERIZATION    . COLONOSCOPY    . Deviated septum repair 1989    . SKIN CANCER EXCISION     Under left eye basal cell    Family History  Problem Relation Age of Onset  . Heart disease Mother   . Transient ischemic attack Mother   . Stomach cancer Sister   . Colon polyps Brother   . Heart disease Brother   . Colon cancer Neg Hx    Social History:  reports that  has  never smoked. he has never used smokeless tobacco. He reports that he does not drink alcohol or use drugs.  Allergies:  Allergies  Allergen Reactions  . Cats Claw [Uncaria Tomentosa (Cats Claw)] Other (See Comments)    asthma  . Citrus Hives  . Dairy Aid [Lactase] Other (See Comments)    Scalp itches/face burns  . Nsaids Itching and Other (See Comments)    Possibly other NSAID'S, but ibuprofen for sure.   Marland Kitchen Shellfish-Derived Products Other (See Comments)    Rash   . Ibuprofen Hives, Swelling and Rash    Medications Prior to Admission  Medication Sig Dispense Refill  . acetaminophen (TYLENOL) 500 MG tablet Take 1,000 mg daily as needed by mouth for moderate pain or headache.     . Camphor-Menthol-Methyl Sal (HM SALONPAS PAIN RELIEF EX) Apply 1 application daily as needed topically (muscle pain).    Marland Kitchen levothyroxine (SYNTHROID, LEVOTHROID) 88 MCG tablet Take 88 mcg daily before breakfast by mouth.    . psyllium (METAMUCIL) 58.6 % packet Take 1 packet daily by mouth.    . RABEprazole (ACIPHEX) 20 MG tablet Take 20 mg daily by mouth.     Marland Kitchen  ranitidine (ZANTAC) 300 MG tablet Take 300 mg at bedtime by mouth.    . Saw Palmetto, Serenoa repens, 320 MG CAPS Take 640 mg at bedtime by mouth.     . nitroGLYCERIN (NITROSTAT) 0.4 MG SL tablet Place 1 tablet (0.4 mg total) under the tongue every 5 (five) minutes as needed for chest pain. 25 tablet 3  . pantoprazole (PROTONIX) 40 MG tablet Take 1 tablet (40 mg total) daily by mouth. 60 tablet 3    No results found for this or any previous visit (from the past 48 hour(s)). No results found.  ROS  Blood pressure 139/75, pulse 66, temperature (!) 97.4 F (36.3 C), temperature source Oral, resp. rate 18, height 5' 9.75" (1.772 m), weight 186 lb (84.4 kg), SpO2 99 %. Physical Exam  Constitutional: He appears well-developed and well-nourished.  HENT:  Mouth/Throat: Oropharynx is clear and moist.  Eyes: Conjunctivae are normal. No scleral icterus.   Neck: No thyromegaly present.  Cardiovascular: Normal rate, regular rhythm and normal heart sounds.  No murmur heard. Respiratory: Effort normal and breath sounds normal.  GI: Soft. He exhibits no distension and no mass. There is tenderness. There is no guarding.  Mild midepigastric tenderness.  Musculoskeletal: He exhibits no edema.  Lymphadenopathy:    He has no cervical adenopathy.  Neurological: He is alert.  Skin: Skin is warm and dry.     Assessment/Plan Chronic GERD with recent flareup. Diagnostic EGD.  Hildred Laser, MD 09/09/2017, 10:21 AM

## 2017-09-09 NOTE — ED Provider Notes (Signed)
[facility] EMERGENCY DEPARTMENT Provider Note   CSN: [id number] Arrival date & time: 09/09/17  1147     History   Chief Complaint Chief Complaint  Patient presents with  . Loss of Consciousness    HPI Brad Cruz is a 71 y.o. male.  Responded to endoscopy suite recovery area for a CODE BLUE.  When I arrived patient had pulse.  Cardiac monitor showed a sinus bradycardia.  Patient was receiving oxygen assist through amp bag by anesthesia.  Patient had oral airway in place.  Blood sugar had been checked and it was above 100.  Patient's breathing was shallow but he was breathing on his own.  All in all patient was not responsive.  Patient received Narcan and did wake up.  Patient transported to the emergency department for further evaluation.  Prior to this patient had been prepared for discharge had been awake and had something to drink and had been disconnected from the monitors and also had his IV taken out.  So patient had seemed to have recovered from the procedure.  He had an upper endoscopy done no complicating factors.  Everything went well.  The reason for the upper endoscopy was he has a history of long-standing GERD.  With Dr. Melony Overly was his GI doctor.  Consulted with him said everything was doing fine.  He was actually at the bedside when I arrived.  After receiving the Narcan patient woke up pretty quickly came back to normal and had no recollection of what occurred.  Heart rate did come up into the 60s.  Blood pressure was fine prior to him waking up with the Narcan.  He was fine afterwards.  Seemed as if he responded to Narcan but the historical facts leading up to this made it sound like may be more there was a syncopal episode.  When patient woke up he had no complaints.  Neurologically was intact no chest pain.  Just felt a little tired.  There was no witnessed seizure activity.      Past Medical History:  Diagnosis Date  . Allergy    Mold, cat dander  . Asthma      Childhood history  . BPH (benign prostatic hyperplasia)   . Cataract   . Coronary atherosclerosis of native coronary artery    a. 06/2012 Myoview:  No ischemia;  b. 06/25/2012 Cath:  LM nl, LAD 50p, 32m, D1 80 ost (small), D2 nl (small), LCX 30 ost, OM1 nl, RI 30 ost, RCA 50-60- ost, 19m, PDA minor irregs, PL nl (small), EF 65%.   Marland Kitchen GERD (gastroesophageal reflux disease)   . Hyperlipidemia   . Hypothyroidism   . Migraines   . 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 Skin cancer, basal cell   . Vasovagal syncope     Patient Active Problem List   Diagnosis Date Noted  . Gastroesophageal reflux disease without esophagitis 09/08/2017  . CAD (coronary artery disease), native coronary artery 07/26/2012  . GERD (gastroesophageal reflux disease)   . Hyperlipidemia   . Asthma     Past Surgical History:  Procedure Laterality Date  . CARDIAC CATHETERIZATION    . COLONOSCOPY    . Deviated septum repair 1989    . SKIN CANCER EXCISION     Under left eye basal cell       Home Medications    Prior to Admission medications   Medication Sig Start Date End Date Taking? Authorizing Provider  acetaminophen (TYLENOL)  500 MG tablet Take 1,000 mg daily as needed by mouth for moderate pain or headache.    Yes [provider]  levothyroxine (SYNTHROID, LEVOTHROID) 88 MCG tablet Take 88 mcg daily before breakfast by mouth.   Yes [provider]  psyllium (METAMUCIL) 58.6 % packet Take 1 packet daily by mouth.   Yes [provider]  RABEprazole (ACIPHEX) 20 MG tablet Take 1 tablet (20 mg total) daily before breakfast by mouth. 09/09/17  Yes Rehman, Mechele Dawley, MD  ranitidine (ZANTAC) 300 MG tablet Take 300 mg at bedtime by mouth.   Yes [provider]  Saw Palmetto, Serenoa repens, 320 MG CAPS Take 640 mg at bedtime by mouth.    Yes [provider]  nitroGLYCERIN (NITROSTAT) 0.4 MG SL tablet Place 1 tablet (0.4 mg total) under the tongue every 5  (five) minutes as needed for chest pain. 08/27/15   Satira Sark, MD    Family History Family History  Problem Relation Age of Onset  . Heart disease Mother   . Transient ischemic attack Mother   . Stomach cancer Sister   . Colon polyps Brother   . Heart disease Brother   . Colon cancer Neg Hx     Social History Social History   Tobacco Use  . Smoking status: Never Smoker  . Smokeless tobacco: Never Used  Substance Use Topics  . Alcohol use: No    Alcohol/week: 0.0 oz  . Drug use: No     Allergies   Cats claw [uncaria tomentosa (cats claw)]; Citrus; Dairy aid [lactase]; Nsaids; Shellfish-derived products; and Ibuprofen   Review of Systems Review of Systems  Constitutional: Negative for fever.  HENT: Negative for congestion.   Eyes: Negative for visual disturbance.  Respiratory: Negative for chest tightness and shortness of breath.   Cardiovascular: Negative for chest pain.  Gastrointestinal: Negative for abdominal pain.  Genitourinary: Negative for hematuria.  Skin: Negative for rash.  Allergic/Immunologic: Negative for immunocompromised state.  Neurological: Positive for syncope. Negative for headaches.  Hematological: Does not bruise/bleed easily.     Physical Exam Updated Vital Signs BP 112/78 (BP Location: Left Arm)   Pulse (!) 59   Resp 13   Wt 84.4 kg (186 lb)   SpO2 98%   BMI 26.88 kg/m   Physical Exam  Constitutional: He is oriented to person, place, and time. He appears well-developed and well-nourished. No distress.  HENT:  Head: Normocephalic and atraumatic.  Mouth/Throat: Oropharynx is clear and moist.  Eyes: Conjunctivae and EOM are normal. Pupils are equal, round, and reactive to light.  Neck: Normal range of motion. Neck supple.  Cardiovascular: Normal rate, regular rhythm and normal heart sounds.  Pulmonary/Chest: Effort normal and breath sounds normal. No respiratory distress.  Abdominal: Soft. Bowel sounds are normal. There is  no tenderness.  Musculoskeletal: Normal range of motion.  Neurological: He is alert and oriented to person, place, and time. No cranial nerve deficit or sensory deficit. He exhibits normal muscle tone. Coordination normal.  Skin: Skin is warm.  Nursing note and vitals reviewed.    ED Treatments / Results  Labs (all labs ordered are listed, but only abnormal results are displayed) Labs Reviewed  CBC WITH DIFFERENTIAL/PLATELET - Abnormal; Notable for the following components:      Result Value   Hemoglobin 12.9 (*)    HCT 38.4 (*)    All other components within normal limits  COMPREHENSIVE METABOLIC PANEL - Abnormal; Notable for the following components:  Glucose, Bld 113 (*)    Calcium 7.8 (*)    Total Protein 5.9 (*)    All other components within normal limits    EKG  EKG Interpretation None       Radiology Dg Chest Port 1 View  Result Date: 09/09/2017 CLINICAL DATA:  Episode of unresponsiveness following upper endoscopy today. Lethargy. EXAM: PORTABLE CHEST 1 VIEW COMPARISON:  Chest CT 07/07/2012.  Radiographs 10/23/2014. FINDINGS: 1202 hours. Suboptimal inspiration. External pacer and telemetry leads overlie the chest. The heart size and mediastinal contours are normal. The lungs are clear. There is no pleural effusion or pneumothorax. No acute osseous findings are identified. IMPRESSION: No active cardiopulmonary process. Electronically Signed   By: Richardean Sale M.D.   On: 09/09/2017 12:20    Procedures Procedures (including critical care time)  CRITICAL CARE Performed by: Fredia Sorrow Total critical care time: 30  minutes Critical care time was exclusive of separately billable procedures and treating other patients. Critical care was necessary to treat or prevent imminent or life-threatening deterioration. Critical care was time spent personally by me on the following activities: development of treatment plan with patient and/or surrogate as well as nursing,  discussions with consultants, evaluation of patient's response to treatment, examination of patient, obtaining history from patient or surrogate, ordering and performing treatments and interventions, ordering and review of laboratory studies, ordering and review of radiographic studies, pulse oximetry and re-evaluation of patient's condition.  Medications Ordered in ED Medications  0.9 %  sodium chloride infusion ( Intravenous New Bag/Given 09/09/17 1206)     Initial Impression / Assessment and Plan / ED Course  I have reviewed the triage vital signs and the nursing notes.  Pertinent labs & imaging results that were available during my care of the patient were reviewed by me and considered in my medical decision making (see chart for details).    Review of systems and physical exam documented after patient had woken up.  No acute focal findings.  Patient without any specific complaints other than feeling a little tired.  Patient did have a similar episode of syncope in the remote past.  While at church.  No evaluation for this.  Possible patient could have had an arrhythmia because he was not on the monitor when occurred.  However since that time cardiac monitoring is showed no arrhythmias.  She is also possible that there was some sort of delayed effect to the conscious sedation medications.  Either way patient doing fine patient does warrant cardiac monitoring and observation overnight.  Hospitalist agree.  Patient's family updated both in the endoscopy suite and here in the emergency department as to our plans.  Labs without significant abnormalities.  Chest x-ray negative.  Final Clinical Impressions(s) / ED Diagnoses   Final diagnoses:  Syncope, unspecified syncope type    ED Discharge Orders    None       Fredia Sorrow, MD 09/09/17 1433

## 2017-09-09 NOTE — Progress Notes (Signed)
Patient is 71 year old Caucasian male who underwent diagnostic EGD because of chronic GERD symptoms with unsatisfactory response to therapy.  He was given lidocaine for topical oropharyngeal anesthesia Demerol 50 mg IV in divided doses and Versed 5 mg IV in divided dose.  Procedure was uneventful.  Patient did fine in recovery.  He was moved from stretcher to recliner.  He was alert and drinking liquids.  IV was taken out.  Then all of a sudden his breathing became agonal and he became unresponsive.  CODE BLUE was called and he was quickly moved back to the stretcher.  He never lost pulse.  Breathing supported with Ambu bag.  He was given Narcan 0.4 mg IV.  Shortly thereafter he was awake and responded appropriately.  Rhythm revealed a normal sinus rhythm and EKG did not show any acute changes. He did not require intubation or CPR. He was transferred to ER for further evaluation. Sequence of events was explained to patient's wife immediately after this event.

## 2017-09-09 NOTE — Op Note (Signed)
[facility] Patient Name: Brad Cruz Procedure Date: 09/09/2017 10:00 AM MRN: [id number] Date of Birth: [birth year] Attending MD: Hildred Laser , MD CSN: [id number] Age: 71 Admit Type: Outpatient Procedure:                Upper GI endoscopy Indications:              Follow-up of gastro-esophageal reflux disease Providers:                Hildred Laser, MD, Otis Peak B. Sharon Seller, RN, Janeece Riggers, RN Referring MD:             Glenda Chroman, MD Medicines:                Lidocaine spray, Meperidine 50 mg IV, Midazolam 5                            mg IV Complications:            No immediate complications. Estimated Blood Loss:     Estimated blood loss was minimal. Procedure:                Pre-Anesthesia Assessment:                           - Prior to the procedure, a History and Physical                            was performed, and patient medications and                            allergies were reviewed. The patient's tolerance of                            previous anesthesia was also reviewed. The risks                            and benefits of the procedure and the sedation                            options and risks were discussed with the patient.                            All questions were answered, and informed consent                            was obtained. Prior Anticoagulants: The patient has                            taken no previous anticoagulant or antiplatelet                            agents. ASA Grade Assessment: II - A patient with  mild systemic disease. After reviewing the risks                            and benefits, the patient was deemed in                            satisfactory condition to undergo the procedure.                           After obtaining informed consent, the endoscope was                            passed under direct vision. Throughout the                            procedure,  the patient's blood pressure, pulse, and                            oxygen saturations were monitored continuously. The                            EG-[id number] ([id number]) scope was introduced through the                            mouth, and advanced to the second part of duodenum.                            The upper GI endoscopy was accomplished without                            difficulty. The patient tolerated the procedure                            well. Scope In: 10:30:58 AM Scope Out: 10:38:17 AM Total Procedure Duration: 0 hours 7 minutes 19 seconds  Findings:      The examined esophagus was normal.      The Z-line was regular and was found 38 cm from the incisors.      A 2 cm hiatal hernia was present.      Patchy mild inflammation characterized by congestion (edema) and       erythema was found in the gastric antrum. Biopsies were taken with a       cold forceps for histology.      The exam of the stomach was otherwise normal.      The duodenal bulb and second portion of the duodenum were normal. Impression:               - Normal esophagus.                           - Z-line regular, 38 cm from the incisors.                           - 2 cm hiatal hernia.                           -  Gastritis. Biopsied.                           - Normal duodenal bulb and second portion of the                            duodenum. Moderate Sedation:      Moderate (conscious) sedation was administered by the endoscopy nurse       and supervised by the endoscopist. The following parameters were       monitored: oxygen saturation, heart rate, blood pressure, CO2       capnography and response to care. Total physician intraservice time was       13 minutes. Recommendation:           - Patient has a contact number available for                            emergencies. The signs and symptoms of potential                            delayed complications were discussed with the                             patient. Return to normal activities tomorrow.                            Written discharge instructions were provided to the                            patient.                           - Resume previous diet today.                           - Continue present medications.                           - Await pathology results. Procedure Code(s):        --- Professional ---                           [phone], Esophagogastroduodenoscopy, flexible,                            transoral; with biopsy, single or multiple                           99152, Moderate sedation services provided by the                            same physician or other qualified health care                            professional performing the diagnostic or  therapeutic service that the sedation supports,                            requiring the presence of an independent trained                            observer to assist in the monitoring of the                            patient's level of consciousness and physiological                            status; initial 15 minutes of intraservice time,                            patient age 48 years or older Diagnosis Code(s):        --- Professional ---                           K44.9, Diaphragmatic hernia without obstruction or                            gangrene                           K29.70, Gastritis, unspecified, without bleeding                           K21.9, Gastro-esophageal reflux disease without                            esophagitis CPT copyright 2016 [employer]. All rights reserved. The codes documented in this report are preliminary and upon coder review may  be revised to meet current compliance requirements. Hildred Laser, MD Hildred Laser, MD 09/09/2017 10:49:21 AM This report has been signed electronically. Number of Addenda: 0

## 2017-09-10 ENCOUNTER — Observation Stay (HOSPITAL_BASED_OUTPATIENT_CLINIC_OR_DEPARTMENT_OTHER): Payer: Medicare Other

## 2017-09-10 ENCOUNTER — Observation Stay (HOSPITAL_BASED_OUTPATIENT_CLINIC_OR_DEPARTMENT_OTHER)
Admit: 2017-09-10 | Discharge: 2017-09-10 | Disposition: A | Discharge status: Home / Self Care | Payer: Medicare Other | Attending: Internal Medicine | Admitting: Internal Medicine

## 2017-09-10 DIAGNOSIS — G9340 Encephalopathy, unspecified: Secondary | ICD-10-CM | POA: Diagnosis not present

## 2017-09-10 DIAGNOSIS — I340 Nonrheumatic mitral (valve) insufficiency: Secondary | ICD-10-CM | POA: Diagnosis not present

## 2017-09-10 DIAGNOSIS — R55 Syncope and collapse: Secondary | ICD-10-CM | POA: Diagnosis not present

## 2017-09-10 DIAGNOSIS — K219 Gastro-esophageal reflux disease without esophagitis: Secondary | ICD-10-CM | POA: Diagnosis not present

## 2017-09-10 LAB — ECHOCARDIOGRAM COMPLETE
AVLVOTPG: 6 mmHg
CHL CUP DOP CALC LVOT VTI: 27.2 cm
CHL CUP MV DEC (S): 187
CHL CUP STROKE VOLUME: 50 mL
E/e' ratio: 6.87
EWDT: 187 ms
FS: 30 % (ref 28–44)
HEIGHTINCHES: 69 in
IVS/LV PW RATIO, ED: 1.01
LA diam end sys: 27 mm
LADIAMINDEX: 1.31 cm/m2
LASIZE: 27 mm
LAVOL: 48.2 mL
LAVOLA4C: 47.2 mL
LAVOLIN: 23.4 mL/m2
LV E/e' medial: 6.87
LV E/e'average: 6.87
LV PW d: 11.7 mm — AB (ref 0.6–1.1)
LV SIMPSON'S DISK: 71
LV TDI E'LATERAL: 12.9
LV TDI E'MEDIAL: 9.46
LV dias vol index: 34 mL/m2
LV dias vol: 69 mL (ref 62–150)
LV e' LATERAL: 12.9 cm/s
LVOT SV: 85 mL
LVOT area: 3.14 cm2
LVOT peak vel: 119 cm/s
LVOTD: 20 mm
LVSYSVOL: 20 mL — AB (ref 21–61)
LVSYSVOLIN: 10 mL/m2
MV Peak grad: 3 mmHg
MVPKAVEL: 61.8 m/s
MVPKEVEL: 88.6 m/s
RV LATERAL S' VELOCITY: 11.6 cm/s
RV TAPSE: 26.9 mm
RV sys press: 33 mmHg
Reg peak vel: 252 cm/s
TRMAXVEL: 252 cm/s
VTI: 170 cm
WEIGHTICAEL: 3024 [oz_av]

## 2017-09-10 NOTE — Procedures (Signed)
HPI:  71 y/o with MS change following procedure  TECHNICAL SUMMARY:  A multichannel referential and bipolar montage EEG using the standard international 10-20 system was performed on the patient described as awake and briefly drowsy.  The dominant background activity consists of 8.5 hertz activity seen most prominantly over the posterior head region.  The backgound activity is reactive to eye opening and closing procedures.  Low voltage fast (beta) activity is distributed symmetrically and maximally over the anterior head regions.  ACTIVATION:  Stepwise photic stimulation at 4-20 flashes per second was performed and did not elicit any abnormal waveforms.  Hyperventilation was not performed.  EPILEPTIFORM ACTIVITY:  There were no spikes, sharp waves or paroxysmal activity.  SLEEP: Brief physiologic drowsiness is noted, but no stage II sleep.  CARDIAC:  The EKG lead revealed a regular sinus rhythm.  IMPRESSION:  This is a normal EEG for the patients stated age.  There were no focal, hemispheric or lateralizing features.  No epileptiform activity was recorded.  A normal EEG does not exclude the diagnosis of a seizure disorder and if seizure remains high on the list of differential diagnosis, an ambulatory EEG may be of value.  Clinical correlation is required.

## 2017-09-10 NOTE — Care Management Obs Status (Signed)
[place] NOTIFICATION   Patient Details  Name: MIKAEEL PETROW MRN: [id number] Date of Birth: 09-01-1946   Medicare Observation Status Notification Given:  No    Merriel Zinger, Chauncey Reading, RN 09/10/2017, 1:08 PM

## 2017-09-10 NOTE — Progress Notes (Signed)
Patient discharged with personal belongings, IV removed and site intact. Discharge papers given to patient.

## 2017-09-10 NOTE — Progress Notes (Signed)
[name]  [name] HER:[id number] DOB: [birth year] DOA: 09/09/2017 PCP: Glenda Chroman, MD  Brief summary    Brad Cruz  is a 71 y.o. male, With a history of Long-standing GERD and hiatal hernia who Underwent elective endoscopy today. Postprocedure he was stable and laced in the recovery area getting ready to be discharged home. He was hemodynamically stable and his IV line was discontinued. He was given some water to drink and after taking a few sips started gurgling and became cyanotic and slowly unresponsive.CODE BLUE was called and patient quickly moved back to the stretcher. Gastroenterologist examined the patient at the same time and patient had pulse. He was found to be bradycardic and patient placed on Ambu bag to support breathing. He was given 0.4 mg of IV Narcan and slowly became awake and responded appropriately. Heart monitor rhythm slowly showed a sinus rhythm and EKG done without any acute findings. Patient sent to ED for further evaluation. patient had received Lidocaine for oropharyngeal anesthesia along with Demerol 50 minute him IV in divided doses and Versed 5 mg IV in divided doses. Patient has no recollection of symptoms.Subsequent vitals in the ED were stable. Blood work showed normal CBC and chemistry including glucose.Chest x-ray was unremarkable. Hospitalist consulted for observation overnight on telemetry for syncope. On my evaluation patient reports that he had bladder incontinence during the event which he found out Just now. Patient Reports having a remote syncopal episode (1 about 25 years ago) and the second one about 20 years ago when he had a septoplasty. 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 Patient denies any chest pain, palpitations, shortness of breath, headache, dizziness, fevers, chills, nausea, vomiting, abdominal pain, dysuria, diarrhea, tingling or numbness in his extremities, with  recent travel, falls or illness. No recent change in his medications.     Assessment/Plan:  Syncope. Most likely due to vasovagal syncope post postanesthesia effect vs situational (previsouly similar symptom after rhinoplasty). Neuro exam is non focal. Tele without significant arrhythmias. Echo: LVEF 55-60%, LVH. Pend EEG  GERD.  EGD done Today showing Small hiatal hernia and patchy congestion and erythema in the gastric antrum, biopsy taken.  Hypothyroidism.  continue Synthroid.  Dispo: home today   Code Status: full Family Communication: d/w patient, his family. RN (indicate person spoken with, relationship, and if by phone, the number) Disposition Plan: home 24 hrs    Consultants:  none  Procedures:  Echo  eeg  Antibiotics:  none (indicate start date, and stop date if known)  HPI/Subjective: Alert. No distress. No new episodes of syncope   Objective: Vitals:   09/10/17 0000 09/10/17 0400  BP: 125/78 (!) 112/59  Pulse: 75 70  Resp: 19 18  Temp: (!) 97.3 F (36.3 C) 98.4 F (36.9 C)  SpO2: 96% 97%    Intake/Output Summary (Last 24 hours) at 09/10/2017 1133 Last data filed at 09/10/2017 0655 Gross per 24 hour  Intake 1380 ml  Output 1700 ml  Net -320 ml   Filed Weights   09/09/17 1201 09/09/17 1608  Weight: 84.4 kg (186 lb) 85.7 kg (189 lb)    Exam:   General:  No distress  Cardiovascular: s1,s2 rrr  Respiratory: CTA BL  Abdomen: soft, nt, nd   Musculoskeletal: no leg edema    Data Reviewed: Basic Metabolic Panel: Recent Labs  Lab 09/09/17 1210  NA 135  K 3.8  CL 104  CO2 24  GLUCOSE 113*  BUN 20  CREATININE 0.86  CALCIUM 7.8*   Liver Function Tests: Recent Labs  Lab 09/09/17 1210  AST 19  ALT 20  ALKPHOS 43  BILITOT 1.0  PROT 5.9*  ALBUMIN 3.5   No results for input(s): LIPASE, AMYLASE in the last 168 hours. No results for input(s): AMMONIA in the last 168 hours. CBC: Recent Labs  Lab 09/09/17 1210  WBC 4.7   NEUTROABS 2.4  HGB 12.9*  HCT 38.4*  MCV 90.8  PLT 152   Cardiac Enzymes: No results for input(s): CKTOTAL, CKMB, CKMBINDEX, TROPONINI in the last 168 hours. BNP (last 3 results) No results for input(s): BNP in the last 8760 hours.  ProBNP (last 3 results) No results for input(s): PROBNP in the last 8760 hours.  CBG: Recent Labs  Lab 09/09/17 1132  GLUCAP 126*    No results found for this or any previous visit (from the past 240 hour(s)).   Studies: Dg Chest Port 1 View  Result Date: 09/09/2017 CLINICAL DATA:  Episode of unresponsiveness following upper endoscopy today. Lethargy. EXAM: PORTABLE CHEST 1 VIEW COMPARISON:  Chest CT 07/07/2012.  Radiographs 10/23/2014. FINDINGS: 1202 hours. Suboptimal inspiration. External pacer and telemetry leads overlie the chest. The heart size and mediastinal contours are normal. The lungs are clear. There is no pleural effusion or pneumothorax. No acute osseous findings are identified. IMPRESSION: No active cardiopulmonary process. Electronically Signed   By: Richardean Sale M.D.   On: 09/09/2017 12:20    Scheduled Meds: . enoxaparin (LOVENOX) injection  40 mg Subcutaneous Q24H  . famotidine  20 mg Oral BID  . levothyroxine  88 mcg Oral QAC breakfast  . psyllium  1 packet Oral Daily  . sodium chloride flush  3 mL Intravenous Q12H   Continuous Infusions: . sodium chloride 75 mL/hr at 09/10/17 0310    Principal Problem:   Syncope    Time spent: >35 minutes     [name]  [facility] Pager [phone]. If 7PM-7AM, please contact night-coverage at [email or web address], password [facility] 09/10/2017, 11:33 AM  LOS: 0 days

## 2017-09-10 NOTE — Progress Notes (Signed)
*  PRELIMINARY RESULTS* Echocardiogram 2D Echocardiogram has been performed.  Samuel Germany 09/10/2017, 10:33 AM

## 2017-09-10 NOTE — Progress Notes (Signed)
EEG completed; results pending.    

## 2017-09-10 NOTE — Discharge Summary (Signed)
Physician Discharge Summary  Brad Cruz IWP:[id number] DOB: 06-01-1946 DOA: 09/09/2017  PCP: Glenda Chroman, MD  Admit date: 09/09/2017 Discharge date: 09/10/2017  Time spent: >35 minutes  Recommendations for Outpatient Follow-up:  PCP in 3-7 days as needed  Discharge Diagnoses:  Principal Problem:   Syncope   Discharge Condition: stable   Diet recommendation: regular   Filed Weights   09/09/17 1201 09/09/17 1608  Weight: 84.4 kg (186 lb) 85.7 kg (189 lb)    History of present illness:  DavidDilleyis a71 y.o.male,With a history of Long-standing GERD and hiatal hernia who Underwent elective endoscopy today. Postprocedure he was stable and laced in the recovery area getting ready to be discharged home. He was hemodynamically stable and his IV line was discontinued. He was given some water to drink and after taking a few sips started gurgling and became cyanotic and slowly unresponsive.CODE BLUE was called and patient quickly moved back to the stretcher. Gastroenterologist examined the patient at the same time and patient had pulse. He was found to be bradycardic and patient placed on Ambu bag to support breathing. He was given 0.4 mg of IV Narcan and slowly became awake and responded appropriately. Heart monitor rhythm slowly showed a sinus rhythm and EKG done without any acute findings. Patient sent to ED for further evaluation. patient had received Lidocaine for oropharyngeal anesthesia along with Demerol 50 minute him IV in divided doses and Versed 5 mg IV in divided doses. Patient has no recollection of symptoms.Subsequent vitals in the ED were stable. Blood work showed normal CBC and chemistry including glucose.Chest x-ray was unremarkable. Hospitalist consulted for observation overnight on telemetry for syncope.    Hospital Course:   Syncope. Most likely due to vasovagal syncope post postanesthesia effect vs anesthetic effect or situational (previsouly similar symptom after  rhinoplasty).  -no new episodes. neuro exam is non focal. Tele without significant arrhythmias. Echo: LVEF 55-60%, LVH.  EEG-no acute findings. Recommended to f/u with PCP in 1-2 weeks   GERD.  EGD done Today showing Small hiatal hernia and patchy congestion and erythema in the gastric antrum, biopsy taken.  Hypothyroidism. continue Synthroid.    Procedures:  Echo  eeg   (i.e. Studies not automatically included, echos, thoracentesis, etc; not x-rays)  Consultations:  none  Discharge Exam: Vitals:   09/10/17 0000 09/10/17 0400  BP: 125/78 (!) 112/59  Pulse: 75 70  Resp: 19 18  Temp: (!) 97.3 F (36.3 C) 98.4 F (36.9 C)  SpO2: 96% 97%    General: alert. No dsitress  Cardiovascular: s1,s2 rrr Respiratory: CTA BL  Discharge Instructions  Discharge Instructions    Diet - low sodium heart healthy   Complete by:  As directed    Increase activity slowly   Complete by:  As directed      Allergies as of 09/10/2017      Reactions   Cats Claw [uncaria Tomentosa (cats Claw)] Other (See Comments)   asthma   Citrus Hives   Dairy Aid [lactase] Other (See Comments)   Scalp itches/face burns   Nsaids Itching, Other (See Comments)   Possibly other NSAID'S, but ibuprofen for sure.    Shellfish-derived Products Other (See Comments)   Rash   Ibuprofen Hives, Swelling, Rash      Medication List    TAKE these medications   acetaminophen 500 MG tablet Commonly known as:  TYLENOL Take 1,000 mg daily as needed by mouth for moderate pain or headache.   levothyroxine 88 MCG  tablet Commonly known as:  SYNTHROID, LEVOTHROID Take 88 mcg daily before breakfast by mouth.   nitroGLYCERIN 0.4 MG SL tablet Commonly known as:  NITROSTAT Place 1 tablet (0.4 mg total) under the tongue every 5 (five) minutes as needed for chest pain.   psyllium 58.6 % packet Commonly known as:  METAMUCIL Take 1 packet daily by mouth.   RABEprazole 20 MG tablet Commonly known as:   ACIPHEX Take 1 tablet (20 mg total) daily before breakfast by mouth.   ranitidine 300 MG tablet Commonly known as:  ZANTAC Take 300 mg at bedtime by mouth.   Saw Palmetto (Serenoa repens) 320 MG Caps Take 640 mg at bedtime by mouth.      Allergies  Allergen Reactions  . Cats Claw [Uncaria Tomentosa (Cats Claw)] Other (See Comments)    asthma  . Citrus Hives  . Dairy Aid [Lactase] Other (See Comments)    Scalp itches/face burns  . Nsaids Itching and Other (See Comments)    Possibly other NSAID'S, but ibuprofen for sure.   Marland Kitchen Shellfish-Derived Products Other (See Comments)    Rash   . Ibuprofen Hives, Swelling and Rash      The results of significant diagnostics from this hospitalization (including imaging, microbiology, ancillary and laboratory) are listed below for reference.    Significant Diagnostic Studies: Dg Chest Port 1 View  Result Date: 09/09/2017 CLINICAL DATA:  Episode of unresponsiveness following upper endoscopy today. Lethargy. EXAM: PORTABLE CHEST 1 VIEW COMPARISON:  Chest CT 07/07/2012.  Radiographs 10/23/2014. FINDINGS: 1202 hours. Suboptimal inspiration. External pacer and telemetry leads overlie the chest. The heart size and mediastinal contours are normal. The lungs are clear. There is no pleural effusion or pneumothorax. No acute osseous findings are identified. IMPRESSION: No active cardiopulmonary process. Electronically Signed   By: Richardean Sale M.D.   On: 09/09/2017 12:20    Microbiology: No results found for this or any previous visit (from the past 240 hour(s)).   Labs: Basic Metabolic Panel: Recent Labs  Lab 09/09/17 1210  NA 135  K 3.8  CL 104  CO2 24  GLUCOSE 113*  BUN 20  CREATININE 0.86  CALCIUM 7.8*   Liver Function Tests: Recent Labs  Lab 09/09/17 1210  AST 19  ALT 20  ALKPHOS 43  BILITOT 1.0  PROT 5.9*  ALBUMIN 3.5   No results for input(s): LIPASE, AMYLASE in the last 168 hours. No results for input(s): AMMONIA in  the last 168 hours. CBC: Recent Labs  Lab 09/09/17 1210  WBC 4.7  NEUTROABS 2.4  HGB 12.9*  HCT 38.4*  MCV 90.8  PLT 152   Cardiac Enzymes: No results for input(s): CKTOTAL, CKMB, CKMBINDEX, TROPONINI in the last 168 hours. BNP: BNP (last 3 results) No results for input(s): BNP in the last 8760 hours.  ProBNP (last 3 results) No results for input(s): PROBNP in the last 8760 hours.  CBG: Recent Labs  Lab 09/09/17 1132  GLUCAP 126*       Signed:  Gavriella Hearst N  [facility] 09/10/2017, 1:00 PM

## 2017-09-11 ENCOUNTER — Other Ambulatory Visit (INDEPENDENT_AMBULATORY_CARE_PROVIDER_SITE_OTHER): Payer: Self-pay | Admitting: Internal Medicine

## 2017-09-11 MED ORDER — SUCRALFATE 1 G PO TABS: 1.0000 g | ORAL_TABLET | Freq: Three times a day (TID) | ORAL | 1 refills | Status: DC

## 2017-09-14 ENCOUNTER — Other Ambulatory Visit (INDEPENDENT_AMBULATORY_CARE_PROVIDER_SITE_OTHER): Payer: Self-pay | Admitting: Internal Medicine

## 2017-09-14 DIAGNOSIS — G8929 Other chronic pain: Secondary | ICD-10-CM

## 2017-09-14 DIAGNOSIS — R0789 Other chest pain: Secondary | ICD-10-CM

## 2017-09-14 DIAGNOSIS — R1013 Epigastric pain: Principal | ICD-10-CM

## 2017-09-14 DIAGNOSIS — R140 Abdominal distension (gaseous): Secondary | ICD-10-CM

## 2017-09-15 ENCOUNTER — Ambulatory Visit (INDEPENDENT_AMBULATORY_CARE_PROVIDER_SITE_OTHER): Payer: Medicare Other | Admitting: Cardiology

## 2017-09-15 ENCOUNTER — Encounter: Payer: Self-pay | Admitting: Cardiology

## 2017-09-15 VITALS — BP 102/60 | HR 70 | Ht 70.0 in | Wt 190.0 lb

## 2017-09-15 DIAGNOSIS — I2510 Atherosclerotic heart disease of native coronary artery without angina pectoris: Secondary | ICD-10-CM

## 2017-09-15 DIAGNOSIS — Z789 Other specified health status: Secondary | ICD-10-CM | POA: Diagnosis not present

## 2017-09-15 DIAGNOSIS — E782 Mixed hyperlipidemia: Secondary | ICD-10-CM

## 2017-09-15 DIAGNOSIS — R55 Syncope and collapse: Secondary | ICD-10-CM | POA: Diagnosis not present

## 2017-09-15 NOTE — Patient Instructions (Signed)
Your physician wants you to follow-up in:  6 months with Dr McDowell You will receive a reminder letter in the mail two months in advance. If you don't receive a letter, please call our office to schedule the follow-up appointment.    Your physician recommends that you continue on your current medications as directed. Please refer to the Current Medication list given to you today.     If you need a refill on your cardiac medications before your next appointment, please call your pharmacy.     No lab work or testing ordered today.      Thank you for choosing [facility] !        

## 2017-09-15 NOTE — Progress Notes (Signed)
Cardiology Office Note  Date: 09/15/2017   ID: Brad Cruz, DOB Nov 17, 1945, MRN [id number]  PCP: Glenda Chroman, MD  Primary Cardiologist: Rozann Lesches, MD   Chief Complaint  Patient presents with  . Coronary Artery Disease    History of Present Illness: Brad Cruz is a 71 y.o. male last seen in May.  He presents today with his wife for a follow-up visit. Record review finds recent hospital observation following an episode of suspected vasovagal syncope that occurred after an endoscopy, I reviewed the discharge summary.  No arrhythmias were documented by telemetry, ECG showed no acute abnormalities, and echocardiogram revealed LVEF 55-60%.  He does not have any history of unexplained syncope at baseline, otherwise not reporting any angina symptoms.  Continues workup for abdominal discomfort and limited appetite.  States that he has a gallbladder ultrasound later this week.  He is following with Dr. Laural Golden.  I reviewed his medications which are outlined below.  He has nitroglycerin available but has not needed to use it.  I reviewed his recent ECG and echocardiogram.  Past Medical History:  Diagnosis Date  . Allergy    Mold, cat dander  . Asthma    Childhood history  . BPH (benign prostatic hyperplasia)   . Cataract   . Coronary atherosclerosis of native coronary artery    a. 06/2012 Myoview:  No ischemia;  b. 06/25/2012 Cath:  LM nl, LAD 50p, 39m, D1 80 ost (small), D2 nl (small), LCX 30 ost, OM1 nl, RI 30 ost, RCA 50-60- ost, 7m, PDA minor irregs, PL nl (small), EF 65%.   Marland Kitchen GERD (gastroesophageal reflux disease)   . Hyperlipidemia   . Hypothyroidism   . Migraines   . 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 Skin cancer, basal cell   . Vasovagal syncope     Past Surgical History:  Procedure Laterality Date  . CARDIAC CATHETERIZATION    . COLONOSCOPY    . Deviated septum repair 1989    . SKIN CANCER EXCISION     Under left eye basal cell    Current  Outpatient Medications  Medication Sig Dispense Refill  . acetaminophen (TYLENOL) 500 MG tablet Take 1,000 mg daily as needed by mouth for moderate pain or headache.     . levothyroxine (SYNTHROID, LEVOTHROID) 88 MCG tablet Take 88 mcg daily before breakfast by mouth.    . nitroGLYCERIN (NITROSTAT) 0.4 MG SL tablet Place 1 tablet (0.4 mg total) under the tongue every 5 (five) minutes as needed for chest pain. 25 tablet 3  . psyllium (METAMUCIL) 58.6 % packet Take 1 packet daily by mouth.    . RABEprazole (ACIPHEX) 20 MG tablet Take 1 tablet (20 mg total) daily before breakfast by mouth.    . Saw Palmetto, Serenoa repens, 320 MG CAPS Take 640 mg at bedtime by mouth.     . sucralfate (CARAFATE) 1 g tablet Take 1 tablet (1 g total) 4 (four) times daily -  with meals and at bedtime by mouth. 60 tablet 1   No current facility-administered medications for this visit.    Allergies:  Oatmeal; Cats claw [uncaria tomentosa (cats claw)]; Citrus; Dairy aid [lactase]; Nsaids; Shellfish-derived products; and Ibuprofen   Social History: The patient  reports that  has never smoked. he has never used smokeless tobacco. He reports that he does not drink alcohol or use drugs.   ROS:  Please see the history of present illness. Otherwise, complete  review of systems is positive for none.  All other systems are reviewed and negative.   Physical Exam: VS:  BP 102/60 (BP Location: Left Arm)   Pulse 70   Ht 5\' 10"  (1.778 m)   Wt 190 lb (86.2 kg)   SpO2 98%   BMI 27.26 kg/m , BMI Body mass index is 27.26 kg/m.  Wt Readings from Last 3 Encounters:  09/15/17 190 lb (86.2 kg)  09/09/17 189 lb (85.7 kg)  09/09/17 186 lb (84.4 kg)    General: Patient appears comfortable at rest. HEENT: Conjunctiva and lids normal, oropharynx clear. Neck: Supple, no elevated JVP or carotid bruits, no thyromegaly. Lungs: Clear to auscultation, nonlabored breathing at rest. Cardiac: Regular rate and rhythm, no S3 or significant  systolic murmur, no pericardial rub. Abdomen: Soft, nontender, no hepatomegaly, bowel sounds present, no guarding or rebound. Extremities: No pitting edema, distal pulses 2+. Skin: Warm and dry. Musculoskeletal: No kyphosis. Neuropsychiatric: Alert and oriented x3, affect grossly appropriate.  ECG: I personally reviewed the tracing from 09/09/2017 which showed normal sinus rhythm.  Recent Labwork: 09/09/2017: ALT 20; AST 19; BUN 20; Creatinine, Ser 0.86; Hemoglobin 12.9; Platelets 152; Potassium 3.8; Sodium 135   Other Studies Reviewed Today:  Echocardiogram 09/10/2017: Study Conclusions  - Left ventricle: The cavity size was normal. Wall thickness was   increased in a pattern of mild LVH. Systolic function was normal.   The estimated ejection fraction was in the range of 55% to 60%.   Wall motion was normal; there were no regional wall motion   abnormalities. Left ventricular diastolic function parameters   were normal. - Mitral valve: There was mild regurgitation. - Right atrium: Central venous pressure (est): 8 mm Hg. - Tricuspid valve: There was trivial regurgitation. - Pulmonary arteries: PA peak pressure: 33 mm Hg (S). - Pericardium, extracardiac: There was no pericardial effusion.  Impressions:  - Mild LVH with LVEF 55-60% and normal diastolic function. Mild   mitral regurgitation. Trivial tricuspid regurgitation with   estimated PASP 33 mmHg.  Assessment and Plan:  1.  Nonobstructive CAD based on previous cardiac catheterization in 2013.  LVEF is normal at 55-60% by recent follow-up echocardiogram.  He does not report any angina symptoms at this time.  We continue with observation.  Medical therapy is limited due to intolerances including statins.  2.  Recent episode of apparent vasovagal syncope based on record review.  He does not otherwise report any history of unexplained syncope.  Hospital observation on telemetry did not reveal any arrhythmias and LVEF was normal  range.  Suggest observation for now.  3.  History of hyperlipidemia with statin intolerance.  4.  Reflux symptoms or abdominal discomfort.  He continues workup with Dr. Laural Golden.  Current medicines were reviewed with the patient today.  Disposition: Follow-up in 6 months.  Signed, Satira Sark, MD, [facility] 09/15/2017 12:00 PM    [facility] [place]. [address], [place], [place] [address] Phone: [phone]; Fax: [phone]

## 2017-09-18 ENCOUNTER — Ambulatory Visit (HOSPITAL_COMMUNITY)
Admission: RE | Admit: 2017-09-18 | Discharge: 2017-09-18 | Disposition: A | Discharge status: Home / Self Care | Payer: Medicare Other | Source: Ambulatory Visit | Attending: Internal Medicine | Admitting: Internal Medicine

## 2017-09-18 DIAGNOSIS — G8929 Other chronic pain: Secondary | ICD-10-CM | POA: Insufficient documentation

## 2017-09-18 DIAGNOSIS — R140 Abdominal distension (gaseous): Secondary | ICD-10-CM | POA: Insufficient documentation

## 2017-09-18 DIAGNOSIS — K7689 Other specified diseases of liver: Secondary | ICD-10-CM | POA: Diagnosis not present

## 2017-09-18 DIAGNOSIS — R0789 Other chest pain: Secondary | ICD-10-CM | POA: Insufficient documentation

## 2017-09-18 DIAGNOSIS — R1013 Epigastric pain: Secondary | ICD-10-CM | POA: Diagnosis not present

## 2017-09-18 DIAGNOSIS — K760 Fatty (change of) liver, not elsewhere classified: Secondary | ICD-10-CM | POA: Insufficient documentation

## 2017-09-21 ENCOUNTER — Other Ambulatory Visit (INDEPENDENT_AMBULATORY_CARE_PROVIDER_SITE_OTHER): Payer: Self-pay | Admitting: Internal Medicine

## 2017-09-21 MED ORDER — PANTOPRAZOLE SODIUM 40 MG PO TBEC: 40.0000 mg | DELAYED_RELEASE_TABLET | Freq: Two times a day (BID) | ORAL | 2 refills | Status: DC

## 2017-09-23 ENCOUNTER — Encounter (HOSPITAL_COMMUNITY): Payer: Self-pay | Admitting: Internal Medicine

## 2017-10-02 ENCOUNTER — Telehealth (INDEPENDENT_AMBULATORY_CARE_PROVIDER_SITE_OTHER): Payer: Self-pay | Admitting: Internal Medicine

## 2017-10-02 NOTE — Telephone Encounter (Signed)
Patient called, stated he was supposed to call Dr. Laural Golden today.  He stated that he is still having a low grade burning in his esophagus, but nothing like it was and a moderate metallic taste.  He stated that the Sucralfate is helping and would like a 90 supply sent to [facility] if Dr. Laural Golden wants him to continue.  He stated that Dr. Laural Golden had mentioned a CT possibly.  [phone]

## 2017-10-04 ENCOUNTER — Other Ambulatory Visit (INDEPENDENT_AMBULATORY_CARE_PROVIDER_SITE_OTHER): Payer: Self-pay | Admitting: Internal Medicine

## 2017-10-04 MED ORDER — SUCRALFATE 1 G PO TABS: 1.0000 g | ORAL_TABLET | Freq: Three times a day (TID) | ORAL | 2 refills | Status: DC

## 2017-10-04 MED ORDER — SUCRALFATE 1 G PO TABS: 1.0000 g | ORAL_TABLET | Freq: Three times a day (TID) | ORAL | 1 refills | Status: DC

## 2017-10-04 NOTE — Telephone Encounter (Signed)
Patient's call returned. He is 50% better.  Therefore we will continue with sucralfate. Prescription sent to patient's mail order pharmacy. He will call with progress report 1 month.

## 2017-10-04 NOTE — Telephone Encounter (Signed)
New prescription sent to mail order pharmacy for 3 months. Patient will call with progress report in 1 month. Chest pain persist we will proceed with chest CT.

## 2017-11-05 ENCOUNTER — Telehealth (INDEPENDENT_AMBULATORY_CARE_PROVIDER_SITE_OTHER): Payer: Self-pay | Admitting: Internal Medicine

## 2017-11-05 NOTE — Telephone Encounter (Signed)
Patient called to give a progress report and to see what steps Dr. Laural Golden would like to take next.  He stated that he was to take to mediation Dr. Laural Golden prescribed for a month, which he did, however, he stated the fatigue was unbearable and he had headaches.  Due to this he cut his medication in half and his fatigue and headache suppressed a little.  He stated that Dr. Laural Golden had mentioned getting a MRI of his gallbladder.    [phone]

## 2017-11-05 NOTE — Telephone Encounter (Signed)
To be discussed with Dr.Rehman.

## 2017-11-06 NOTE — Telephone Encounter (Signed)
Forwarded to Dr.Rehman. 

## 2017-11-09 ENCOUNTER — Other Ambulatory Visit (INDEPENDENT_AMBULATORY_CARE_PROVIDER_SITE_OTHER): Payer: Self-pay | Admitting: Internal Medicine

## 2017-11-09 MED ORDER — FAMOTIDINE 20 MG PO TABS: 20.0000 mg | ORAL_TABLET | Freq: Two times a day (BID) | ORAL | 1 refills | Status: DC

## 2017-11-09 NOTE — Telephone Encounter (Signed)
Call returned. Patient advised to stop pantoprazole. We will start famotidine 20 mg p.o. twice daily.  Prescription sent to mail order pharmacy. Schedule upper abdominal ultrasound at [facility] in near future.  Patient can go any day except next week Thursday.

## 2017-11-10 NOTE — Telephone Encounter (Signed)
[place] sch'd 11/17/17 at 8 am (730) @ [facility], npo after midnight, patient aware

## 2017-11-17 DIAGNOSIS — K219 Gastro-esophageal reflux disease without esophagitis: Secondary | ICD-10-CM | POA: Diagnosis not present

## 2017-11-17 DIAGNOSIS — K7689 Other specified diseases of liver: Secondary | ICD-10-CM | POA: Diagnosis not present

## 2017-11-18 DIAGNOSIS — M47816 Spondylosis without myelopathy or radiculopathy, lumbar region: Secondary | ICD-10-CM | POA: Insufficient documentation

## 2017-11-19 ENCOUNTER — Encounter: Payer: Self-pay | Admitting: Family Medicine

## 2017-11-19 ENCOUNTER — Other Ambulatory Visit: Payer: Self-pay

## 2017-11-19 ENCOUNTER — Ambulatory Visit (INDEPENDENT_AMBULATORY_CARE_PROVIDER_SITE_OTHER): Payer: Medicare Other | Admitting: Family Medicine

## 2017-11-19 VITALS — BP 114/78 | HR 72 | Temp 98.3°F | Resp 16 | Ht 70.0 in | Wt 186.1 lb

## 2017-11-19 DIAGNOSIS — E559 Vitamin D deficiency, unspecified: Secondary | ICD-10-CM | POA: Diagnosis not present

## 2017-11-19 DIAGNOSIS — Z789 Other specified health status: Secondary | ICD-10-CM

## 2017-11-19 DIAGNOSIS — N401 Enlarged prostate with lower urinary tract symptoms: Secondary | ICD-10-CM

## 2017-11-19 DIAGNOSIS — Z85828 Personal history of other malignant neoplasm of skin: Secondary | ICD-10-CM | POA: Insufficient documentation

## 2017-11-19 DIAGNOSIS — I2510 Atherosclerotic heart disease of native coronary artery without angina pectoris: Secondary | ICD-10-CM | POA: Diagnosis not present

## 2017-11-19 DIAGNOSIS — Z23 Encounter for immunization: Secondary | ICD-10-CM | POA: Diagnosis not present

## 2017-11-19 DIAGNOSIS — E039 Hypothyroidism, unspecified: Secondary | ICD-10-CM

## 2017-11-19 DIAGNOSIS — M47816 Spondylosis without myelopathy or radiculopathy, lumbar region: Secondary | ICD-10-CM

## 2017-11-19 DIAGNOSIS — N138 Other obstructive and reflux uropathy: Secondary | ICD-10-CM | POA: Diagnosis not present

## 2017-11-19 NOTE — Patient Instructions (Signed)
Need old records  Try to incorporate more walking into the daily routine  Come back for a PE when due Need prevnar today I recommend shingles shots

## 2017-11-19 NOTE — Progress Notes (Signed)
Chief Complaint  Patient presents with  . Hyperlipidemia   This is a new patient to establish. He has a history of coronary artery disease.  He is under the care of Dr. Johnny Bridge in cardiology.  He has nitro, but states he is never used them.  He is free of coronary symptoms, chest pain, shortness of breath, pedal edema.  He is statin intolerant.  He does not get regular exercise.  He is reminded that this would improve his health, and his future health. He has a history of hypothyroidism.  He is on Synthroid replacement  Is a history of BPH.  Was tried on oral medication which did not help.  He now uses saw palmetto.  He believes he has had prostate cancer screening.  He has minimal urinary frequency and no nocturia at this time. He has GERD and is under the care of Dr. Laural Golden in gastroenterology.  He had a recent endoscopy.  Because of the anesthesia he had syncope.  He spent one night in the hospital for observation.  His records are reviewed. He is up-to-date with health maintenance.  Colonoscopy.  Immunizations.  I will request his old records.  He believes his had a tetanus in the last 10 years He has had a flu shot this year, tube pneumonia shots.  He had a shingles shot..   Patient Active Problem List   Diagnosis Date Noted  . Hypothyroid 11/19/2017  . BPH with obstruction/lower urinary tract symptoms 11/19/2017  . History of basal cell cancer 11/19/2017  . Statin intolerance 11/19/2017  . 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 Gastroesophageal reflux disease without esophagitis 09/08/2017  . CAD (coronary artery disease), native coronary artery 07/26/2012  . Hyperlipidemia     Outpatient Encounter Medications as of 11/19/2017  Medication Sig  . acetaminophen (TYLENOL) 500 MG tablet Take 1,000 mg daily as needed by mouth for moderate pain or headache.   . famotidine (PEPCID) 20 MG tablet Take 1 tablet (20 mg total) by mouth 2 (two) times daily.  Marland Kitchen  levothyroxine (SYNTHROID, LEVOTHROID) 88 MCG tablet Take 88 mcg daily before breakfast by mouth.  . Saw Palmetto, Serenoa repens, 320 MG CAPS Take 640 mg at bedtime by mouth.   . sucralfate (CARAFATE) 1 g tablet Take 1 tablet (1 g total) by mouth 4 (four) times daily -  with meals and at bedtime.  . nitroGLYCERIN (NITROSTAT) 0.4 MG SL tablet Place 1 tablet (0.4 mg total) under the tongue every 5 (five) minutes as needed for chest pain. (Patient not taking: Reported on 11/19/2017)   No facility-administered encounter medications on file as of 11/19/2017.     Past Medical History:  Diagnosis Date  . Allergy    Mold, cat dander  . Arthritis    cerv spine  . Asthma    Childhood history  . BPH (benign prostatic hyperplasia)   . Cataract   . Coronary atherosclerosis of native coronary artery    a. 06/2012 Myoview:  No ischemia;  b. 06/25/2012 Cath:  LM nl, LAD 50p, 61m, D1 80 ost (small), D2 nl (small), LCX 30 ost, OM1 nl, RI 30 ost, RCA 50-60- ost, 89m, PDA minor irregs, PL nl (small), EF 65%.   Marland Kitchen GERD (gastroesophageal reflux disease)   . Hyperlipidemia   . Hypothyroidism   . Migraines   . 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 Skin cancer, basal cell    BCC face  .  Vasovagal syncope     Past Surgical History:  Procedure Laterality Date  . BIOPSY  09/09/2017   Procedure: BIOPSY;  Surgeon: Rogene Houston, MD;  Location: [facility];  Service: Endoscopy;;  gastric  . CARDIAC CATHETERIZATION    . COLONOSCOPY    . Deviated septum repair 1989    . ESOPHAGOGASTRODUODENOSCOPY N/A 09/09/2017   Procedure: ESOPHAGOGASTRODUODENOSCOPY (EGD);  Surgeon: Rogene Houston, MD;  Location: [facility];  Service: Endoscopy;  Laterality: N/A;  . LEFT HEART CATHETERIZATION WITH CORONARY ANGIOGRAM N/A 06/25/2012   Procedure: LEFT HEART CATHETERIZATION WITH CORONARY ANGIOGRAM;  Surgeon: Minus Breeding, MD;  Location: [facility] C[facility] LAB;  Service: Cardiovascular;  Laterality: N/A;  . LUMBAR  LAMINECTOMY/DECOMPRESSION MICRODISCECTOMY  11/23/2012   Procedure: LUMBAR LAMINECTOMY/DECOMPRESSION MICRODISCECTOMY 1 LEVEL;  Surgeon: Floyce Stakes, MD;  Location: [facility] NEURO ORS;  Service: Neurosurgery;  Laterality: Left;  Left Lumbar five-sacral one Diskectomy  . SKIN CANCER EXCISION     Under left eye basal cell  . SPINE SURGERY      Social History   Socioeconomic History  . Marital status: Married    Spouse name: bettie  . Number of children: 1  . Years of education: 24  . Highest education level: Professional school degree (e.g., MD, DDS, DVM, JD)  Social Needs  . Financial resource strain: Not hard at all  . Food insecurity - worry: Never true  . Food insecurity - inability: Never true  . Transportation needs - medical: No  . Transportation needs - non-medical: No  Occupational History  . Occupation: Therapist, art x 5 years    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  . Occupation: attorney  retired  Tobacco Use  . Smoking status: Never Smoker  . Smokeless tobacco: Never Used  Substance and Sexual Activity  . Alcohol use: No    Alcohol/week: 0.0 oz  . Drug use: No  . Sexual activity: Yes    Partners: Female    Birth control/protection: Post-menopausal  Other Topics Concern  . Not on file  Social History Narrative   Lives with Ronney Lion   Married 35 years   Involved with church and the [place]     Family History  Problem Relation Age of Onset  . Heart disease Mother        age 51  . Transient ischemic attack Mother   . Stroke Mother   . Stomach cancer Sister   . Cancer Sister        age 46  . Colon polyps Brother   . Heart disease Brother   . Alzheimer's disease Father        50  . Colon cancer Neg Hx     Review of Systems  Constitutional: Negative for chills, fever and weight loss.  HENT: Negative for congestion and hearing loss.   Eyes: Negative for blurred vision and pain.  Respiratory: Negative for cough and shortness of breath.   Cardiovascular: Negative for chest  pain and leg swelling.  Gastrointestinal: Negative for abdominal pain, constipation, diarrhea and heartburn.  Genitourinary: Negative for dysuria and frequency.  Musculoskeletal: Negative for falls, joint pain and myalgias.  Neurological: Negative for dizziness, seizures and headaches.  Psychiatric/Behavioral: Negative for depression. The patient is not nervous/anxious and does not have insomnia.     BP 114/78 (BP Location: Left Arm, Patient Position: Sitting, Cuff Size: Normal)   Pulse 72   Temp 98.3 F (36.8 C) (Temporal)   Resp 16   Ht 5\' 10"  (1.778 m)  Wt 186 lb 1.3 oz (84.4 kg)   SpO2 98%   BMI 26.70 kg/m   Physical Exam  Constitutional: He is oriented to person, place, and time. He appears well-developed and well-nourished.  HENT:  Head: Normocephalic and atraumatic.  Right Ear: External ear normal.  Left Ear: External ear normal.  Mouth/Throat: Oropharynx is clear and moist.  Eyes: Conjunctivae are normal. Pupils are equal, round, and reactive to light.  Neck: Normal range of motion. Neck supple. No thyromegaly present.  Cardiovascular: Normal rate, regular rhythm and normal heart sounds.  Pulmonary/Chest: Effort normal and breath sounds normal. No respiratory distress.  Abdominal: Soft. Bowel sounds are normal.  Musculoskeletal: Normal range of motion. He exhibits no edema.  Lymphadenopathy:    He has no cervical adenopathy.  Neurological: He is alert and oriented to person, place, and time.  Gait normal  Skin: Skin is warm and dry.  Psychiatric: He has a normal mood and affect. His behavior is normal. Thought content normal.  Nursing note and vitals reviewed. ASSESSMENT/PLAN:   1. Osteoarthritis of lumbar spine, unspecified spinal osteoarthritis complication status Status post surgery  2. Hypothyroidism, unspecified type Corrected - TSH  3. Coronary artery disease involving native coronary artery of native heart without angina pectoris Under care  cardiology - CBC - COMPLETE METABOLIC PANEL WITH GFR - Lipid panel - Urinalysis, Routine w reflex microscopic  4. Vitamin D deficiency Needs a follow-up - VITAMIN D 25 Hydroxy (Vit-D Deficiency, Fractures)  5. Need for pneumococcal vaccination Done today - Pneumococcal conjugate vaccine 13-valent IM  6. BPH with obstruction/lower urinary tract symptoms Discussed BPH symptoms.  Treatment.  Warning signs.  PSA.   Patient Instructions  Need old records  Try to incorporate more walking into the daily routine  Come back for a PE when due Need prevnar today I recommend shingles shots     Raylene Everts, MD

## 2017-11-26 ENCOUNTER — Telehealth (INDEPENDENT_AMBULATORY_CARE_PROVIDER_SITE_OTHER): Payer: Self-pay | Admitting: Internal Medicine

## 2017-11-26 NOTE — Telephone Encounter (Signed)
Patient called with results of ultrasound. No evidence of cholelithiasis or dilated bile duct. Hepatic cyst appears to be benign. Postprandial discomfort.  He is on healthy low-fat diet. He was having headache with PPI and he was switched to famotidine.  He is experiencing headache with famotidine as well. He will try cimetidine 200 mg p.o. twice daily. Will proceed with a HIDA scan with [facility]. She would prefer to come to any pain hospital for this test. We will try to schedule this test next week.

## 2017-11-27 ENCOUNTER — Other Ambulatory Visit (INDEPENDENT_AMBULATORY_CARE_PROVIDER_SITE_OTHER): Payer: Self-pay | Admitting: Internal Medicine

## 2017-11-27 DIAGNOSIS — R1013 Epigastric pain: Secondary | ICD-10-CM

## 2017-11-27 NOTE — Telephone Encounter (Signed)
HIDA scan sch'd 12/02/17 at 8 am (745), npo after midnight, no pain med after midnight, patient aware

## 2017-12-02 ENCOUNTER — Other Ambulatory Visit (HOSPITAL_COMMUNITY): Payer: Medicare Other

## 2017-12-03 ENCOUNTER — Encounter (HOSPITAL_COMMUNITY)
Admission: RE | Admit: 2017-12-03 | Discharge: 2017-12-03 | Disposition: A | Discharge status: Home / Self Care | Payer: Medicare Other | Source: Ambulatory Visit | Attending: Internal Medicine | Admitting: Internal Medicine

## 2017-12-03 ENCOUNTER — Encounter (HOSPITAL_COMMUNITY): Payer: Self-pay

## 2017-12-03 DIAGNOSIS — R1013 Epigastric pain: Secondary | ICD-10-CM | POA: Diagnosis not present

## 2017-12-03 MED ORDER — TECHNETIUM TC 99M MEBROFENIN IV KIT
5.0000 | PACK | Freq: Once | INTRAVENOUS | Status: AC | PRN
  Administered 2017-12-03: 5 via INTRAVENOUS

## 2017-12-21 ENCOUNTER — Encounter (INDEPENDENT_AMBULATORY_CARE_PROVIDER_SITE_OTHER): Payer: Self-pay | Admitting: Internal Medicine

## 2017-12-21 ENCOUNTER — Ambulatory Visit (INDEPENDENT_AMBULATORY_CARE_PROVIDER_SITE_OTHER): Payer: Medicare Other | Admitting: Internal Medicine

## 2017-12-21 ENCOUNTER — Telehealth: Payer: Self-pay | Admitting: Family Medicine

## 2017-12-21 VITALS — BP 108/70 | HR 65 | Temp 97.9°F | Resp 18 | Ht 70.0 in | Wt 185.9 lb

## 2017-12-21 DIAGNOSIS — I2510 Atherosclerotic heart disease of native coronary artery without angina pectoris: Secondary | ICD-10-CM

## 2017-12-21 DIAGNOSIS — K219 Gastro-esophageal reflux disease without esophagitis: Secondary | ICD-10-CM

## 2017-12-21 MED ORDER — CIMETIDINE 400 MG PO TABS: 400.0000 mg | ORAL_TABLET | Freq: Two times a day (BID) | ORAL | 3 refills | Status: DC

## 2017-12-21 MED ORDER — LEVOTHYROXINE SODIUM 88 MCG PO TABS: 88.0000 ug | ORAL_TABLET | Freq: Every day | ORAL | 0 refills | Status: DC

## 2017-12-21 NOTE — Telephone Encounter (Signed)
Patient came into the office to request a refill for L-Thyroxine Tab 88MCG Generic for : Synthroid tabs   [facility]  He is requesting a 90 day supply  Cb#: [phone]

## 2017-12-21 NOTE — Progress Notes (Signed)
Presenting complaint;  Follow-up for GERD.  Database and subjective:  Patient is 72 year old Caucasian male who is here for scheduled visit. He was initially seen on 09/08/2017 for symptoms of GERD unresponsive to PPI or H2 B.  He had tried Nexium AcipHex as well as ranitidine.  Following this office visit he was also given Mower.  He did not get an relief and instead developed headache. He underwent EGD on 09/09/2017.  He was noted to have small sliding hiatal hernia without evidence of reflux esophagitis.  He had patchy antral erythema.  Gastric biopsy revealed normal mucosa and H. pylori stains were negative.  He was tried on pantoprazole and famotidine but did not see any benefit.  Carafate similarly was not helpful.  Ultrasound on 09/18/2017 was negative for cholelithiasis.  Bile duct measured 6 mm. Ultrasound was repeated on 11/17/2017 and once again there was no evidence of cholelithiasis and bile duct measured 6.6 mm.  His LFTs remained normal.  He also had a HIDA scan on 12/03/2017 and EF was over 80%.  His symptoms were not reproduced. Since patient experience side effects with multiple PPIs as well as the ranitidine and famotidine he was advised to try OTC Tagamet and keep symptom diary until office visit. He has Diarrhea for about 3 weeks.  He has recorded pain score as well as his diet and symptoms.  He has noted burning in his chest as well as better throat he also has noted frequent belching and bloating.  He seemed to get worse burning in his retrosternal and epigastric region after lunch or evening meals.  He has scored his pain anywhere from 1.2-8.  He seemed to have more discomfort after supper.  He is on Tagamet OTC 20 mg p.o. twice daily. States he has symptoms virtually every day. He says his symptoms started about 9 months ago after he ate too much at a meal. He denies chronic cough but he has noticed some hoarseness.  He denies melena or rectal bleeding.  Bowels move daily.  He  does not do regular exercise but he stays busy.  Denies dysphagia. He has lost 6 pounds since his last visit over 3 months ago.  He is not trying to lose weight. He has been evaluated by Dr. Johnny Cruz of cardiology and his symptoms felt to be noncardiac.   Current Medications: Outpatient Encounter Medications as of 12/21/2017  Medication Sig  . acetaminophen (TYLENOL) 500 MG tablet Take 1,000 mg daily as needed by mouth for moderate pain or headache.   . cimetidine (TAGAMET) 200 MG tablet Take 200 mg by mouth 2 (two) times daily.  Marland Kitchen levothyroxine (SYNTHROID, LEVOTHROID) 88 MCG tablet Take 88 mcg daily before breakfast by mouth.  . nitroGLYCERIN (NITROSTAT) 0.4 MG SL tablet Place 1 tablet (0.4 mg total) under the tongue every 5 (five) minutes as needed for chest pain.  Marland Kitchen OVER THE COUNTER MEDICATION Psyllium Seed Husk - generic to Metamuncil.  . Saw Palmetto, Serenoa repens, 320 MG CAPS Take 640 mg at bedtime by mouth.   . famotidine (PEPCID) 20 MG tablet Take 1 tablet (20 mg total) by mouth 2 (two) times daily. (Patient not taking: Reported on 12/21/2017)  . sucralfate (CARAFATE) 1 g tablet Take 1 tablet (1 g total) by mouth 4 (four) times daily -  with meals and at bedtime. (Patient not taking: Reported on 12/21/2017)   No facility-administered encounter medications on file as of 12/21/2017.      Objective: Blood pressure 108/70,  pulse 65, temperature 97.9 F (36.6 C), temperature source Oral, resp. rate 18, height 5\' 10"  (1.778 m), weight 185 lb 14.4 oz (84.3 kg). Patient is alert and in no acute distress. Conjunctiva is pink. Sclera is nonicteric Oropharyngeal mucosa is normal. No neck masses or thyromegaly noted. Cardiac exam with regular rhythm normal S1 and S2. No murmur or gallop noted. Lungs are clear to auscultation. Abdomen;  No LE edema or clubbing noted.  Labs/studies Results:  Results of ultrasound and HIDA scan as above.  Assessment:  #1.  Since symptoms of  retrosternal epigastric burning as well as throat symptoms appear to be typical of gastroesophageal reflux disease.  However he has not responded to therapy.  EGD was negative for erosive reflux esophagitis 3 months ago.  He could also have hyperalgesia or dyspepsia.  Have biliary tract disease but he does not have cholelithiasis or dilated bile duct.  If he does not respond to any intervention he may need pH study off acid suppressants.   Plan:  Medication list updated.  Sucralfate removed from the list. Cimetidine 400 mg p.o. twice daily. Gaviscon chewable 2 tablets at bedtime. Patient will keep symptom diary as he has been doing for the next 2-3 weeks and call with progress report. If he does not respond to higher dose of cimetidine will consider short course with metoclopramide prior to considering pH study. Office visit in 3 months.

## 2017-12-21 NOTE — Telephone Encounter (Signed)
Seen 1 17 19

## 2017-12-21 NOTE — Patient Instructions (Addendum)
Gaviscon chew 2 tablets daily at bedtime. Please call office with progress report in 2-3 weeks.

## 2017-12-23 ENCOUNTER — Encounter: Payer: Self-pay | Admitting: Family Medicine

## 2017-12-23 DIAGNOSIS — C4491 Basal cell carcinoma of skin, unspecified: Secondary | ICD-10-CM | POA: Insufficient documentation

## 2018-02-10 DIAGNOSIS — E559 Vitamin D deficiency, unspecified: Secondary | ICD-10-CM | POA: Diagnosis not present

## 2018-02-10 DIAGNOSIS — E039 Hypothyroidism, unspecified: Secondary | ICD-10-CM | POA: Diagnosis not present

## 2018-02-10 DIAGNOSIS — I2510 Atherosclerotic heart disease of native coronary artery without angina pectoris: Secondary | ICD-10-CM | POA: Diagnosis not present

## 2018-02-11 LAB — URINALYSIS, ROUTINE W REFLEX MICROSCOPIC
Bilirubin Urine: NEGATIVE
Glucose, UA: NEGATIVE
HGB URINE DIPSTICK: NEGATIVE
Ketones, ur: NEGATIVE
LEUKOCYTES UA: NEGATIVE
Nitrite: NEGATIVE
PH: 6.5 (ref 5.0–8.0)
PROTEIN: NEGATIVE
Specific Gravity, Urine: 1.02 (ref 1.001–1.03)

## 2018-02-11 LAB — TSH: TSH: 2.73 mIU/L (ref 0.40–4.50)

## 2018-02-11 LAB — COMPLETE METABOLIC PANEL WITH GFR
AG Ratio: 2.3 (calc) (ref 1.0–2.5)
ALBUMIN MSPROF: 4.6 g/dL (ref 3.6–5.1)
ALKALINE PHOSPHATASE (APISO): 51 U/L (ref 40–115)
ALT: 19 U/L (ref 9–46)
AST: 20 U/L (ref 10–35)
BUN / CREAT RATIO: 14 (calc) (ref 6–22)
BUN: 18 mg/dL (ref 7–25)
CO2: 31 mmol/L (ref 20–32)
Calcium: 9.1 mg/dL (ref 8.6–10.3)
Chloride: 102 mmol/L (ref 98–110)
Creat: 1.27 mg/dL — ABNORMAL HIGH (ref 0.70–1.18)
GFR, EST AFRICAN AMERICAN: 65 mL/min/{1.73_m2} (ref >=60)
GFR, Est Non African American: 56 mL/min/{1.73_m2} — ABNORMAL LOW (ref >=60)
GLUCOSE: 99 mg/dL (ref 65–99)
Globulin: 2 g/dL (calc) (ref 1.9–3.7)
Potassium: 4.3 mmol/L (ref 3.5–5.3)
Sodium: 138 mmol/L (ref 135–146)
TOTAL PROTEIN: 6.6 g/dL (ref 6.1–8.1)
Total Bilirubin: 1 mg/dL (ref 0.2–1.2)

## 2018-02-11 LAB — LIPID PANEL
CHOL/HDL RATIO: 6 (calc) — AB (ref <5.0)
CHOLESTEROL: 204 mg/dL — AB (ref <200)
HDL: 34 mg/dL — ABNORMAL LOW (ref >40)
LDL Cholesterol (Calc): 142 mg/dL (calc) — ABNORMAL HIGH
Non-HDL Cholesterol (Calc): 170 mg/dL (calc) — ABNORMAL HIGH (ref <130)
TRIGLYCERIDES: 151 mg/dL — AB (ref <150)

## 2018-02-11 LAB — CBC
HCT: 42.5 % (ref 38.5–50.0)
HEMOGLOBIN: 14.7 g/dL (ref 13.2–17.1)
MCH: 30.2 pg (ref 27.0–33.0)
MCHC: 34.6 g/dL (ref 32.0–36.0)
MCV: 87.3 fL (ref 80.0–100.0)
MPV: 10 fL (ref 7.5–12.5)
Platelets: 225 10*3/uL (ref 140–400)
RBC: 4.87 10*6/uL (ref 4.20–5.80)
RDW: 12.3 % (ref 11.0–15.0)
WBC: 5.6 10*3/uL (ref 3.8–10.8)

## 2018-02-11 LAB — VITAMIN D 25 HYDROXY (VIT D DEFICIENCY, FRACTURES): Vit D, 25-Hydroxy: 24 ng/mL — ABNORMAL LOW (ref 30–100)

## 2018-02-17 ENCOUNTER — Ambulatory Visit (INDEPENDENT_AMBULATORY_CARE_PROVIDER_SITE_OTHER): Payer: Medicare Other | Admitting: Family Medicine

## 2018-02-17 ENCOUNTER — Other Ambulatory Visit: Payer: Self-pay

## 2018-02-17 ENCOUNTER — Encounter: Payer: Self-pay | Admitting: Family Medicine

## 2018-02-17 VITALS — BP 130/60 | HR 76 | Temp 97.7°F | Resp 16 | Ht 70.0 in | Wt 183.1 lb

## 2018-02-17 DIAGNOSIS — Z789 Other specified health status: Secondary | ICD-10-CM

## 2018-02-17 DIAGNOSIS — N401 Enlarged prostate with lower urinary tract symptoms: Secondary | ICD-10-CM | POA: Diagnosis not present

## 2018-02-17 DIAGNOSIS — Z0000 Encounter for general adult medical examination without abnormal findings: Secondary | ICD-10-CM

## 2018-02-17 DIAGNOSIS — E559 Vitamin D deficiency, unspecified: Secondary | ICD-10-CM

## 2018-02-17 DIAGNOSIS — N138 Other obstructive and reflux uropathy: Secondary | ICD-10-CM

## 2018-02-17 DIAGNOSIS — E039 Hypothyroidism, unspecified: Secondary | ICD-10-CM | POA: Diagnosis not present

## 2018-02-17 MED ORDER — LEVOTHYROXINE SODIUM 88 MCG PO TABS: 88.0000 ug | ORAL_TABLET | Freq: Every day | ORAL | 3 refills | Status: DC

## 2018-02-17 NOTE — Progress Notes (Signed)
Chief Complaint  Patient presents with  . Annual Exam   Patient is here for his annual examination. He is a retired Forensic psychologist. He eats well and he exercises. He gets eye exams yearly He sees his dentist 2-3 times a year He is compliant with his medication He had a colonoscopy in 2015.  It was benign and is not due until 2025. He had recent lab work.  The results are discussed with him.  TSH is therapeutic.  He has hyperlipidemia.  He was intolerant of multiple statins. He has a history of vitamin D deficiency.  We discussed vitamin D deficiency, and replacement.  Diet.  Sunshine. Is a history of BPH.  He takes saw palmetto.  He states he has been tried on both Flomax and Proscar.  They both caused "dribbling".  He had a PSA last year.  It was negative.  We discussed indications for PSA testing.  He chooses not to have it again this year. He sees cardiology on a regular basis.  He is asymptomatic.  Patient Active Problem List   Diagnosis Date Noted  . BCC (basal cell carcinoma) 12/23/2017  . Hypothyroid 11/19/2017  . BPH with obstruction/lower urinary tract symptoms 11/19/2017  . History of basal cell cancer 11/19/2017  . Statin intolerance 11/19/2017  . 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 Gastroesophageal reflux disease without esophagitis 09/08/2017  . CAD (coronary artery disease), native coronary artery 07/26/2012  . Hyperlipidemia     Outpatient Encounter Medications as of 02/17/2018  Medication Sig  . acetaminophen (TYLENOL) 500 MG tablet Take 1,000 mg daily as needed by mouth for moderate pain or headache.   . Alum Hydroxide-Mag Carbonate (GAVISCON EXTRA STRENGTH PO) Take 160 mg by mouth 2 (two) times daily.  . Cholecalciferol (EQL VITAMIN D3 GUMMIES PO) Take 1 Dose by mouth daily.  . cimetidine (TAGAMET) 400 MG tablet Take 1 tablet (400 mg total) by mouth 2 (two) times daily.  . fexofenadine (ALLEGRA) 180 MG tablet Take 180 mg by mouth daily.    Marland Kitchen levothyroxine (SYNTHROID, LEVOTHROID) 88 MCG tablet Take 1 tablet (88 mcg total) by mouth daily before breakfast.  . nitroGLYCERIN (NITROSTAT) 0.4 MG SL tablet Place 1 tablet (0.4 mg total) under the tongue every 5 (five) minutes as needed for chest pain.  Marland Kitchen OVER THE COUNTER MEDICATION Psyllium Seed Husk - generic to Metamuncil.  . Psyllium (METAMUCIL FIBER PO) Take 5 mLs by mouth daily. Mix with water  . Saw Palmetto, Serenoa repens, 320 MG CAPS Take 640 mg at bedtime by mouth.    No facility-administered encounter medications on file as of 02/17/2018.     Allergies  Allergen Reactions  . Citrus Hives    Avoid grapefruit, OJ  . Ibuprofen Hives, Swelling and Rash  . Shellfish-Derived Products Hives    Rash   . Statins Other (See Comments)    Review of Systems  Constitutional: Negative for activity change, appetite change, fatigue and unexpected weight change.  HENT: Negative for congestion and dental problem.   Eyes: Negative for photophobia, redness and visual disturbance.  Respiratory: Negative for cough and shortness of breath.   Cardiovascular: Negative for chest pain and palpitations.  Gastrointestinal: Negative for blood in stool, constipation and diarrhea.  Genitourinary: Positive for frequency. Negative for difficulty urinating.  Musculoskeletal: Negative for arthralgias and back pain.  Neurological: Negative for dizziness and headaches.  Psychiatric/Behavioral: Negative for decreased concentration. The patient is not nervous/anxious.  Physical Exam   BP 130/60   Pulse 76   Temp 97.7 F (36.5 C) (Oral)   Resp 16   Ht 5\' 10"  (1.778 m)   Wt 183 lb 1.9 oz (83.1 kg)   SpO2 96%   BMI 26.27 kg/m   General Appearance:    Alert, cooperative, no distress, appears stated age  Head:    Normocephalic, without obvious abnormality, atraumatic  Eyes:    PERRL, conjunctiva/corneas clear, EOM's intact, fundi    benign, both eyes       Ears:    Normal TM's and external  ear canals, both ears  Nose:   Nares normal, septum midline, mucosa normal, no drainage   or sinus tenderness  Throat:   Lips, mucosa, and tongue normal; teeth and gums normal  Neck:   Supple, symmetrical, trachea midline, no adenopathy;       thyroid:  No enlargement/tenderness/nodules; no carotid   bruit or JVD  Back:     Symmetric, no curvature, ROM normal, no CVA tenderness  Lungs:     Clear to auscultation bilaterally, respirations unlabored  Chest wall:    No tenderness or deformity  Heart:    Regular rate and rhythm, S1 and S2 normal, no murmur, rub   or gallop  Abdomen:     Soft, non-tender, bowel sounds active all four quadrants,    no masses, no organomegaly  Genitalia:    Normal male without lesion, discharge or tenderness  Extremities:   Extremities normal, atraumatic, no cyanosis or edema  Pulses:   2+ and symmetric all extremities  Skin:   Skin color, texture, turgor normal, no rashes or lesions  Lymph nodes:   Cervical, supraclavicular, and axillary nodes normal  Neurologic:   CNII-XII intact. Normal strength, sensation and reflexes      throughout     BP 130/60   Pulse 76   Temp 97.7 F (36.5 C) (Oral)   Resp 16   Ht 5\' 10"  (1.778 m)   Wt 183 lb 1.9 oz (83.1 kg)   SpO2 96%   BMI 26.27 kg/m     ASSESSMENT/PLAN:  1. Annual physical exam No abnormal findings  2. Hypothyroidism, unspecified type Replaced, with therapeutic TSH  3. Vitamin D deficiency By history, started on replacement  4. BPH with obstruction/lower urinary tract symptoms By history, on saw palmetto.  Acceptable results.  Does not wish to try prescription medication.  He has a urologist to see if needed  5. Statin intolerance Multiple statins.  He will discuss with his cardiologist.   Patient Instructions  See [place] yearly You are doing well Call sooner for problems   Raylene Everts, MD

## 2018-02-17 NOTE — Patient Instructions (Signed)
See [place] yearly You are doing well Call sooner for problems

## 2018-03-30 ENCOUNTER — Ambulatory Visit (INDEPENDENT_AMBULATORY_CARE_PROVIDER_SITE_OTHER): Payer: Medicare Other | Admitting: Internal Medicine

## 2018-04-01 ENCOUNTER — Ambulatory Visit (INDEPENDENT_AMBULATORY_CARE_PROVIDER_SITE_OTHER): Payer: Medicare Other | Admitting: Internal Medicine

## 2018-04-07 ENCOUNTER — Encounter (INDEPENDENT_AMBULATORY_CARE_PROVIDER_SITE_OTHER): Payer: Self-pay | Admitting: Internal Medicine

## 2018-04-07 ENCOUNTER — Ambulatory Visit (INDEPENDENT_AMBULATORY_CARE_PROVIDER_SITE_OTHER): Payer: Medicare Other | Admitting: Internal Medicine

## 2018-04-07 VITALS — BP 130/70 | HR 72 | Temp 97.3°F | Ht 69.5 in | Wt 181.5 lb

## 2018-04-07 DIAGNOSIS — I2510 Atherosclerotic heart disease of native coronary artery without angina pectoris: Secondary | ICD-10-CM

## 2018-04-07 DIAGNOSIS — R1013 Epigastric pain: Secondary | ICD-10-CM

## 2018-04-07 NOTE — Patient Instructions (Signed)
OV in 1 year. GERD diet.

## 2018-04-07 NOTE — Progress Notes (Signed)
Subjective:    Patient ID: Brad Cruz, male    DOB: Jul 24, 1946, 72 y.o.   MRN: [id number]  HPI Here today for f/u Underwent an EGD in November of 2018. Normal except for gastritis.     [place] 11/17/2017 for epigastric pain/GERD: No evidence of gallstones or other acute findings. CBD 6.27mm. Stable small rt hepatic lobe cyst.  HIDA scan 12/03/2017 normal. Last seen 12/21/2017. Has tried Nexium and Aciphex and well as Zantac which did not help.  Also tried Carafate which did not help.  LFTs are normal. Has been evaluated by Dr. Myles Gip and his symptoms felt to be non-cardiac. He tells me this morning, he has lost 10 pounds and all of his symptoms resolved. He says stress provokes his epigastric pain.  He has been walking but not everyday. He has not changed his diet but does eat less.  He feels about 60% better.   Review of Systems Past Medical History:  Diagnosis Date  . Allergy    Mold, cat dander  . Arthritis    cerv spine  . Asthma    Childhood history  . BPH (benign prostatic hyperplasia)   . Cataract   . Coronary atherosclerosis of native coronary artery    a. 06/2012 Myoview:  No ischemia;  b. 06/25/2012 Cath:  LM nl, LAD 50p, 40m, D1 80 ost (small), D2 nl (small), LCX 30 ost, OM1 nl, RI 30 ost, RCA 50-60- ost, 94m, PDA minor irregs, PL nl (small), EF 65%.   Marland Kitchen GERD (gastroesophageal reflux disease)   . Hyperlipidemia   . Hypothyroidism   . Migraines   . 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 Skin cancer, basal cell    BCC face  . Vasovagal syncope     Past Surgical History:  Procedure Laterality Date  . BIOPSY  09/09/2017   Procedure: BIOPSY;  Surgeon: Rogene Houston, MD;  [facility];  Service: Endoscopy;;  gastric  . CARDIAC CATHETERIZATION    . COLONOSCOPY    . Deviated septum repair 1989    . ESOPHAGOGASTRODUODENOSCOPY N/A 09/09/2017   Procedure: ESOPHAGOGASTRODUODENOSCOPY (EGD);  Surgeon: Rogene Houston, MD;  [facility];   Service: Endoscopy;  Laterality: N/A;  . LEFT HEART CATHETERIZATION WITH CORONARY ANGIOGRAM N/A 06/25/2012   Procedure: LEFT HEART CATHETERIZATION WITH CORONARY ANGIOGRAM;  Surgeon: Minus Breeding, MD;  Location: [facility] CATH LAB;  Service: Cardiovascular;  Laterality: N/A;  . LUMBAR LAMINECTOMY/DECOMPRESSION MICRODISCECTOMY  11/23/2012   Procedure: LUMBAR LAMINECTOMY/DECOMPRESSION MICRODISCECTOMY 1 LEVEL;  Surgeon: Floyce Stakes, MD;  Location: [facility];  Service: Neurosurgery;  Laterality: Left;  Left Lumbar five-sacral one Diskectomy  . SKIN CANCER EXCISION     Under left eye basal cell  . SPINE SURGERY      Allergies  Allergen Reactions  . Citrus Hives    Avoid grapefruit, OJ  . Ibuprofen Hives, Swelling and Rash  . Shellfish-Derived Products Hives    Rash   . Statins Other (See Comments)    Current Outpatient Medications on File Prior to Visit  Medication Sig Dispense Refill  . acetaminophen (TYLENOL) 500 MG tablet Take 1,000 mg daily as needed by mouth for moderate pain or headache.     . Alum Hydroxide-Mag Carbonate (GAVISCON EXTRA STRENGTH PO) Take 160 mg by mouth 2 (two) times daily.    . Cholecalciferol (EQL VITAMIN D3 GUMMIES PO) Take 1 Dose by mouth daily.    . cimetidine (TAGAMET)  400 MG tablet Take 1 tablet (400 mg total) by mouth 2 (two) times daily. 180 tablet 3  . fexofenadine (ALLEGRA) 180 MG tablet Take 180 mg by mouth daily.    Marland Kitchen levothyroxine (SYNTHROID, LEVOTHROID) 88 MCG tablet Take 1 tablet (88 mcg total) by mouth daily before breakfast. 90 tablet 3  . nitroGLYCERIN (NITROSTAT) 0.4 MG SL tablet Place 1 tablet (0.4 mg total) under the tongue every 5 (five) minutes as needed for chest pain. 25 tablet 3  . OVER THE COUNTER MEDICATION Psyllium Seed Husk - generic to Metamuncil.    . Psyllium (METAMUCIL FIBER PO) Take 5 mLs by mouth daily. Mix with water    . Saw Palmetto, Serenoa repens, 320 MG CAPS Take 640 mg at bedtime by mouth.      No current  facility-administered medications on file prior to visit.         Objective:   Physical Exam Blood pressure 130/70, pulse 72, temperature (!) 97.3 F (36.3 C), height 5' 9.5" (1.765 m), weight 181 lb 8 oz (82.3 kg). Alert and oriented. Skin warm and dry. Oral mucosa is moist.   . Sclera anicteric, conjunctivae is pink. Thyroid not enlarged. No cervical lymphadenopathy. Lungs clear. Heart regular rate and rhythm.  Abdomen is soft. Bowel sounds are positive. No hepatomegaly. No abdominal masses felt. No tenderness.  No edema to lower extremities.          Assessment & Plan:  Epigastric pain for the most part has resolved since starting his diet and exercising. He feels better now.  GERD diet given to patient. Will hold off on the Reglan as his symptoms have improved.

## 2018-04-08 ENCOUNTER — Encounter: Payer: Self-pay | Admitting: Family Medicine

## 2018-04-09 ENCOUNTER — Encounter: Payer: Self-pay | Admitting: Family Medicine

## 2018-07-14 DIAGNOSIS — E785 Hyperlipidemia, unspecified: Secondary | ICD-10-CM | POA: Diagnosis not present

## 2018-07-14 DIAGNOSIS — R5383 Other fatigue: Secondary | ICD-10-CM | POA: Diagnosis not present

## 2018-07-14 DIAGNOSIS — E039 Hypothyroidism, unspecified: Secondary | ICD-10-CM | POA: Diagnosis not present

## 2018-07-14 DIAGNOSIS — R7309 Other abnormal glucose: Secondary | ICD-10-CM | POA: Diagnosis not present

## 2018-07-14 DIAGNOSIS — Z125 Encounter for screening for malignant neoplasm of prostate: Secondary | ICD-10-CM | POA: Diagnosis not present

## 2018-07-14 DIAGNOSIS — R6882 Decreased libido: Secondary | ICD-10-CM | POA: Diagnosis not present

## 2018-07-14 DIAGNOSIS — E559 Vitamin D deficiency, unspecified: Secondary | ICD-10-CM | POA: Diagnosis not present

## 2018-07-14 DIAGNOSIS — Z131 Encounter for screening for diabetes mellitus: Secondary | ICD-10-CM | POA: Diagnosis not present

## 2018-08-12 DIAGNOSIS — D1801 Hemangioma of skin and subcutaneous tissue: Secondary | ICD-10-CM | POA: Diagnosis not present

## 2018-08-12 DIAGNOSIS — L111 Transient acantholytic dermatosis [Grover]: Secondary | ICD-10-CM | POA: Diagnosis not present

## 2018-08-12 DIAGNOSIS — K130 Diseases of lips: Secondary | ICD-10-CM | POA: Diagnosis not present

## 2018-08-12 DIAGNOSIS — Z85828 Personal history of other malignant neoplasm of skin: Secondary | ICD-10-CM | POA: Diagnosis not present

## 2018-08-25 DIAGNOSIS — E039 Hypothyroidism, unspecified: Secondary | ICD-10-CM | POA: Diagnosis not present

## 2018-08-25 DIAGNOSIS — R5383 Other fatigue: Secondary | ICD-10-CM | POA: Diagnosis not present

## 2018-08-25 DIAGNOSIS — E785 Hyperlipidemia, unspecified: Secondary | ICD-10-CM | POA: Diagnosis not present

## 2018-08-25 DIAGNOSIS — Z23 Encounter for immunization: Secondary | ICD-10-CM | POA: Diagnosis not present

## 2018-08-25 DIAGNOSIS — R6882 Decreased libido: Secondary | ICD-10-CM | POA: Diagnosis not present

## 2018-08-26 DIAGNOSIS — R6882 Decreased libido: Secondary | ICD-10-CM | POA: Diagnosis not present

## 2018-09-23 ENCOUNTER — Encounter: Payer: Self-pay | Admitting: Cardiology

## 2018-09-23 ENCOUNTER — Ambulatory Visit (INDEPENDENT_AMBULATORY_CARE_PROVIDER_SITE_OTHER): Payer: Medicare Other | Admitting: Cardiology

## 2018-09-23 VITALS — BP 122/76 | HR 74 | Ht 69.0 in | Wt 181.0 lb

## 2018-09-23 DIAGNOSIS — I2510 Atherosclerotic heart disease of native coronary artery without angina pectoris: Secondary | ICD-10-CM

## 2018-09-23 DIAGNOSIS — R55 Syncope and collapse: Secondary | ICD-10-CM | POA: Diagnosis not present

## 2018-09-23 NOTE — Progress Notes (Signed)
Cardiology Office Note  Date: 09/23/2018   ID: Brad Cruz, DOB Sep 02, 1946, MRN [id number]  PCP: Doree Albee, MD  Primary Cardiologist: Rozann Lesches, MD   Chief Complaint  Patient presents with  . Cardiac follow-up    History of Present Illness: Brad Cruz is a 72 y.o. male last seen in November 2018.  He presents for a routine follow-up visit.  He has now established with Dr. [name] for primary care.  I reviewed recent lab work which is outlined below.  He is now participating in a more regular exercise program, watching his diet carefully, also on hormone replacement.  He does not report any exertional chest pain or unusual shortness of breath.  He has had no palpitations or syncope.  I personally reviewed his ECG today which shows normal sinus rhythm.  Still has intermittent reflux symptoms.  He is on Tagamet regularly.  Past Medical History:  Diagnosis Date  . Allergy    Mold, cat dander  . Arthritis    cerv spine  . Asthma    Childhood history  . BPH (benign prostatic hyperplasia)   . Cataract   . Coronary atherosclerosis of native coronary artery    a. 06/2012 Myoview:  No ischemia;  b. 06/25/2012 Cath:  LM nl, LAD 50p, 79m, D1 80 ost (small), D2 nl (small), LCX 30 ost, OM1 nl, RI 30 ost, RCA 50-60- ost, 2m, PDA minor irregs, PL nl (small), EF 65%.   Marland Kitchen GERD (gastroesophageal reflux disease)   . Hyperlipidemia   . Hypothyroidism   . Migraines   . 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 Skin cancer, basal cell    BCC face  . Vasovagal syncope     Past Surgical History:  Procedure Laterality Date  . BIOPSY  09/09/2017   Procedure: BIOPSY;  Surgeon: Rogene Houston, MD;  [facility];  Service: Endoscopy;;  gastric  . CARDIAC CATHETERIZATION    . COLONOSCOPY    . Deviated septum repair 1989    . ESOPHAGOGASTRODUODENOSCOPY N/A 09/09/2017   Procedure: ESOPHAGOGASTRODUODENOSCOPY (EGD);  Surgeon: Rogene Houston, MD;  [facility];  Service: Endoscopy;  Laterality: N/A;  . LEFT HEART CATHETERIZATION WITH CORONARY ANGIOGRAM N/A 06/25/2012   Procedure: LEFT HEART CATHETERIZATION WITH CORONARY ANGIOGRAM;  Surgeon: Minus Breeding, MD;  Location: [facility] CATH LAB;  Service: Cardiovascular;  Laterality: N/A;  . LUMBAR LAMINECTOMY/DECOMPRESSION MICRODISCECTOMY  11/23/2012   Procedure: LUMBAR LAMINECTOMY/DECOMPRESSION MICRODISCECTOMY 1 LEVEL;  Surgeon: Floyce Stakes, MD;  Location: [facility];  Service: Neurosurgery;  Laterality: Left;  Left Lumbar five-sacral one Diskectomy  . SKIN CANCER EXCISION     Under left eye basal cell  . SPINE SURGERY      Current Outpatient Medications  Medication Sig Dispense Refill  . acetaminophen (TYLENOL) 500 MG tablet Take 1,000 mg daily as needed by mouth for moderate pain or headache.     . Alum Hydroxide-Mag Carbonate (GAVISCON EXTRA STRENGTH PO) Take 160 mg by mouth 2 (two) times daily.    . Cholecalciferol (VITAMIN D3 MAXIMUM STRENGTH) 125 MCG (5000 UT) capsule Take 5,000 Units by mouth daily.    . cimetidine (TAGAMET) 400 MG tablet Take 1 tablet (400 mg total) by mouth 2 (two) times daily. 180 tablet 3  . fexofenadine (ALLEGRA) 180 MG tablet Take 180 mg by mouth daily.    . nitroGLYCERIN (NITROSTAT) 0.4 MG SL tablet Place 1 tablet (0.4 mg total)  under the tongue every 5 (five) minutes as needed for chest pain. 25 tablet 3  . NP THYROID 60 MG tablet Take 60 mg by mouth daily before breakfast.     . OVER THE COUNTER MEDICATION Psyllium Seed Husk - generic to Metamuncil.    . Psyllium (METAMUCIL FIBER PO) Take 5 mLs by mouth daily. Mix with water    . Saw Palmetto, Serenoa repens, 320 MG CAPS Take 640 mg at bedtime by mouth.     . testosterone cypionate (DEPOTESTOSTERONE CYPIONATE) 200 MG/ML injection Inject 100 mg into the muscle once a week.     No current facility-administered medications for this visit.    Allergies:  Citrus; Ibuprofen; Shellfish-derived products; and Statins    Social History: The patient  reports that he has never smoked. He has never used smokeless tobacco. He reports that he does not drink alcohol or use drugs.   ROS:  Please see the history of present illness. Otherwise, complete review of systems is positive for none.  All other systems are reviewed and negative.   Physical Exam: VS:  BP 122/76   Pulse 74   Ht 5\' 9"  (1.753 m)   Wt 181 lb (82.1 kg)   SpO2 97%   BMI 26.73 kg/m , BMI Body mass index is 26.73 kg/m.  Wt Readings from Last 3 Encounters:  09/23/18 181 lb (82.1 kg)  04/07/18 181 lb 8 oz (82.3 kg)  02/17/18 183 lb 1.9 oz (83.1 kg)    General: Patient appears comfortable at rest. HEENT: Conjunctiva and lids normal, oropharynx clear. Neck: Supple, no elevated JVP or carotid bruits, no thyromegaly. Lungs: Clear to auscultation, nonlabored breathing at rest. Cardiac: Regular rate and rhythm, no S3 or significant systolic murmur. Abdomen: Soft, nontender, bowel sounds present. Extremities: No pitting edema, distal pulses 2+. Skin: Warm and dry. Musculoskeletal: No kyphosis. Neuropsychiatric: Alert and oriented x3, affect grossly appropriate.  ECG: I personally reviewed the tracing from 09/09/2017 which showed sinus rhythm.  Recent Labwork: 02/10/2018: ALT 19; AST 20; BUN 18; Creat 1.27; Hemoglobin 14.7; Platelets 225; Potassium 4.3; Sodium 138; TSH 2.73     Component Value Date/Time   CHOL 204 (H) 02/10/2018 1057   TRIG 151 (H) 02/10/2018 1057   HDL 34 (L) 02/10/2018 1057   CHOLHDL 6.0 (H) 02/10/2018 1057   LDLCALC 142 (H) 02/10/2018 1057  September 2019: Hemoglobin A1c 5.6, cholesterol 228, HDL 41, triglycerides 128, LDL 162, BUN 24, creatinine 0.97, potassium 4.3, AST 22, ALT 20, total testosterone 399, free testosterone 32, TSH 0.97, hemoglobin 14.7, platelets 236.  Other Studies Reviewed Today:  Echocardiogram 09/10/2017: Study Conclusions  - Left ventricle: The cavity size was normal. Wall thickness  was increased in a pattern of mild LVH. Systolic function was normal. The estimated ejection fraction was in the range of 55% to 60%. Wall motion was normal; there were no regional wall motion abnormalities. Left ventricular diastolic function parameters were normal. - Mitral valve: There was mild regurgitation. - Right atrium: Central venous pressure (est): 8 mm Hg. - Tricuspid valve: There was trivial regurgitation. - Pulmonary arteries: PA peak pressure: 33 mm Hg (S). - Pericardium, extracardiac: There was no pericardial effusion.  Impressions:  - Mild LVH with LVEF 55-60% and normal diastolic function. Mild mitral regurgitation. Trivial tricuspid regurgitation with estimated PASP 33 mmHg.  Assessment and Plan:  1.  History of nonobstructive CAD, no active angina symptoms at this time.  He is now following with Dr. [name], modifying diet with  exercise plan on hormone replacement.  I reviewed his recent lab work.  I recommended that he continue with an exercise plan and be observant for any symptom changes.  His ECG is normal today.  He has a history of statin intolerance.  Baby aspirin daily would be reasonable as well.  2.  History of vasovagal syncope, no recurrences.  Current medicines were reviewed with the patient today.   Orders Placed This Encounter  Procedures  . EKG 12-Lead    Disposition: Follow-up in 1 year.  Signed, Satira Sark, MD, [facility] 09/23/2018 3:32 PM    [facility] [address] [place], [place] [address] Phone: [phone]; Fax: [phone]

## 2018-09-23 NOTE — Patient Instructions (Signed)
Medication Instructions:  Your physician recommends that you continue on your current medications as directed. Please refer to the Current Medication list given to you today.  If you need a refill on your cardiac medications before your next appointment, please call your pharmacy.   Lab work: None If you have labs (blood work) drawn today and your tests are completely normal, you will receive your results only by: Marland Kitchen MyChart Message (if you have MyChart) OR . A paper copy in the mail If you have any lab test that is abnormal or we need to change your treatment, we will call you to review the results.  Testing/Procedures: None  Follow-Up: At [facility], you and your health needs are our priority.  As part of our continuing mission to provide you with exceptional heart care, we have created designated Provider Care Teams.  These Care Teams include your primary Cardiologist (physician) and Advanced Practice Providers (APPs -  Physician Assistants and Nurse Practitioners) who all work together to provide you with the care you need, when you need it. You will need a follow up appointment in 1 years.  Please call our office 2 months in advance to schedule this appointment.  You may see Rozann Lesches, MD or one of the following Advanced Practice Providers on your designated Care Team:   Bernerd Pho, PA-C [facility]) . Ermalinda Barrios, PA-C ([place])  Any Other Special Instructions Will Be Listed Below (If Applicable). None

## 2018-11-02 DIAGNOSIS — R5383 Other fatigue: Secondary | ICD-10-CM | POA: Diagnosis not present

## 2018-11-02 DIAGNOSIS — R6882 Decreased libido: Secondary | ICD-10-CM | POA: Diagnosis not present

## 2018-11-02 DIAGNOSIS — E039 Hypothyroidism, unspecified: Secondary | ICD-10-CM | POA: Diagnosis not present

## 2018-11-09 DIAGNOSIS — M5432 Sciatica, left side: Secondary | ICD-10-CM | POA: Diagnosis not present

## 2018-11-16 ENCOUNTER — Other Ambulatory Visit: Payer: Self-pay | Admitting: Internal Medicine

## 2018-11-16 ENCOUNTER — Other Ambulatory Visit (HOSPITAL_COMMUNITY): Payer: Self-pay | Admitting: Internal Medicine

## 2018-11-16 DIAGNOSIS — M5432 Sciatica, left side: Secondary | ICD-10-CM

## 2018-11-24 ENCOUNTER — Ambulatory Visit (HOSPITAL_COMMUNITY)
Admission: RE | Admit: 2018-11-24 | Discharge: 2018-11-24 | Disposition: A | Discharge status: Home / Self Care | Payer: Medicare Other | Source: Ambulatory Visit | Attending: Internal Medicine | Admitting: Internal Medicine

## 2018-11-24 DIAGNOSIS — M545 Low back pain: Secondary | ICD-10-CM | POA: Diagnosis not present

## 2018-11-24 DIAGNOSIS — M5432 Sciatica, left side: Secondary | ICD-10-CM | POA: Insufficient documentation

## 2018-12-10 DIAGNOSIS — M546 Pain in thoracic spine: Secondary | ICD-10-CM | POA: Diagnosis not present

## 2018-12-10 DIAGNOSIS — M48062 Spinal stenosis, lumbar region with neurogenic claudication: Secondary | ICD-10-CM | POA: Diagnosis not present

## 2018-12-10 DIAGNOSIS — Z6826 Body mass index (BMI) 26.0-26.9, adult: Secondary | ICD-10-CM | POA: Diagnosis not present

## 2018-12-10 DIAGNOSIS — M5136 Other intervertebral disc degeneration, lumbar region: Secondary | ICD-10-CM | POA: Diagnosis not present

## 2018-12-10 DIAGNOSIS — M549 Dorsalgia, unspecified: Secondary | ICD-10-CM | POA: Diagnosis not present

## 2018-12-10 DIAGNOSIS — M47816 Spondylosis without myelopathy or radiculopathy, lumbar region: Secondary | ICD-10-CM | POA: Diagnosis not present

## 2018-12-10 DIAGNOSIS — M5126 Other intervertebral disc displacement, lumbar region: Secondary | ICD-10-CM | POA: Diagnosis not present

## 2018-12-10 DIAGNOSIS — M5416 Radiculopathy, lumbar region: Secondary | ICD-10-CM | POA: Diagnosis not present

## 2018-12-10 DIAGNOSIS — R030 Elevated blood-pressure reading, without diagnosis of hypertension: Secondary | ICD-10-CM | POA: Diagnosis not present

## 2018-12-16 DIAGNOSIS — M5136 Other intervertebral disc degeneration, lumbar region: Secondary | ICD-10-CM | POA: Diagnosis not present

## 2018-12-16 DIAGNOSIS — M48061 Spinal stenosis, lumbar region without neurogenic claudication: Secondary | ICD-10-CM | POA: Diagnosis not present

## 2018-12-16 DIAGNOSIS — M4726 Other spondylosis with radiculopathy, lumbar region: Secondary | ICD-10-CM | POA: Diagnosis not present

## 2019-01-04 DIAGNOSIS — R6882 Decreased libido: Secondary | ICD-10-CM | POA: Diagnosis not present

## 2019-01-04 DIAGNOSIS — E039 Hypothyroidism, unspecified: Secondary | ICD-10-CM | POA: Diagnosis not present

## 2019-01-04 DIAGNOSIS — E785 Hyperlipidemia, unspecified: Secondary | ICD-10-CM | POA: Diagnosis not present

## 2019-02-15 DIAGNOSIS — E559 Vitamin D deficiency, unspecified: Secondary | ICD-10-CM | POA: Diagnosis not present

## 2019-02-15 DIAGNOSIS — Z1159 Encounter for screening for other viral diseases: Secondary | ICD-10-CM | POA: Diagnosis not present

## 2019-02-15 DIAGNOSIS — E039 Hypothyroidism, unspecified: Secondary | ICD-10-CM | POA: Diagnosis not present

## 2019-02-15 DIAGNOSIS — Z7189 Other specified counseling: Secondary | ICD-10-CM | POA: Diagnosis not present

## 2019-02-15 DIAGNOSIS — R5383 Other fatigue: Secondary | ICD-10-CM | POA: Diagnosis not present

## 2019-02-15 DIAGNOSIS — Z113 Encounter for screening for infections with a predominantly sexual mode of transmission: Secondary | ICD-10-CM | POA: Diagnosis not present

## 2019-04-11 ENCOUNTER — Ambulatory Visit (INDEPENDENT_AMBULATORY_CARE_PROVIDER_SITE_OTHER): Payer: Medicare Other | Admitting: Internal Medicine

## 2019-04-11 ENCOUNTER — Other Ambulatory Visit: Payer: Self-pay

## 2019-04-11 ENCOUNTER — Encounter (INDEPENDENT_AMBULATORY_CARE_PROVIDER_SITE_OTHER): Payer: Self-pay | Admitting: Internal Medicine

## 2019-04-11 VITALS — BP 129/75 | HR 82 | Temp 98.3°F | Ht 70.0 in | Wt 181.6 lb

## 2019-04-11 DIAGNOSIS — K219 Gastro-esophageal reflux disease without esophagitis: Secondary | ICD-10-CM

## 2019-04-11 NOTE — Patient Instructions (Signed)
OV in 1 year.  

## 2019-04-11 NOTE — Progress Notes (Signed)
Subjective:    Patient ID: Brad Cruz, male    DOB: 1945/12/20, 73 y.o.   MRN: [id number]  HPI Here today for f/u. Hx of chronic GERD.  Takes Tagment OTC once a day.  He underwent EGD on 09/09/2017.  He was noted to have small sliding hiatal hernia without evidence of reflux esophagitis.  He had patchy antral erythema.  Gastric biopsy revealed normal mucosa and H. pylori stains were negative.  His appetite is good. He has lost about 3.5 pounds.  BMs are normal.   Review of Systems Past Medical History:  Diagnosis Date  . Allergy    Mold, cat dander  . Arthritis    cerv spine  . Asthma    Childhood history  . BPH (benign prostatic hyperplasia)   . Cataract   . Coronary atherosclerosis of native coronary artery    a. 06/2012 Myoview:  No ischemia;  b. 06/25/2012 Cath:  LM nl, LAD 50p, 4m, D1 80 ost (small), D2 nl (small), LCX 30 ost, OM1 nl, RI 30 ost, RCA 50-60- ost, 69m, PDA minor irregs, PL nl (small), EF 65%.   Marland Kitchen GERD (gastroesophageal reflux disease)   . Hyperlipidemia   . Hypothyroidism   . Migraines   . 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 Skin cancer, basal cell    BCC face  . Vasovagal syncope     Past Surgical History:  Procedure Laterality Date  . BIOPSY  09/09/2017   Procedure: BIOPSY;  Surgeon: Rogene Houston, MD;  [facility];  Service: Endoscopy;;  gastric  . CARDIAC CATHETERIZATION    . COLONOSCOPY    . Deviated septum repair 1989    . ESOPHAGOGASTRODUODENOSCOPY N/A 09/09/2017   Procedure: ESOPHAGOGASTRODUODENOSCOPY (EGD);  Surgeon: Rogene Houston, MD;  [facility];  Service: Endoscopy;  Laterality: N/A;  . LEFT HEART CATHETERIZATION WITH CORONARY ANGIOGRAM N/A 06/25/2012   Procedure: LEFT HEART CATHETERIZATION WITH CORONARY ANGIOGRAM;  Surgeon: Minus Breeding, MD;  [facility] CATH LAB;  Service: Cardiovascular;  Laterality: N/A;  . LUMBAR LAMINECTOMY/DECOMPRESSION MICRODISCECTOMY  11/23/2012   Procedure: LUMBAR  LAMINECTOMY/DECOMPRESSION MICRODISCECTOMY 1 LEVEL;  Surgeon: Floyce Stakes, MD;  Location: [facility];  Service: Neurosurgery;  Laterality: Left;  Left Lumbar five-sacral one Diskectomy  . SKIN CANCER EXCISION     Under left eye basal cell  . SPINE SURGERY      Allergies  Allergen Reactions  . Citrus Hives    Avoid grapefruit, OJ  . Ibuprofen Hives, Swelling and Rash  . Shellfish-Derived Products Hives    Rash   . Statins Other (See Comments)    Current Outpatient Medications on File Prior to Visit  Medication Sig Dispense Refill  . acetaminophen (TYLENOL) 500 MG tablet Take 1,000 mg daily as needed by mouth for moderate pain or headache.     . Alum Hydroxide-Mag Carbonate (GAVISCON EXTRA STRENGTH PO) Take 160 mg by mouth 2 (two) times daily.    . Cholecalciferol (VITAMIN D3 MAXIMUM STRENGTH) 125 MCG (5000 UT) capsule Take 5,000 Units by mouth daily.    . cimetidine (TAGAMET) 400 MG tablet Take 1 tablet (400 mg total) by mouth 2 (two) times daily. (Patient taking differently: Take 400 mg by mouth as needed. ) 180 tablet 3  . fexofenadine (ALLEGRA) 180 MG tablet Take 180 mg by mouth daily.    . NP THYROID 60 MG tablet Take 90 mg by mouth daily before breakfast.     .  OVER THE COUNTER MEDICATION Psyllium Seed Husk - generic to Metamuncil.    . Psyllium (METAMUCIL FIBER PO) Take 5 mLs by mouth daily. Mix with water    . Saw Palmetto, Serenoa repens, 320 MG CAPS Take 640 mg at bedtime by mouth.     . testosterone cypionate (DEPOTESTOSTERONE CYPIONATE) 200 MG/ML injection Inject into the muscle once a week. 0.2ml weekly     No current facility-administered medications on file prior to visit.         Objective:   Physical Exam Blood pressure 129/75, pulse 82, temperature 98.3 F (36.8 C), height 5\' 10"  (1.778 m), weight 181 lb 9.6 oz (82.4 kg). Alert and oriented. Skin warm and dry. Oral mucosa is moist.   . Sclera anicteric, conjunctivae is pink. Thyroid not enlarged. No  cervical lymphadenopathy. Lungs clear. Heart regular rate and rhythm.  Abdomen is soft. Bowel sounds are positive. No hepatomegaly. No abdominal masses felt. No tenderness.  No edema to lower extremities.         Assessment & Plan:  GERD. He is doing pretty well.  He will continue the Tagment. OV in 1 year.

## 2019-04-19 DIAGNOSIS — Z6826 Body mass index (BMI) 26.0-26.9, adult: Secondary | ICD-10-CM | POA: Diagnosis not present

## 2019-04-19 DIAGNOSIS — M5136 Other intervertebral disc degeneration, lumbar region: Secondary | ICD-10-CM | POA: Diagnosis not present

## 2019-04-19 DIAGNOSIS — R030 Elevated blood-pressure reading, without diagnosis of hypertension: Secondary | ICD-10-CM | POA: Diagnosis not present

## 2019-04-19 DIAGNOSIS — M48062 Spinal stenosis, lumbar region with neurogenic claudication: Secondary | ICD-10-CM | POA: Diagnosis not present

## 2019-04-19 DIAGNOSIS — M47816 Spondylosis without myelopathy or radiculopathy, lumbar region: Secondary | ICD-10-CM | POA: Diagnosis not present

## 2019-04-19 DIAGNOSIS — M5416 Radiculopathy, lumbar region: Secondary | ICD-10-CM | POA: Diagnosis not present

## 2019-05-10 DIAGNOSIS — E559 Vitamin D deficiency, unspecified: Secondary | ICD-10-CM | POA: Diagnosis not present

## 2019-05-10 DIAGNOSIS — E039 Hypothyroidism, unspecified: Secondary | ICD-10-CM | POA: Diagnosis not present

## 2019-05-10 DIAGNOSIS — E785 Hyperlipidemia, unspecified: Secondary | ICD-10-CM | POA: Diagnosis not present

## 2019-05-10 DIAGNOSIS — R6882 Decreased libido: Secondary | ICD-10-CM | POA: Diagnosis not present

## 2019-06-09 DIAGNOSIS — M48061 Spinal stenosis, lumbar region without neurogenic claudication: Secondary | ICD-10-CM | POA: Diagnosis not present

## 2019-06-09 DIAGNOSIS — M4726 Other spondylosis with radiculopathy, lumbar region: Secondary | ICD-10-CM | POA: Diagnosis not present

## 2019-06-09 DIAGNOSIS — M5136 Other intervertebral disc degeneration, lumbar region: Secondary | ICD-10-CM | POA: Diagnosis not present

## 2019-08-09 ENCOUNTER — Other Ambulatory Visit: Payer: Self-pay

## 2019-08-09 ENCOUNTER — Encounter (INDEPENDENT_AMBULATORY_CARE_PROVIDER_SITE_OTHER): Payer: Self-pay | Admitting: Internal Medicine

## 2019-08-09 ENCOUNTER — Ambulatory Visit (INDEPENDENT_AMBULATORY_CARE_PROVIDER_SITE_OTHER): Payer: Medicare Other | Admitting: Internal Medicine

## 2019-08-09 VITALS — BP 140/70 | HR 72 | Ht 69.0 in | Wt 173.6 lb

## 2019-08-09 DIAGNOSIS — E039 Hypothyroidism, unspecified: Secondary | ICD-10-CM

## 2019-08-09 DIAGNOSIS — R5381 Other malaise: Secondary | ICD-10-CM

## 2019-08-09 DIAGNOSIS — R6882 Decreased libido: Secondary | ICD-10-CM

## 2019-08-09 DIAGNOSIS — Z125 Encounter for screening for malignant neoplasm of prostate: Secondary | ICD-10-CM

## 2019-08-09 DIAGNOSIS — R3912 Poor urinary stream: Secondary | ICD-10-CM | POA: Diagnosis not present

## 2019-08-09 DIAGNOSIS — E559 Vitamin D deficiency, unspecified: Secondary | ICD-10-CM

## 2019-08-09 DIAGNOSIS — K219 Gastro-esophageal reflux disease without esophagitis: Secondary | ICD-10-CM

## 2019-08-09 DIAGNOSIS — M47816 Spondylosis without myelopathy or radiculopathy, lumbar region: Secondary | ICD-10-CM

## 2019-08-09 DIAGNOSIS — R5383 Other fatigue: Secondary | ICD-10-CM

## 2019-08-09 DIAGNOSIS — E782 Mixed hyperlipidemia: Secondary | ICD-10-CM

## 2019-08-09 HISTORY — DX: Vitamin D deficiency, unspecified: E55.9

## 2019-08-09 MED ORDER — DULOXETINE HCL 30 MG PO CPEP: 30.0000 mg | ORAL_CAPSULE | Freq: Every day | ORAL | 3 refills | Status: DC

## 2019-08-09 NOTE — Progress Notes (Signed)
Wellness Office Visit  Subjective:  Patient ID: Brad Cruz, male    DOB: [birth year]  Age: 73 y.o. MRN: [id number]  CC: This man comes in for follow-up of his multiple medical problems including low back pain, hypothyroidism, hyperlipidemia, vitamin D deficiency, symptoms of BPH for which he takes over-the-counter saw palmetto, spinal stenosis. HPI  His main concern today appeared to be the low back pain which seems to radiate bilaterally into the anterior thighs and groin area.  He has been seen by spinal surgeon, Dr. Sherwood Gambler in June of this year and he apparently had back injections.  He says that the first injection did seem to help but then the second 1 has not.  He appears to be in significant pain.  He is able to walk but it is making him feel somewhat lower in mood. Testosterone therapy seems to have helped him have more energy overall but now the back pain seems to be taking over all of his symptoms. He continues on desiccated thyroid for his hypothyroidism without any problems. He also continues to take vitamin D3 supplementation for his vitamin D deficiency. He denies any chest pain, dyspnea, palpitations or limb weakness. Past Medical History:  Diagnosis Date  . Allergy    Mold, cat dander  . Arthritis    cerv spine  . Asthma    Childhood history  . BPH (benign prostatic hyperplasia)   . Cataract   . Coronary atherosclerosis of native coronary artery    a. 06/2012 Myoview:  No ischemia;  b. 06/25/2012 Cath:  LM nl, LAD 50p, 49m, D1 80 ost (small), D2 nl (small), LCX 30 ost, OM1 nl, RI 30 ost, RCA 50-60- ost, 55m, PDA minor irregs, PL nl (small), EF 65%.   Marland Kitchen GERD (gastroesophageal reflux disease)   . Hyperlipidemia   . Hypothyroidism   . Migraines   . 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 Skin cancer, basal cell    BCC face  . Vasovagal syncope   . Vitamin D deficiency disease 08/09/2019      Family History  Problem Relation Age of Onset  . Heart  disease Mother        age 39  . Transient ischemic attack Mother   . Stroke Mother   . Stomach cancer Sister   . Cancer Sister        age 62  . Colon polyps Brother   . Heart disease Brother   . Thyroid cancer Brother   . Alzheimer's disease Father        6  . Colon cancer Neg Hx     Social History   Social History Narrative   Lives with Ronney Lion   Married 35 years   Involved with church and the [employer]      Current Meds  Medication Sig  . Alum Hydroxide-Mag Carbonate (GAVISCON EXTRA STRENGTH PO) Take 160 mg by mouth 2 (two) times daily.  . Cholecalciferol (VITAMIN D3 MAXIMUM STRENGTH) 125 MCG (5000 UT) capsule Take 5,000 Units by mouth daily.  . cimetidine (TAGAMET) 400 MG tablet Take 1 tablet (400 mg total) by mouth 2 (two) times daily. (Patient taking differently: Take 400 mg by mouth as needed. )  . fexofenadine (ALLEGRA) 180 MG tablet Take 180 mg by mouth daily.  . NP THYROID 90 MG tablet Take 90 mg by mouth daily.  . Psyllium (METAMUCIL FIBER PO) Take 5 mLs by mouth daily. Mix with water  . Saw  Palmetto, Serenoa repens, 320 MG CAPS Take 640 mg at bedtime by mouth.   . testosterone cypionate (DEPOTESTOSTERONE CYPIONATE) 200 MG/ML injection Inject into the muscle once a week. 0.56ml weekly  . [DISCONTINUED] OVER THE COUNTER MEDICATION Psyllium Seed Husk - generic to Metamuncil.      Bio Identical Hormones  Testosterone therapy is being used off label for symptoms of testosterone deficiency and benefits that it produces based on several studies.  These benefits include decreasing body fat, increasing in lean muscle mass and increasing in bone density.  There is improvement of memory, cognition.  There is improvement in exercise tolerance and endurance.  Testosterone therapy has also been shown to be protective against coronary artery disease, cerebrovascular disease, diabetes, hypertension and degenerative joint disease. I have discussed with the patient the FDA warnings  regarding testosterone therapy, benefits and side effects and modes of administration as well as monitoring blood levels and side effects  on a regular basis The patient is agreeable that testosterone therapy should be an integral part of his/her wellness,quality of life and prevention of chronic disease.  Objective:   Today's Vitals: BP 140/70   Pulse 72   Ht 5\' 9"  (1.753 m)   Wt 173 lb 9.6 oz (78.7 kg)   BMI 25.64 kg/m  Vitals with BMI 08/09/2019 04/11/2019 09/23/2018  Height 5\' 9"  5\' 10"  5\' 9"   Weight 173 lbs 10 oz 181 lbs 10 oz 181 lbs  BMI 25.62 123XX123 [id number]  Systolic XX123456 [id number]  Diastolic 70 75 76  Pulse 72 82 74     Physical Exam    He looks systemically well but he appears to be somewhat uncomfortable walking.  He is alert and orientated.   Assessment   1. Gastroesophageal reflux disease without esophagitis   2. Acquired hypothyroidism   3. Osteoarthritis of lumbar spine, unspecified spinal osteoarthritis complication status   4. Mixed hyperlipidemia   5. Vitamin D deficiency disease   6. Malaise and fatigue   7. Decreased libido   8. Special screening for malignant neoplasm of prostate   9. Poor urinary stream        Tests ordered Orders Placed This Encounter  Procedures  . COMPLETE METABOLIC PANEL WITH GFR  . CBC  . PSA  . T3, free  . TSH  . Testosterone Total,Free,Bio, Males  . VITAMIN D 25 Hydroxy (Vit-D Deficiency, Fractures)  . Lipid panel     Plan: 1. For his back pain, I am going to try him on Cymbalta 30 mg daily to see if this will help him.  I explained possible side effects. 2. He will continue with desiccated thyroid for his hypothyroidism as before. 3. He will continue on testosterone therapy for his decreased libido and lower energy which seems to  have been helped by this therapy. 4. He continues to take saw palmetto for his BPH symptoms and we will check a PSA again. 5. He will continue with vitamin D3 supplementation and we will  check levels again also. 6. Further recommendations will depend on all these results and I will see him for follow-up in about 3 months time by which time he will have seen Dr. Sherwood Gambler again and we will know how he is back is doing also.     Doree Albee, MD

## 2019-08-10 LAB — LIPID PANEL
Cholesterol: 201 mg/dL — ABNORMAL HIGH (ref <200)
HDL: 38 mg/dL — ABNORMAL LOW (ref >=40)
LDL Cholesterol (Calc): 144 mg/dL (calc) — ABNORMAL HIGH
Non-HDL Cholesterol (Calc): 163 mg/dL (calc) — ABNORMAL HIGH (ref <130)
Total CHOL/HDL Ratio: 5.3 (calc) — ABNORMAL HIGH (ref <5.0)
Triglycerides: 88 mg/dL (ref <150)

## 2019-08-10 LAB — COMPLETE METABOLIC PANEL WITH GFR
AG Ratio: 2.1 (calc) (ref 1.0–2.5)
ALT: 19 U/L (ref 9–46)
AST: 20 U/L (ref 10–35)
Albumin: 4.6 g/dL (ref 3.6–5.1)
Alkaline phosphatase (APISO): 35 U/L (ref 35–144)
BUN: 16 mg/dL (ref 7–25)
CO2: 30 mmol/L (ref 20–32)
Calcium: 9.6 mg/dL (ref 8.6–10.3)
Chloride: 101 mmol/L (ref 98–110)
Creat: 1.05 mg/dL (ref 0.70–1.18)
GFR, Est African American: 81 mL/min/{1.73_m2} (ref >=60)
GFR, Est Non African American: 70 mL/min/{1.73_m2} (ref >=60)
Globulin: 2.2 g/dL (calc) (ref 1.9–3.7)
Glucose, Bld: 93 mg/dL (ref 65–99)
Potassium: 4.4 mmol/L (ref 3.5–5.3)
Sodium: 138 mmol/L (ref 135–146)
Total Bilirubin: 1.3 mg/dL — ABNORMAL HIGH (ref 0.2–1.2)
Total Protein: 6.8 g/dL (ref 6.1–8.1)

## 2019-08-10 LAB — CBC
HCT: 49.5 % (ref 38.5–50.0)
Hemoglobin: 16.5 g/dL (ref 13.2–17.1)
MCH: 30.8 pg (ref 27.0–33.0)
MCHC: 33.3 g/dL (ref 32.0–36.0)
MCV: 92.4 fL (ref 80.0–100.0)
MPV: 9.8 fL (ref 7.5–12.5)
Platelets: 228 10*3/uL (ref 140–400)
RBC: 5.36 10*6/uL (ref 4.20–5.80)
RDW: 12.8 % (ref 11.0–15.0)
WBC: 6 10*3/uL (ref 3.8–10.8)

## 2019-08-10 LAB — TESTOSTERONE TOTAL,FREE,BIO, MALES
Albumin: 4.6 g/dL (ref 3.6–5.1)
Sex Hormone Binding: 50 nmol/L (ref 22–77)
Testosterone, Bioavailable: 215.4 ng/dL — ABNORMAL HIGH (ref 15.0–150.0)
Testosterone, Free: 102.6 pg/mL — ABNORMAL HIGH (ref 6.0–73.0)
Testosterone: 972 ng/dL — ABNORMAL HIGH (ref 250–827)

## 2019-08-10 LAB — VITAMIN D 25 HYDROXY (VIT D DEFICIENCY, FRACTURES): Vit D, 25-Hydroxy: 57 ng/mL (ref 30–100)

## 2019-08-10 LAB — TSH: TSH: 1.16 mIU/L (ref 0.40–4.50)

## 2019-08-10 LAB — PSA: PSA: 1.7 ng/mL (ref <=4.0)

## 2019-08-10 LAB — T3, FREE: T3, Free: 4.7 pg/mL — ABNORMAL HIGH (ref 2.3–4.2)

## 2019-08-11 ENCOUNTER — Encounter (INDEPENDENT_AMBULATORY_CARE_PROVIDER_SITE_OTHER): Payer: Self-pay | Admitting: Internal Medicine

## 2019-08-18 DIAGNOSIS — Z85828 Personal history of other malignant neoplasm of skin: Secondary | ICD-10-CM | POA: Diagnosis not present

## 2019-08-18 DIAGNOSIS — K130 Diseases of lips: Secondary | ICD-10-CM | POA: Diagnosis not present

## 2019-08-18 DIAGNOSIS — D1801 Hemangioma of skin and subcutaneous tissue: Secondary | ICD-10-CM | POA: Diagnosis not present

## 2019-08-18 DIAGNOSIS — L821 Other seborrheic keratosis: Secondary | ICD-10-CM | POA: Diagnosis not present

## 2019-08-24 ENCOUNTER — Encounter (INDEPENDENT_AMBULATORY_CARE_PROVIDER_SITE_OTHER): Payer: Self-pay | Admitting: Internal Medicine

## 2019-08-24 ENCOUNTER — Other Ambulatory Visit (INDEPENDENT_AMBULATORY_CARE_PROVIDER_SITE_OTHER): Payer: Self-pay | Admitting: Internal Medicine

## 2019-08-24 MED ORDER — DULOXETINE HCL 30 MG PO CPEP: 30.0000 mg | ORAL_CAPSULE | Freq: Every day | ORAL | 1 refills | Status: DC

## 2019-09-01 ENCOUNTER — Other Ambulatory Visit (INDEPENDENT_AMBULATORY_CARE_PROVIDER_SITE_OTHER): Payer: Self-pay | Admitting: Internal Medicine

## 2019-09-23 ENCOUNTER — Ambulatory Visit: Payer: Medicare Other | Admitting: Cardiology

## 2019-09-26 ENCOUNTER — Telehealth (INDEPENDENT_AMBULATORY_CARE_PROVIDER_SITE_OTHER): Payer: Medicare Other | Admitting: Internal Medicine

## 2019-09-26 ENCOUNTER — Encounter (INDEPENDENT_AMBULATORY_CARE_PROVIDER_SITE_OTHER): Payer: Self-pay | Admitting: Internal Medicine

## 2019-09-26 ENCOUNTER — Telehealth: Payer: Medicare Other | Admitting: Physician Assistant

## 2019-09-26 DIAGNOSIS — J302 Other seasonal allergic rhinitis: Secondary | ICD-10-CM | POA: Diagnosis not present

## 2019-09-26 DIAGNOSIS — K1379 Other lesions of oral mucosa: Secondary | ICD-10-CM

## 2019-09-26 MED ORDER — AZITHROMYCIN 250 MG PO TABS
ORAL_TABLET | ORAL | 0 refills | Status: DC
Start: 2019-09-26 — End: 2019-10-19

## 2019-09-26 MED ORDER — LIDOCAINE VISCOUS HCL 2 % MT SOLN
15.0000 mL | OROMUCOSAL | 0 refills | Status: AC | PRN
Start: 2019-09-26 — End: 2019-10-01

## 2019-09-26 NOTE — Progress Notes (Signed)
Metrics: Intervention Frequency ACO  Documented Smoking Status Yearly  Screened one or more times in 24 months  Cessation Counseling or  Active cessation medication Past 24 months  Past 24 months   Guideline developer: UpToDate (See UpToDate for funding source) Date Released: 2014       Wellness Office Visit  Subjective:  Patient ID: Brad Cruz, male    DOB: [birth year]  Age: 73 y.o. MRN: [id number]  CC: This is an audio telemedicine visit with the permission of the patient who is at home and I am in my office.  I was easily able to recognize his voice. His chief complaint is postnasal drainage and sore throat.  HPI  He has had the symptoms above for the last 2 days.  He denies any fever, myalgia, dyspnea, productive cough.  He denies any sneezing.  He gets these types of symptoms once every 18 months or so and antibiotics usually help him.  He has not been in contact with anyone with COVID-19. Past Medical History:  Diagnosis Date  . Allergy    Mold, cat dander  . Arthritis    cerv spine  . Asthma    Childhood history  . BPH (benign prostatic hyperplasia)   . Cataract   . Coronary atherosclerosis of native coronary artery    a. 06/2012 Myoview:  No ischemia;  b. 06/25/2012 Cath:  LM nl, LAD 50p, 28m, D1 80 ost (small), D2 nl (small), LCX 30 ost, OM1 nl, RI 30 ost, RCA 50-60- ost, 22m, PDA minor irregs, PL nl (small), EF 65%.   Marland Kitchen GERD (gastroesophageal reflux disease)   . Hyperlipidemia   . Hypothyroidism   . Migraines   . 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 Skin cancer, basal cell    BCC face  . Vasovagal syncope   . Vitamin D deficiency disease 08/09/2019      Family History  Problem Relation Age of Onset  . Heart disease Mother        age 58  . Transient ischemic attack Mother   . Stroke Mother   . Stomach cancer Sister   . Cancer Sister        age 34  . Colon polyps Brother   . Heart disease Brother   . Thyroid cancer Brother   . Alzheimer's  disease Father        49  . Colon cancer Neg Hx     Social History   Social History Narrative   Lives with Ronney Lion   Married 35 years   Involved with church and the [employer]    Social History   Tobacco Use  . Smoking status: Never Smoker  . Smokeless tobacco: Never Used  Substance Use Topics  . Alcohol use: No    Alcohol/week: 0.0 standard drinks    Current Meds  Medication Sig  . Alum Hydroxide-Mag Carbonate (GAVISCON EXTRA STRENGTH PO) Take 160 mg by mouth 2 (two) times daily.  . Cholecalciferol (VITAMIN D3 MAXIMUM STRENGTH) 125 MCG (5000 UT) capsule Take 5,000 Units by mouth daily.  . cimetidine (TAGAMET) 400 MG tablet Take 1 tablet (400 mg total) by mouth 2 (two) times daily. (Patient taking differently: Take 400 mg by mouth as needed. )  . DULoxetine (CYMBALTA) 30 MG capsule Take 1 capsule (30 mg total) by mouth daily.  . fexofenadine (ALLEGRA) 180 MG tablet Take 180 mg by mouth daily.  Marland Kitchen lidocaine (XYLOCAINE) 2 % solution Use as directed 15  mLs in the mouth or throat as needed for up to 5 days for mouth pain.  . NP THYROID 90 MG tablet TAKE 1 TABLET DAILY  . Psyllium (METAMUCIL FIBER PO) Take 5 mLs by mouth daily. Mix with water  . Saw Palmetto, Serenoa repens, 320 MG CAPS Take 640 mg at bedtime by mouth.   . testosterone cypionate (DEPOTESTOSTERONE CYPIONATE) 200 MG/ML injection Inject into the muscle once a week. 0.71ml weekly       Objective:   Today's Vitals: There were no vitals taken for this visit. Vitals with BMI 09/26/2019 08/09/2019 04/11/2019  Height (No Data) 5\' 9"  5\' 10"   Weight (No Data) 173 lbs 10 oz 181 lbs 10 oz  BMI - 123456 [id number]  Systolic (No Data) XX123456 Q000111Q  Diastolic (No Data) 70 75  Pulse - 72 82     Physical Exam   He appeared to have normal voice and orientation on the phone call.   Assessment   1. Seasonal allergies       Tests ordered No orders of the defined types were placed in this encounter.    Plan: 1. I recommended  taking over-the-counter allergy medicine. 2. I will send a prescription for Zithromax in case this is infective in origin. 3. If he does not improve, he will let us know. 4. I spent 13 minutes on this phone call.   Meds ordered this encounter  Medications  . azithromycin (ZITHROMAX) 250 MG tablet    Sig: Take 2 tablets the first day and then 1 tablet every day for the next 4 days    Dispense:  6 tablet    Refill:  0    Bilaal Leib Luther Parody, MD

## 2019-09-26 NOTE — Progress Notes (Signed)
We are sorry that you are not feeling well.  Here is how we plan to help!  It is difficult to tell what condition you may have just from your questionnaire answers and not being able to see pictures of your mouth or examine you properly. This may be a virus like pharyngitis or there may be an injury to the roof of your mouth from hot foods or even a fungal infection called thrush.  After careful review of your answers, I would not recommend and antibiotic for your condition.  Antibiotics should not be used to treat conditions that we suspect are caused by viruses like the virus that causes the common cold or flu. However, you may need formal testing or an examination in clinic or office to confirm if your symptoms continue or worsen.  In the meantime I will send you a prescription that can help with discomfort in the mouth. It will not treat any specific infection.  Home Care:  Only take medications as instructed by your medical team.  Do not drink alcohol while taking these medications.  A steam or ultrasonic humidifier can help congestion.  You can place a towel over your head and breathe in the steam from hot water coming from a faucet.  Avoid close contacts especially the very young and the elderly.  Cover your mouth when you cough or sneeze.  Always remember to wash your hands.  Get Help Right Away If:  You develop worsening fever or throat pain.  You develop a severe head ache or visual changes.  Your symptoms persist after you have completed your treatment plan.  Make sure you  Understand these instructions.  Will watch your condition.  Will get help right away if you are not doing well or get worse.  Your e-visit answers were reviewed by a board certified advanced clinical practitioner to complete your personal care plan.  Depending on the condition, your plan could have included both over the counter or prescription medications.  If there is a problem please reply   once you have received a response from your provider.  Your safety is important to [place].  If you have drug allergies check your prescription carefully.    You can use MyChart to ask questions about todays visit, request a non-urgent call back, or ask for a work or school excuse for 24 hours related to this e-Visit. If it has been greater than 24 hours you will need to follow up with your provider, or enter a new e-Visit to address those concerns.  You will get an e-mail in the next two days asking about your experience.  I hope that your e-visit has been valuable and will speed your recovery. Thank you for using e-visits.   10 minutes spent on this chart

## 2019-10-19 ENCOUNTER — Ambulatory Visit (INDEPENDENT_AMBULATORY_CARE_PROVIDER_SITE_OTHER): Payer: Medicare Other | Admitting: Family Medicine

## 2019-10-19 ENCOUNTER — Other Ambulatory Visit: Payer: Self-pay

## 2019-10-19 ENCOUNTER — Encounter: Payer: Self-pay | Admitting: Cardiology

## 2019-10-19 VITALS — BP 122/67 | HR 74 | Temp 96.9°F | Ht 69.5 in | Wt 177.0 lb

## 2019-10-19 DIAGNOSIS — I2510 Atherosclerotic heart disease of native coronary artery without angina pectoris: Secondary | ICD-10-CM

## 2019-10-19 DIAGNOSIS — E782 Mixed hyperlipidemia: Secondary | ICD-10-CM

## 2019-10-19 DIAGNOSIS — M519 Unspecified thoracic, thoracolumbar and lumbosacral intervertebral disc disorder: Secondary | ICD-10-CM

## 2019-10-19 NOTE — Progress Notes (Addendum)
Cardiology Office Note  Date: 10/19/2019   ID: MARQ MILIC, DOB 1946/03/06, MRN [id number]  PCP:  Doree Albee, MD  Cardiologist:  Rozann Lesches, MD Electrophysiologist:  None   Chief Complaint  Patient presents with  . Follow-up    History of Present Illness: Brad Cruz is a 73 y.o. male last encounter September 23, 2018 for follow-up visit with Dr. Domenic Polite.    History of coronary artery disease with non obstructive disease in 2013 by cath.  History of hyperlipidemia, hypothyroidism, GERD, vasovagal syncope.  Patient denies any recent progressive anginal or dyspnea.  Denies any near-syncope or syncopal episodes.  States has been having some issues with his back  and recently had an epidural steroid injection.  He had a recent MRI of his back showing progression of moderate right neural foraminal stenosis at L4 and L5.  Left L3-L4 subarticular disc protrusion with annular fissure.  Worsened from prior study with mild narrowing of the left lateral recess and left neural foramen.  Remote left L5 hemilaminectomy.  States he has been having some balance issues and problems with falling.  Patient denies any exposure to Covid virus.  No symptoms of Covid virus.  Past Medical History:  Diagnosis Date  . Allergy    Mold, cat dander  . Arthritis    cerv spine  . Asthma    Childhood history  . BPH (benign prostatic hyperplasia)   . Cataract   . Coronary atherosclerosis of native coronary artery    a. 06/2012 Myoview:  No ischemia;  b. 06/25/2012 Cath:  LM nl, LAD 50p, 1m, D1 80 ost (small), D2 nl (small), LCX 30 ost, OM1 nl, RI 30 ost, RCA 50-60- ost, 10m, PDA minor irregs, PL nl (small), EF 65%.   Marland Kitchen GERD (gastroesophageal reflux disease)   . Hyperlipidemia   . Hypothyroidism   . Migraines   . 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 Skin cancer, basal cell    BCC face  . Vasovagal syncope   . Vitamin D deficiency disease 08/09/2019    Past Surgical  History:  Procedure Laterality Date  . BIOPSY  09/09/2017   Procedure: BIOPSY;  Surgeon: Rogene Houston, MD;  Location: [facility];  Service: Endoscopy;;  gastric  . CARDIAC CATHETERIZATION    . COLONOSCOPY    . Deviated septum repair 1989    . ESOPHAGOGASTRODUODENOSCOPY N/A 09/09/2017   Procedure: ESOPHAGOGASTRODUODENOSCOPY (EGD);  Surgeon: Rogene Houston, MD;  Location: [facility];  Service: Endoscopy;  Laterality: N/A;  . LEFT HEART CATHETERIZATION WITH CORONARY ANGIOGRAM N/A 06/25/2012   Procedure: LEFT HEART CATHETERIZATION WITH CORONARY ANGIOGRAM;  Surgeon: Minus Breeding, MD;  Location: [facility] CATH LAB;  Service: Cardiovascular;  Laterality: N/A;  . LUMBAR LAMINECTOMY/DECOMPRESSION MICRODISCECTOMY  11/23/2012   Procedure: LUMBAR LAMINECTOMY/DECOMPRESSION MICRODISCECTOMY 1 LEVEL;  Surgeon: Floyce Stakes, MD;  Location: [facility] NEURO ORS;  Service: Neurosurgery;  Laterality: Left;  Left Lumbar five-sacral one Diskectomy  . SKIN CANCER EXCISION     Under left eye basal cell  . SPINE SURGERY      Current Outpatient Medications  Medication Sig Dispense Refill  . acetaminophen (TYLENOL) 500 MG tablet Take 500 mg by mouth every 6 (six) hours as needed.    . Cholecalciferol (VITAMIN D3 MAXIMUM STRENGTH) 125 MCG (5000 UT) capsule Take 5,000 Units by mouth daily.    . DULoxetine (CYMBALTA) 30 MG capsule Take 1 capsule (30 mg total) by mouth  daily. 90 capsule 1  . fexofenadine (ALLEGRA) 180 MG tablet Take 180 mg by mouth daily.    . NP THYROID 90 MG tablet TAKE 1 TABLET DAILY 90 tablet 1  . Psyllium (METAMUCIL FIBER PO) Take 5 mLs by mouth daily. Mix with water    . Saw Palmetto, Serenoa repens, 320 MG CAPS Take 640 mg at bedtime by mouth.     . testosterone cypionate (DEPOTESTOSTERONE CYPIONATE) 200 MG/ML injection Inject into the muscle once a week. 0.5ml weekly     No current facility-administered medications for this visit.   Allergies:  Citrus, Ibuprofen, Shellfish-derived products,  and Statins   Social History: The patient  reports that he has never smoked. He has never used smokeless tobacco. He reports that he does not drink alcohol or use drugs.   Family History: The patient's family history includes Alzheimer's disease in his father; Cancer in his sister; Colon polyps in his brother; Heart disease in his brother and mother; Stomach cancer in his sister; Stroke in his mother; Thyroid cancer in his brother; Transient ischemic attack in his mother.   ROS:  Please see the history of present illness. Otherwise, complete review of systems is positive for none.  All other systems are reviewed and negative.   Physical Exam: VS:  BP 122/67   Pulse 74   Temp (!) 96.9 F (36.1 C) (Skin)   Ht 5' 9.5" (1.765 m)   Wt 177 lb (80.3 kg)   SpO2 96%   BMI 25.76 kg/m , BMI Body mass index is 25.76 kg/m.  Wt Readings from Last 3 Encounters:  10/19/19 177 lb (80.3 kg)  08/09/19 173 lb 9.6 oz (78.7 kg)  04/11/19 181 lb 9.6 oz (82.4 kg)    General: Patient appears comfortable at rest. Neck: Supple, no elevated JVP or carotid bruits, no thyromegaly. Lungs: Clear to auscultation, nonlabored breathing at rest. Cardiac: Regular rate and rhythm, no S3 or significant systolic murmur, no pericardial rub. Extremities: No pitting edema, distal pulses 2+. Skin: Warm and dry. Neuropsychiatric: Alert and oriented x3, affect grossly appropriate.  ECG:  An ECG dated October 19, 2019 was personally reviewed today and demonstrated:  NSR rate of 63  Recent Labwork: 08/09/2019: ALT 19; AST 20; BUN 16; Creat 1.05; Hemoglobin 16.5; Platelets 228; Potassium 4.4; Sodium 138; TSH 1.16     Component Value Date/Time   CHOL 201 (H) 08/09/2019 1126   TRIG 88 08/09/2019 1126   HDL 38 (L) 08/09/2019 1126   CHOLHDL 5.3 (H) 08/09/2019 1126   LDLCALC 144 (H) 08/09/2019 1126    Other Studies Reviewed Today: Echocardiogram 09/10/2017: Study Conclusions  - Left ventricle: The cavity size was  normal. Wall thickness was increased in a pattern of mild LVH. Systolic function was normal. The estimated ejection fraction was in the range of 55% to 60%. Wall motion was normal; there were no regional wall motion abnormalities. Left ventricular diastolic function parameters were normal. - Mitral valve: There was mild regurgitation. - Right atrium: Central venous pressure (est): 8 mm Hg. - Tricuspid valve: There was trivial regurgitation. - Pulmonary arteries: PA peak pressure: 33 mm Hg (S). - Pericardium, extracardiac: There was no pericardial effusion.  Impressions:  - Mild LVH with LVEF 55-60% and normal diastolic function. Mild mitral regurgitation. Trivial tricuspid regurgitation with estimated PASP 33 mmHg.   Assessment and Plan:  1. Coronary artery disease involving native coronary artery of native heart without angina pectoris   2. Mixed hyperlipidemia   3.  Lumbar disc disease    1. Coronary artery disease involving native coronary artery of native heart without angina pectoris Patient denies any progressive anginal symptoms or dyspnea.  We will follow for any changes.  Advised to call if any new symptoms arise. Not on ASA. Will address at follow up visit.  2.  Hyperlipidemia Patient has been intolerant to multiple statins in the past.  Discussed PCSK9 injections especially Repatha for possible use.  Advised patient to discuss this with his primary care physician to see if he will administer the medications and check for affordability.  Patient verbalizes understanding.  3. Lumbar disc disease Patient has progressing lumbar disc disease with foraminal stenosis.  He has had surgical intervention in the past on L5.  He recently had epidural steroid injections with some relief.  He mentions having balance issues which could be related to his back.  He denies any orthostatic symptoms.  He sees a neurologist / neurosurgeon for these issues.  He had a hospital  visit in January 2020 for sciatica.  Medication Adjustments/Labs and Tests Ordered: Current medicines are reviewed at length with the patient today.  Concerns regarding medicines are outlined above.    Patient Instructions  Medication Instructions:  Your physician recommends that you continue on your current medications as directed. Please refer to the Current Medication list given to you today.   Labwork: none  Testing/Procedures: none  Follow-Up: Your physician wants you to follow-up in: 1 year.  You will receive a reminder letter in the mail two months in advance. If you don't receive a letter, please call our office to schedule the follow-up appointment.   Any Other Special Instructions Will Be Listed Below (If Applicable).     If you need a refill on your cardiac medications before your next appointment, please call your pharmacy.          Signed, Levell July, NP 10/19/2019 4:56 PM    [facility] [place], [place], [place] [address] Phone: [phone]; Fax: [phone]

## 2019-10-19 NOTE — Patient Instructions (Signed)

## 2019-11-01 ENCOUNTER — Ambulatory Visit (INDEPENDENT_AMBULATORY_CARE_PROVIDER_SITE_OTHER): Payer: Medicare Other | Admitting: Internal Medicine

## 2019-11-01 ENCOUNTER — Other Ambulatory Visit: Payer: Self-pay

## 2019-11-01 ENCOUNTER — Encounter (INDEPENDENT_AMBULATORY_CARE_PROVIDER_SITE_OTHER): Payer: Self-pay | Admitting: Internal Medicine

## 2019-11-01 VITALS — BP 118/72 | HR 79 | Temp 98.3°F | Resp 15 | Ht 69.5 in | Wt 177.0 lb

## 2019-11-01 DIAGNOSIS — E039 Hypothyroidism, unspecified: Secondary | ICD-10-CM | POA: Diagnosis not present

## 2019-11-01 DIAGNOSIS — E559 Vitamin D deficiency, unspecified: Secondary | ICD-10-CM | POA: Diagnosis not present

## 2019-11-01 DIAGNOSIS — E782 Mixed hyperlipidemia: Secondary | ICD-10-CM | POA: Diagnosis not present

## 2019-11-01 DIAGNOSIS — I2510 Atherosclerotic heart disease of native coronary artery without angina pectoris: Secondary | ICD-10-CM | POA: Diagnosis not present

## 2019-11-01 DIAGNOSIS — R6882 Decreased libido: Secondary | ICD-10-CM

## 2019-11-01 NOTE — Progress Notes (Signed)
Metrics: Intervention Frequency ACO  Documented Smoking Status Yearly  Screened one or more times in 24 months  Cessation Counseling or  Active cessation medication Past 24 months  Past 24 months   Guideline developer: UpToDate (See UpToDate for funding source) Date Released: 2014       Wellness Office Visit  Subjective:  Patient ID: Brad Cruz, male    DOB: Jan 13, 1946  Age: 73 y.o. MRN: [id number]  CC: This man comes in for follow-up of hypothyroidism, vitamin D deficiency, hyperlipidemia, coronary artery disease. HPI  He was seen by cardiology recently and was recommended to have Repatha injections as he does not tolerate statins.  He will think about this.  He is not very keen to undergo this at the present time overall. He tells me that the Cymbalta I had put him on last time has helped him tremendously in terms of his back pain.  He has seen neurosurgery and he is due to have injections in his back in January. He continues on desiccated NP thyroid and his levels were in a good range last time. He also continues on testosterone therapy as before.  He is tolerating this well and his levels were in a good range. He continues with vitamin D3 supplementation. Past Medical History:  Diagnosis Date  . Allergy    Mold, cat dander  . Arthritis    cerv spine  . Asthma    Childhood history  . BPH (benign prostatic hyperplasia)   . Cataract   . Coronary atherosclerosis of native coronary artery    a. 06/2012 Myoview:  No ischemia;  b. 06/25/2012 Cath:  LM nl, LAD 50p, 71m, D1 80 ost (small), D2 nl (small), LCX 30 ost, OM1 nl, RI 30 ost, RCA 50-60- ost, 36m, PDA minor irregs, PL nl (small), EF 65%.   Marland Kitchen GERD (gastroesophageal reflux disease)   . Hyperlipidemia   . Hypothyroidism   . Migraines   . 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 Skin cancer, basal cell    BCC face  . Vasovagal syncope   . Vitamin D deficiency disease 08/09/2019      Family History  Problem  Relation Age of Onset  . Heart disease Mother        age 77  . Transient ischemic attack Mother   . Stroke Mother   . Stomach cancer Sister   . Cancer Sister        age 73  . Colon polyps Brother   . Heart disease Brother   . Thyroid cancer Brother   . Alzheimer's disease Father        34  . Colon cancer Neg Hx     Social History   Social History Narrative   Lives with Ronney Lion   Married 35 years   Involved with church and the [employer]    Social History   Tobacco Use  . Smoking status: Never Smoker  . Smokeless tobacco: Never Used  Substance Use Topics  . Alcohol use: No    Alcohol/week: 0.0 standard drinks    Current Meds  Medication Sig  . acetaminophen (TYLENOL) 500 MG tablet Take 500 mg by mouth every 6 (six) hours as needed.  . Cholecalciferol (VITAMIN D3 MAXIMUM STRENGTH) 125 MCG (5000 UT) capsule Take 5,000 Units by mouth daily.  . DULoxetine (CYMBALTA) 30 MG capsule Take 1 capsule (30 mg total) by mouth daily.  . fexofenadine (ALLEGRA) 180 MG tablet Take 180 mg by  mouth daily.  . NP THYROID 90 MG tablet TAKE 1 TABLET DAILY  . Psyllium (METAMUCIL FIBER PO) Take 5 mLs by mouth daily. Mix with water  . Saw Palmetto, Serenoa repens, 320 MG CAPS Take 640 mg at bedtime by mouth.   . testosterone cypionate (DEPOTESTOSTERONE CYPIONATE) 200 MG/ML injection Inject into the muscle once a week. 0.45ml weekly      Bio Identical Hormones  Testosterone therapy is being used off label for symptoms of testosterone deficiency and benefits that it produces based on several studies.  These benefits include decreasing body fat, increasing in lean muscle mass and increasing in bone density.  There is improvement of memory, cognition.  There is improvement in exercise tolerance and endurance.  Testosterone therapy has also been shown to be protective against coronary artery disease, cerebrovascular disease, diabetes, hypertension and degenerative joint disease. I have discussed with  the patient the FDA warnings regarding testosterone therapy, benefits and side effects and modes of administration as well as monitoring blood levels and side effects  on a regular basis The patient is agreeable that testosterone therapy should be an integral part of his/her wellness,quality of life and prevention of chronic disease.  Objective:   Today's Vitals: BP 118/72   Pulse 79   Temp 98.3 F (36.8 C) (Temporal)   Resp 15   Ht 5' 9.5" (1.765 m)   Wt 177 lb (80.3 kg)   SpO2 95%   BMI 25.76 kg/m  Vitals with BMI 11/01/2019 10/19/2019 09/26/2019  Height 5' 9.5" 5' 9.5" (No Data)  Weight 177 lbs 177 lbs (No Data)  BMI 123XX123 [id number] -  Systolic [id number] (No Data)  Diastolic 72 67 (No Data)  Pulse 79 74 -     Physical Exam   He looks systemically well.  His weight is stable.  Blood pressure is controlled.  He is alert and orientated without any focal neurological signs.    Assessment   1. Vitamin D deficiency disease   2. Acquired hypothyroidism   3. Decreased libido   4. Mixed hyperlipidemia       Tests ordered No orders of the defined types were placed in this encounter.    Plan: 1. He will continue vitamin D3 supplementation for vitamin D deficiency. 2. He will continue with desiccated NP thyroid for his hypothyroidism. 3. He will continue with testosterone therapy as before. 4. He will think about Repatha for his hyperlipidemia and let the cardiologist know if he decides to go ahead with this. 5. Back pain-continue with Cymbalta and he is due to have injections in his back in January. 6. I will schedule him for an annual Medicare wellness visit with Judson Roch next April.   No orders of the defined types were placed in this encounter.   Doree Albee, MD

## 2019-11-17 DIAGNOSIS — M48061 Spinal stenosis, lumbar region without neurogenic claudication: Secondary | ICD-10-CM | POA: Diagnosis not present

## 2019-11-17 DIAGNOSIS — M5136 Other intervertebral disc degeneration, lumbar region: Secondary | ICD-10-CM | POA: Diagnosis not present

## 2019-11-17 DIAGNOSIS — M4726 Other spondylosis with radiculopathy, lumbar region: Secondary | ICD-10-CM | POA: Diagnosis not present

## 2019-12-09 DIAGNOSIS — Z23 Encounter for immunization: Secondary | ICD-10-CM | POA: Diagnosis not present

## 2019-12-27 DIAGNOSIS — Z20822 Contact with and (suspected) exposure to covid-19: Secondary | ICD-10-CM | POA: Diagnosis not present

## 2020-01-07 DIAGNOSIS — Z23 Encounter for immunization: Secondary | ICD-10-CM | POA: Diagnosis not present

## 2020-01-14 ENCOUNTER — Other Ambulatory Visit (INDEPENDENT_AMBULATORY_CARE_PROVIDER_SITE_OTHER): Payer: Self-pay | Admitting: Internal Medicine

## 2020-01-25 IMAGING — NM NM HEPATO W/GB/PHARM/[PERSON_NAME]
2 series · 12 of 12 positions shown · non-contrast
Comparison: None

CLINICAL DATA: Abdominal pain for few weeks, epigastric pain

EXAM:
NUCLEAR MEDICINE HEPATOBILIARY IMAGING WITH GALLBLADDER EF
TECHNIQUE: Sequential images of the abdomen were obtained [DATE] minutes
following intravenous administration of radiopharmaceutical. After
oral ingestion of Ensure, gallbladder ejection fraction was
determined. At 60 min, normal ejection fraction is greater than 33%.
RADIOPHARMACEUTICALS:  5 mCi Zc-HHm  Choletec IV

[Series 1: biliary · 3.25mm/px · 6 of 60 frames shown]
[frame 6/60]
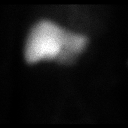
[frame 16/60]
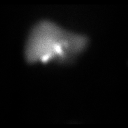
[frame 26/60]
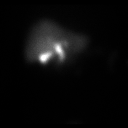
[frame 36/60]
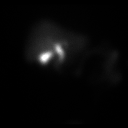
[frame 46/60]
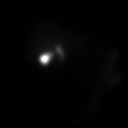
[frame 56/60]
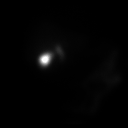

[Series 2: gbef · 3.25mm/px · 6 of 60 frames shown]
[frame 6/60]
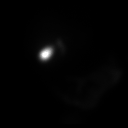
[frame 16/60]
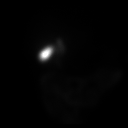
[frame 26/60]
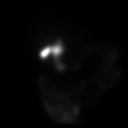
[frame 36/60]
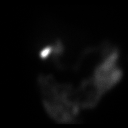
[frame 46/60]
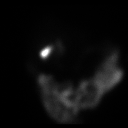
[frame 56/60]
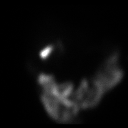

[12 of 12 positions shown; findings below may reference images not displayed]

FINDINGS: Normal tracer extraction from bloodstream indicating normal
hepatocellular function.

Normal excretion of tracer into biliary tree.

Gallbladder visualized at 17 min.

Small bowel visualized at 27 min.

No hepatic retention of tracer.

Subjectively normal emptying of tracer from gallbladder following
fatty meal stimulation.

Calculated gallbladder ejection fraction is 82%, normal.

Patient reported no symptoms following Ensure ingestion.

Normal gallbladder ejection fraction following Ensure ingestion is
greater than 33% at 1 hour.
IMPRESSION: Normal exam.

## 2020-02-16 ENCOUNTER — Other Ambulatory Visit (INDEPENDENT_AMBULATORY_CARE_PROVIDER_SITE_OTHER): Payer: Self-pay | Admitting: Internal Medicine

## 2020-02-16 ENCOUNTER — Other Ambulatory Visit: Payer: Self-pay

## 2020-02-16 ENCOUNTER — Ambulatory Visit (INDEPENDENT_AMBULATORY_CARE_PROVIDER_SITE_OTHER): Payer: Medicare Other | Admitting: Nurse Practitioner

## 2020-02-16 ENCOUNTER — Encounter (INDEPENDENT_AMBULATORY_CARE_PROVIDER_SITE_OTHER): Payer: Medicare Other | Admitting: Nurse Practitioner

## 2020-02-16 ENCOUNTER — Encounter (INDEPENDENT_AMBULATORY_CARE_PROVIDER_SITE_OTHER): Payer: Self-pay | Admitting: Nurse Practitioner

## 2020-02-16 VITALS — BP 115/70 | HR 68 | Temp 98.4°F | Ht 68.5 in | Wt 176.8 lb

## 2020-02-16 DIAGNOSIS — Z0000 Encounter for general adult medical examination without abnormal findings: Secondary | ICD-10-CM | POA: Diagnosis not present

## 2020-02-16 DIAGNOSIS — Z23 Encounter for immunization: Secondary | ICD-10-CM

## 2020-02-16 DIAGNOSIS — R5383 Other fatigue: Secondary | ICD-10-CM | POA: Diagnosis not present

## 2020-02-16 DIAGNOSIS — R438 Other disturbances of smell and taste: Secondary | ICD-10-CM | POA: Diagnosis not present

## 2020-02-16 DIAGNOSIS — R6889 Other general symptoms and signs: Secondary | ICD-10-CM

## 2020-02-16 MED ORDER — TESTOSTERONE CYPIONATE 200 MG/ML IM SOLN: 100.0000 mg | INTRAMUSCULAR | 2 refills | Status: DC

## 2020-02-16 NOTE — Progress Notes (Signed)
Subjective:   Brad Cruz is a 74 y.o. male who presents for Medicare Annual/Subsequent preventive examination.  Review of Systems:  Metallic taste in mouth; otherwise negative Cardiac Risk Factors include: none Brad Cruz is here today for his annual wellness visit.  He does mention to me that he is concerned that he has experienced a metallic taste in his mouth.  He tells me that this started since starting his Lotrimin cream that he was prescribed by his dermatologist for toenail fungus.  He is concerned that this might be a symptom of liver insult or liver failure.    Objective:    Vitals: BP 115/70 (BP Location: Left Arm, Patient Position: Sitting, Cuff Size: Normal)   Pulse 68   Temp 98.4 F (36.9 C) (Temporal)   Ht 5' 8.5" (1.74 m)   Wt 176 lb 12.8 oz (80.2 kg)   SpO2 97%   BMI 26.49 kg/m   Body mass index is 26.49 kg/m.  Advanced Directives 02/16/2020 09/09/2017 09/09/2017 09/09/2017 03/21/2014 11/23/2012 11/23/2012  Does Patient Have a Medical Advance Directive? Yes Yes No Yes Patient has advance directive, copy not in chart Patient has advance directive, copy not in chart Patient would like information  Type of Advance Directive - [place];Living will - [place];Living will - Other (Comment) -  Does patient want to make changes to medical advance directive? Yes (MAU/Ambulatory/Procedural Areas - Information given) No - Patient declined - - - - -  Copy of [place] in Chart? Yes - validated most recent copy scanned in chart (See row information) No - copy requested - No - copy requested - - -  Would patient like information on creating a medical advance directive? - - - - - Other (Comment) Advance directive packet given  Pre-existing out of facility DNR order (yellow form or pink MOST form) - - - - - No No    Tobacco Social History   Tobacco Use  Smoking Status Never Smoker  Smokeless Tobacco Never Used       Counseling given: Not Answered   Clinical Intake:  Pre-visit preparation completed: Yes  Pain : 0-10 Pain Score: 3  Pain Type: Neuropathic pain Pain Location: Foot Pain Orientation: Left Pain Radiating Towards: neuropahy pain in left foot Pain Descriptors / Indicators: Burning Pain Onset: More than a month ago Pain Frequency: Constant Pain Relieving Factors: just sits down and takes pressure off Effect of Pain on Daily Activities: somewhat  Pain Relieving Factors: just sits down and takes pressure off  Nutritional Status: BMI of 19-24  Normal Nutritional Risks: None Diabetes: No  How often do you need to have someone help you when you read instructions, pamphlets, or other written materials from your doctor or pharmacy?: 1 - Never What is the last grade level you completed in school?: bs degree  Interpreter Needed?: No  Information entered by :: Awilda Bill, CMA  Past Medical History:  Diagnosis Date  . Allergy    Mold, cat dander  . Arthritis    cerv spine  . Asthma    Childhood history  . BPH (benign prostatic hyperplasia)   . Cataract   . Coronary atherosclerosis of native coronary artery    a. 06/2012 Myoview:  No ischemia;  b. 06/25/2012 Cath:  LM nl, LAD 50p, 55m, D1 80 ost (small), D2 nl (small), LCX 30 ost, OM1 nl, RI 30 ost, RCA 50-60- ost, 64m, PDA minor irregs, PL nl (small),  EF 65%.   Marland Kitchen GERD (gastroesophageal reflux disease)   . Hyperlipidemia   . Hypothyroidism   . Migraines   . 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 Skin cancer, basal cell    BCC face  . Vasovagal syncope   . Vitamin D deficiency disease 08/09/2019   Past Surgical History:  Procedure Laterality Date  . BIOPSY  09/09/2017   Procedure: BIOPSY;  Surgeon: Rogene Houston, MD;  Location: [facility];  Service: Endoscopy;;  gastric  . CARDIAC CATHETERIZATION    . COLONOSCOPY    . Deviated septum repair 1989    . ESOPHAGOGASTRODUODENOSCOPY N/A 09/09/2017   Procedure:  ESOPHAGOGASTRODUODENOSCOPY (EGD);  Surgeon: Rogene Houston, MD;  Location: [facility];  Service: Endoscopy;  Laterality: N/A;  . LEFT HEART CATHETERIZATION WITH CORONARY ANGIOGRAM N/A 06/25/2012   Procedure: LEFT HEART CATHETERIZATION WITH CORONARY ANGIOGRAM;  Surgeon: Minus Breeding, MD;  Location: [facility] CATH LAB;  Service: Cardiovascular;  Laterality: N/A;  . LUMBAR LAMINECTOMY/DECOMPRESSION MICRODISCECTOMY  11/23/2012   Procedure: LUMBAR LAMINECTOMY/DECOMPRESSION MICRODISCECTOMY 1 LEVEL;  Surgeon: Floyce Stakes, MD;  Location: [facility]URO ORS;  Service: Neurosurgery;  Laterality: Left;  Left Lumbar five-sacral one Diskectomy  . SKIN CANCER EXCISION     Under left eye basal cell  . SPINE SURGERY     Family History  Problem Relation Age of Onset  . Heart disease Mother        age 68  . Transient ischemic attack Mother   . Stroke Mother   . Stomach cancer Sister   . Cancer Sister        age 69  . Colon polyps Brother   . Heart disease Brother   . Thyroid cancer Brother   . Alzheimer's disease Father        56  . Colon cancer Neg Hx    Social History   Socioeconomic History  . Marital status: Married    Spouse name: bettie  . Number of children: 1  . Years of education: 94  . Highest education level: Professional school degree (e.g., MD, DDS, DVM, JD)  Occupational History  . Occupation: Therapist, art x 5 years    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  . Occupation: attorney  retired  Tobacco Use  . Smoking status: Never Smoker  . Smokeless tobacco: Never Used  Substance and Sexual Activity  . Alcohol use: No    Alcohol/week: 0.0 standard drinks  . Drug use: No  . Sexual activity: Yes    Partners: Female    Birth control/protection: Post-menopausal  Other Topics Concern  . Not on file  Social History Narrative   Lives with Brad Cruz   Married 35 years   Involved with church and the [employer]    Social Determinants of Health   Financial Resource Strain:   . Difficulty of Paying Living  Expenses:   Food Insecurity:   . Worried About Charity fundraiser in the Last Year:   . Arboriculturist in the Last Year:   Transportation Needs:   . Film/video editor (Medical):   Marland Kitchen Lack of Transportation (Non-Medical):   Physical Activity:   . Days of Exercise per Week:   . Minutes of Exercise per Session:   Stress:   . Feeling of Stress :   Social Connections:   . Frequency of Communication with Friends and Family:   . Frequency of Social Gatherings with Friends and Family:   . Attends  Religious Services:   . Active Member of Clubs or Organizations:   . Attends Archivist Meetings:   Marland Kitchen Marital Status:     Outpatient Encounter Medications as of 02/16/2020  Medication Sig  . acetaminophen (TYLENOL) 500 MG tablet Take 500 mg by mouth every 6 (six) hours as needed.  . Cholecalciferol (VITAMIN D3 MAXIMUM STRENGTH) 125 MCG (5000 UT) capsule Take 5,000 Units by mouth in the morning and at bedtime. 10,000 units  . clotrimazole (LOTRIMIN AF) 1 % cream Apply 1 application topically 2 (two) times daily.  . DULoxetine (CYMBALTA) 30 MG capsule Take 1 capsule (30 mg total) by mouth daily.  . fexofenadine (ALLEGRA) 180 MG tablet Take 180 mg by mouth daily.  . NP THYROID 90 MG tablet TAKE 1 TABLET DAILY  . Psyllium (METAMUCIL FIBER PO) Take 5 mLs by mouth daily. Mix with water  . Saw Palmetto, Serenoa repens, 320 MG CAPS Take 640 mg at bedtime by mouth.   . testosterone cypionate (DEPOTESTOSTERONE CYPIONATE) 200 MG/ML injection Inject into the muscle once a week. 0.26ml weekly  . [DISCONTINUED] NP THYROID 90 MG tablet TAKE 1 TABLET DAILY   No facility-administered encounter medications on file as of 02/16/2020.    Activities of Daily Living In your present state of health, do you have any difficulty performing the following activities: 02/16/2020  Hearing? N  Vision? Y  Comment wears glasses  Difficulty concentrating or making decisions? Y  Comment short term memory issues    Walking or climbing stairs? N  Dressing or bathing? N  Doing errands, shopping? N  Preparing Food and eating ? N  Using the Toilet? N  In the past six months, have you accidently leaked urine? N  Do you have problems with loss of bowel control? N  Managing your Medications? N  Managing your Finances? N  Housekeeping or managing your Housekeeping? N  Some recent data might be hidden    Patient Care Team: Doree Albee, MD as PCP - General (Internal Medicine) Satira Sark, MD as PCP - Cardiology (Cardiology)   Assessment:   This is a routine wellness examination for [place].  Exercise Activities and Dietary recommendations Current Exercise Habits: The patient does not participate in regular exercise at present, Exercise limited by: None identified  Goals   None     Fall Risk Fall Risk  02/16/2020 02/17/2018 02/17/2018 11/19/2017  Falls in the past year? 0 Yes No No  Number falls in past yr: 0 1 - -  Injury with Fall? 0 No - -  Risk for fall due to : No Fall Risks - - -  Follow up Falls evaluation completed - - -   Is the patient's home free of loose throw rugs in walkways, pet beds, electrical cords, etc?   yes      Grab bars in the bathroom? yes      Handrails on the stairs?   yes      Adequate lighting?   yes  Timed Get Up and Go Performed: 7 seconds  Depression Screen PHQ 2/9 Scores 02/16/2020 02/17/2018 02/17/2018 11/19/2017  PHQ - 2 Score 0 0 0 0  Exception Documentation Medical reason - - -    Cognitive Function     6CIT Screen 02/16/2020  What Year? 0 points  What month? 0 points  What time? 0 points  Count back from 20 0 points  Months in reverse 0 points  Repeat phrase 0 points  Total Score  0    Immunization History  Administered Date(s) Administered  . Fluad Quad(high Dose 65+) 09/08/2019  . Influenza Split 11/24/2012, 08/12/2017  . Influenza,inj,Quad PF,6+ Mos 08/27/2016  . Pneumococcal Conjugate-13 11/19/2017  . Pneumococcal  Polysaccharide-23 11/24/2012  . Zoster Recombinat (Shingrix) 08/01/2019    Qualifies for Shingles Vaccine? Completed  Screening Tests Health Maintenance  Topic Date Due  . TETANUS/TDAP  Never done  . INFLUENZA VACCINE  06/03/2020  . COLONOSCOPY  03/21/2024  . Hepatitis C Screening  Completed  . PNA vac Low Risk Adult  Completed   Cancer Screenings: Lung: Low Dose CT Chest recommended if Age 65-80 years, 30 pack-year currently smoking OR have quit w/in 15years. Patient does not qualify. Colorectal: Up-to-date  Additional Screenings:  Hepatitis C Screening: Completed and negative      Plan:   Regarding the metallic taste, we will collect complete metabolic panel as well as B12 level and thyroid panel for further evaluation today.  I told him if his results come back normal that he may consider trying to stop the cream for couple weeks to see if a metallic taste resolves, or if he can tolerate the metallic taste he can continue taking the cream until his fungal infection has resolved.  He tells me he will wait to see what his results and the blood work shows.  He is up-to-date with all immunizations except for tetanus, will administer this today.  I did discuss that most common side effect is pain and tenderness at injection site, and rarely individuals to have an allergic reaction.  I did tell him if he experiences any difficulty breathing or swelling of, tongue, or mouth that he should call 911.  He tells me he understands.  He is up-to-date with colon cancer screening, does not require tobacco cessation conversation, has declined to undergo sexual transmitted infection screening, does not require or qualify for AAA screening, depression screen was negative today, fall screening showed low fall risk, hepatitis C screening was done in 2019 and he tells me that the results are negative, does not qualify for lung cancer screening.  I have personally reviewed and noted the following in the  patient's chart:   . Medical and social history . Use of alcohol, tobacco or illicit drugs  . Current medications and supplements . Functional ability and status . Nutritional status . Physical activity . Advanced directives . List of other physicians . Hospitalizations, surgeries, and ER visits in previous 12 months . Vitals . Screenings to include cognitive, depression, and falls . Referrals and appointments  In addition, I have reviewed and discussed with patient certain preventive protocols, quality metrics, and best practice recommendations. A written personalized care plan for preventive services as well as general preventive health recommendations were provided to patient.   He will follow-up in approximately 2 months with Dr. [name], or sooner as needed.  Rodney Langton, [place]  02/16/2020

## 2020-02-17 ENCOUNTER — Encounter (INDEPENDENT_AMBULATORY_CARE_PROVIDER_SITE_OTHER): Payer: Self-pay | Admitting: Nurse Practitioner

## 2020-02-17 LAB — COMPLETE METABOLIC PANEL WITH GFR
AG Ratio: 2.1 (calc) (ref 1.0–2.5)
ALT: 15 U/L (ref 9–46)
AST: 19 U/L (ref 10–35)
Albumin: 4.4 g/dL (ref 3.6–5.1)
Alkaline phosphatase (APISO): 36 U/L (ref 35–144)
BUN: 14 mg/dL (ref 7–25)
CO2: 30 mmol/L (ref 20–32)
Calcium: 9.5 mg/dL (ref 8.6–10.3)
Chloride: 100 mmol/L (ref 98–110)
Creat: 0.97 mg/dL (ref 0.70–1.18)
GFR, Est African American: 89 mL/min/{1.73_m2} (ref >=60)
GFR, Est Non African American: 77 mL/min/{1.73_m2} (ref >=60)
Globulin: 2.1 g/dL (calc) (ref 1.9–3.7)
Glucose, Bld: 86 mg/dL (ref 65–99)
Potassium: 4.7 mmol/L (ref 3.5–5.3)
Sodium: 139 mmol/L (ref 135–146)
Total Bilirubin: 0.8 mg/dL (ref 0.2–1.2)
Total Protein: 6.5 g/dL (ref 6.1–8.1)

## 2020-02-17 LAB — VITAMIN B12: Vitamin B-12: 306 pg/mL (ref 200–1100)

## 2020-02-17 LAB — TSH: TSH: 1.19 mIU/L (ref 0.40–4.50)

## 2020-02-17 LAB — T4, FREE: Free T4: 1 ng/dL (ref 0.8–1.8)

## 2020-02-17 LAB — T3, FREE: T3, Free: 4.1 pg/mL (ref 2.3–4.2)

## 2020-02-21 DIAGNOSIS — M48062 Spinal stenosis, lumbar region with neurogenic claudication: Secondary | ICD-10-CM | POA: Diagnosis not present

## 2020-02-21 DIAGNOSIS — M47816 Spondylosis without myelopathy or radiculopathy, lumbar region: Secondary | ICD-10-CM | POA: Diagnosis not present

## 2020-02-21 DIAGNOSIS — M5136 Other intervertebral disc degeneration, lumbar region: Secondary | ICD-10-CM | POA: Diagnosis not present

## 2020-02-21 DIAGNOSIS — M5416 Radiculopathy, lumbar region: Secondary | ICD-10-CM | POA: Diagnosis not present

## 2020-04-03 ENCOUNTER — Other Ambulatory Visit (INDEPENDENT_AMBULATORY_CARE_PROVIDER_SITE_OTHER): Payer: Self-pay | Admitting: Internal Medicine

## 2020-04-17 ENCOUNTER — Ambulatory Visit (INDEPENDENT_AMBULATORY_CARE_PROVIDER_SITE_OTHER): Payer: Medicare Other | Admitting: Internal Medicine

## 2020-04-25 ENCOUNTER — Other Ambulatory Visit: Payer: Self-pay

## 2020-04-25 ENCOUNTER — Telehealth (INDEPENDENT_AMBULATORY_CARE_PROVIDER_SITE_OTHER): Payer: Self-pay

## 2020-04-25 ENCOUNTER — Encounter (INDEPENDENT_AMBULATORY_CARE_PROVIDER_SITE_OTHER): Payer: Self-pay | Admitting: Internal Medicine

## 2020-04-25 ENCOUNTER — Ambulatory Visit (INDEPENDENT_AMBULATORY_CARE_PROVIDER_SITE_OTHER): Payer: Medicare Other | Admitting: Internal Medicine

## 2020-04-25 VITALS — BP 120/60 | HR 74 | Temp 97.8°F | Resp 18 | Ht 69.0 in | Wt 179.2 lb

## 2020-04-25 DIAGNOSIS — R5383 Other fatigue: Secondary | ICD-10-CM | POA: Diagnosis not present

## 2020-04-25 DIAGNOSIS — R6882 Decreased libido: Secondary | ICD-10-CM

## 2020-04-25 DIAGNOSIS — E039 Hypothyroidism, unspecified: Secondary | ICD-10-CM | POA: Diagnosis not present

## 2020-04-25 NOTE — Progress Notes (Signed)
Metrics: Intervention Frequency ACO  Documented Smoking Status Yearly  Screened one or more times in 24 months  Cessation Counseling or  Active cessation medication Past 24 months  Past 24 months   Guideline developer: UpToDate (See UpToDate for funding source) Date Released: 2014       Wellness Office Visit  Subjective:  Patient ID: DUGLAS Cruz, male    DOB: 1946/09/07  Age: 74 y.o. MRN: [id number]  CC: This man comes in for follow-up of hypothyroidism, testosterone deficiency symptoms and testosterone therapy related to this. HPI  He is doing reasonably well.  He has reduced his dose of testosterone to 0.45 mL once a week. He continues on NP thyroid 90 mg daily and is tolerating this well.  Previous blood work were acceptable. He is also taking B12 supplementation which seems to help his energy levels. Past Medical History:  Diagnosis Date  . Allergy    Mold, cat dander  . Arthritis    cerv spine  . Asthma    Childhood history  . BPH (benign prostatic hyperplasia)   . Cataract   . Coronary atherosclerosis of native coronary artery    a. 06/2012 Myoview:  No ischemia;  b. 06/25/2012 Cath:  LM nl, LAD 50p, 49m, D1 80 ost (small), D2 nl (small), LCX 30 ost, OM1 nl, RI 30 ost, RCA 50-60- ost, 2m, PDA minor irregs, PL nl (small), EF 65%.   Marland Kitchen GERD (gastroesophageal reflux disease)   . Hyperlipidemia   . Hypothyroidism   . Migraines   . 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 Skin cancer, basal cell    BCC face  . Vasovagal syncope   . Vitamin D deficiency disease 08/09/2019   Past Surgical History:  Procedure Laterality Date  . BIOPSY  09/09/2017   Procedure: BIOPSY;  Surgeon: Rogene Houston, MD;  [facility];  Service: Endoscopy;;  gastric  . CARDIAC CATHETERIZATION    . COLONOSCOPY    . Deviated septum repair 1989    . ESOPHAGOGASTRODUODENOSCOPY N/A 09/09/2017   Procedure: ESOPHAGOGASTRODUODENOSCOPY (EGD);  Surgeon: Rogene Houston, MD;  [facility];  Service: Endoscopy;  Laterality: N/A;  . LEFT HEART CATHETERIZATION WITH CORONARY ANGIOGRAM N/A 06/25/2012   Procedure: LEFT HEART CATHETERIZATION WITH CORONARY ANGIOGRAM;  Surgeon: Minus Breeding, MD;  Location: [facility] CATH LAB;  Service: Cardiovascular;  Laterality: N/A;  . LUMBAR LAMINECTOMY/DECOMPRESSION MICRODISCECTOMY  11/23/2012   Procedure: LUMBAR LAMINECTOMY/DECOMPRESSION MICRODISCECTOMY 1 LEVEL;  Surgeon: Floyce Stakes, MD;  [facility];  Service: Neurosurgery;  Laterality: Left;  Left Lumbar five-sacral one Diskectomy  . SKIN CANCER EXCISION     Under left eye basal cell  . SPINE SURGERY       Family History  Problem Relation Age of Onset  . Heart disease Mother        age 43  . Transient ischemic attack Mother   . Stroke Mother   . Stomach cancer Sister   . Cancer Sister        age 38  . Colon polyps Brother   . Heart disease Brother   . Thyroid cancer Brother   . Alzheimer's disease Father        49  . Colon cancer Neg Hx     Social History   Social History Narrative   Lives with Ronney Lion   Married 35 years   Involved with church and the [place]    Social History  Tobacco Use  . Smoking status: Never Smoker  . Smokeless tobacco: Never Used  Substance Use Topics  . Alcohol use: No    Alcohol/week: 0.0 standard drinks    Current Meds  Medication Sig  . acetaminophen (TYLENOL) 500 MG tablet Take 500 mg by mouth every 6 (six) hours as needed.  . Cholecalciferol (VITAMIN D3 MAXIMUM STRENGTH) 125 MCG (5000 UT) capsule Take 5,000 Units by mouth in the morning and at bedtime. 10,000 units  . clotrimazole (LOTRIMIN AF) 1 % cream Apply 1 application topically 2 (two) times daily.  . fexofenadine (ALLEGRA) 180 MG tablet Take 180 mg by mouth daily.  . NP THYROID 90 MG tablet TAKE 1 TABLET DAILY  . Psyllium (METAMUCIL FIBER PO) Take 5 mLs by mouth daily. Mix with water  . SAFETY-LOK 3CC SYR 22GX1.5" 22G X 1-1/2" 3 ML MISC   . Saw Palmetto,  Serenoa repens, 320 MG CAPS Take 640 mg at bedtime by mouth.   . testosterone cypionate (DEPOTESTOSTERONE CYPIONATE) 200 MG/ML injection Inject 0.5 mLs (100 mg total) into the muscle once a week. 0.72ml weekly  . vitamin B-12 (CYANOCOBALAMIN) 1000 MCG tablet Take 1,000 mcg by mouth daily.       Depression screen [facility] 2/9 02/16/2020 02/17/2018 02/17/2018 11/19/2017  Decreased Interest 0 0 0 0  Down, Depressed, Hopeless 0 0 0 0  PHQ - 2 Score 0 0 0 0     Objective:   Today's Vitals: BP 120/60 (BP Location: Left Arm, Patient Position: Sitting, Cuff Size: Normal)   Pulse 74   Temp 97.8 F (36.6 C)   Resp 18   Ht 5\' 9"  (1.753 m)   Wt 179 lb 3.2 oz (81.3 kg)   SpO2 98%   BMI 26.46 kg/m  Vitals with BMI 04/25/2020 02/16/2020 11/01/2019  Height 5\' 9"  5' 8.5" 5' 9.5"  Weight 179 lbs 3 oz 176 lbs 13 oz 177 lbs  BMI 26.45 84.16 60.63  Systolic 016 010 932  Diastolic 60 70 72  Pulse 74 68 79     Physical Exam  He looks systemically well.  Weight is stable.  Blood pressure is well controlled.     Assessment   1. Fatigue, unspecified type   2. Decreased libido   3. Acquired hypothyroidism       Tests ordered No orders of the defined types were placed in this encounter.    Plan: 1. He will continue with testosterone therapy for symptoms of testosterone deficiency, off label.  He is tolerating this well.  I recommended he go up to the dose of 0.5 mL once a week now. 2. He will continue with NP thyroid 90 mg daily for his hypothyroidism.  His levels previously were in a good range. 3. I will see him in 4 months time for follow-up and we will repeat blood work at that time.   No orders of the defined types were placed in this encounter.   Doree Albee, MD

## 2020-04-25 NOTE — Telephone Encounter (Signed)
I received a piece or paper for information regarding peer to peer for another patient. I do not see that this patient has had an MRI scan ordered. Was this telephone encounter placed in this patient's chart as an error?

## 2020-04-26 DIAGNOSIS — M5416 Radiculopathy, lumbar region: Secondary | ICD-10-CM | POA: Diagnosis not present

## 2020-04-26 DIAGNOSIS — M5116 Intervertebral disc disorders with radiculopathy, lumbar region: Secondary | ICD-10-CM | POA: Diagnosis not present

## 2020-06-04 DIAGNOSIS — H35372 Puckering of macula, left eye: Secondary | ICD-10-CM | POA: Diagnosis not present

## 2020-06-04 DIAGNOSIS — H31093 Other chorioretinal scars, bilateral: Secondary | ICD-10-CM | POA: Diagnosis not present

## 2020-06-04 DIAGNOSIS — H35361 Drusen (degenerative) of macula, right eye: Secondary | ICD-10-CM | POA: Diagnosis not present

## 2020-06-04 DIAGNOSIS — H25812 Combined forms of age-related cataract, left eye: Secondary | ICD-10-CM | POA: Diagnosis not present

## 2020-06-04 DIAGNOSIS — H2511 Age-related nuclear cataract, right eye: Secondary | ICD-10-CM | POA: Diagnosis not present

## 2020-06-19 ENCOUNTER — Other Ambulatory Visit (INDEPENDENT_AMBULATORY_CARE_PROVIDER_SITE_OTHER): Payer: Self-pay | Admitting: Internal Medicine

## 2020-06-21 DIAGNOSIS — H25812 Combined forms of age-related cataract, left eye: Secondary | ICD-10-CM | POA: Diagnosis not present

## 2020-06-21 DIAGNOSIS — H35372 Puckering of macula, left eye: Secondary | ICD-10-CM | POA: Diagnosis not present

## 2020-06-21 DIAGNOSIS — H3554 Dystrophies primarily involving the retinal pigment epithelium: Secondary | ICD-10-CM | POA: Diagnosis not present

## 2020-06-28 ENCOUNTER — Telehealth (INDEPENDENT_AMBULATORY_CARE_PROVIDER_SITE_OTHER): Payer: Self-pay

## 2020-06-28 ENCOUNTER — Encounter (INDEPENDENT_AMBULATORY_CARE_PROVIDER_SITE_OTHER): Payer: Self-pay | Admitting: Internal Medicine

## 2020-06-28 NOTE — Telephone Encounter (Signed)
Okay, I have sent him a my chart message now.

## 2020-08-21 DIAGNOSIS — L820 Inflamed seborrheic keratosis: Secondary | ICD-10-CM | POA: Diagnosis not present

## 2020-08-21 DIAGNOSIS — D692 Other nonthrombocytopenic purpura: Secondary | ICD-10-CM | POA: Diagnosis not present

## 2020-08-21 DIAGNOSIS — L821 Other seborrheic keratosis: Secondary | ICD-10-CM | POA: Diagnosis not present

## 2020-08-21 DIAGNOSIS — D1801 Hemangioma of skin and subcutaneous tissue: Secondary | ICD-10-CM | POA: Diagnosis not present

## 2020-08-21 DIAGNOSIS — Z85828 Personal history of other malignant neoplasm of skin: Secondary | ICD-10-CM | POA: Diagnosis not present

## 2020-08-29 ENCOUNTER — Ambulatory Visit (INDEPENDENT_AMBULATORY_CARE_PROVIDER_SITE_OTHER): Payer: Medicare Other | Admitting: Internal Medicine

## 2020-08-29 ENCOUNTER — Encounter (INDEPENDENT_AMBULATORY_CARE_PROVIDER_SITE_OTHER): Payer: Self-pay | Admitting: Internal Medicine

## 2020-08-29 ENCOUNTER — Other Ambulatory Visit: Payer: Self-pay

## 2020-08-29 VITALS — BP 122/70 | HR 63 | Temp 97.1°F | Ht 69.0 in | Wt 176.0 lb

## 2020-08-29 DIAGNOSIS — R6882 Decreased libido: Secondary | ICD-10-CM

## 2020-08-29 DIAGNOSIS — Z125 Encounter for screening for malignant neoplasm of prostate: Secondary | ICD-10-CM | POA: Diagnosis not present

## 2020-08-29 DIAGNOSIS — R519 Headache, unspecified: Secondary | ICD-10-CM | POA: Diagnosis not present

## 2020-08-29 DIAGNOSIS — E039 Hypothyroidism, unspecified: Secondary | ICD-10-CM

## 2020-08-29 MED ORDER — NEOMYCIN-POLYMYXIN-HC 1 % OT SOLN: 3.0000 [drp] | Freq: Four times a day (QID) | OTIC | 0 refills | Status: DC

## 2020-08-29 NOTE — Progress Notes (Signed)
Metrics: Intervention Frequency ACO  Documented Smoking Status Yearly  Screened one or more times in 24 months  Cessation Counseling or  Active cessation medication Past 24 months  Past 24 months   Guideline developer: UpToDate (See UpToDate for funding source) Date Released: 2014       Wellness Office Visit  Subjective:  Patient ID: Brad Cruz, male    DOB: May 04, 1946  Age: 74 y.o. MRN: [id number]  CC: This man comes in for follow-up of hypothyroidism and testosterone therapy. HPI He is gradually building up the dose of testosterone to 0.5 cc once a week. He continues on NP thyroid 90 mg daily and is tolerating it well. Today is complaining of frontal headaches which tend to occur on a daily basis and he says that the allergy medicine seems to help it. He also feels that he is taking on too much work and this appears to have caused him some stress. The headache does not appear to be associated with photophobia and he does not feel this is a migrainous headache. He also complains of itchy ears.  Past Medical History:  Diagnosis Date  . Allergy    Mold, cat dander  . Arthritis    cerv spine  . Asthma    Childhood history  . BPH (benign prostatic hyperplasia)   . Cataract   . Coronary atherosclerosis of native coronary artery    a. 06/2012 Myoview:  No ischemia;  b. 06/25/2012 Cath:  LM nl, LAD 50p, 16m, D1 80 ost (small), D2 nl (small), LCX 30 ost, OM1 nl, RI 30 ost, RCA 50-60- ost, 40m, PDA minor irregs, PL nl (small), EF 65%.   Marland Kitchen GERD (gastroesophageal reflux disease)   . Hyperlipidemia   . Hypothyroidism   . Migraines   . 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 Skin cancer, basal cell    BCC face  . Vasovagal syncope   . Vitamin D deficiency disease 08/09/2019   Past Surgical History:  Procedure Laterality Date  . BIOPSY  09/09/2017   Procedure: BIOPSY;  Surgeon: Rogene Houston, MD;  [facility];  Service: Endoscopy;;  gastric  . CARDIAC  CATHETERIZATION    . COLONOSCOPY    . Deviated septum repair 1989    . ESOPHAGOGASTRODUODENOSCOPY N/A 09/09/2017   Procedure: ESOPHAGOGASTRODUODENOSCOPY (EGD);  Surgeon: Rogene Houston, MD;  [facility];  Service: Endoscopy;  Laterality: N/A;  . LEFT HEART CATHETERIZATION WITH CORONARY ANGIOGRAM N/A 06/25/2012   Procedure: LEFT HEART CATHETERIZATION WITH CORONARY ANGIOGRAM;  Surgeon: Minus Breeding, MD;  Location[facility] C[facility] LAB;  Service: Cardiovascular;  Laterality: N/A;  . LUMBAR LAMINECTOMY/DECOMPRESSION MICRODISCECTOMY  11/23/2012   Procedure: LUMBAR LAMINECTOMY/DECOMPRESSION MICRODISCECTOMY 1 LEVEL;  Surgeon: Floyce Stakes, MD;  [facility];  Service: Neurosurgery;  Laterality: Left;  Left Lumbar five-sacral one Diskectomy  . SKIN CANCER EXCISION     Under left eye basal cell  . SPINE SURGERY       Family History  Problem Relation Age of Onset  . Heart disease Mother        age 46  . Transient ischemic attack Mother   . Stroke Mother   . Stomach cancer Sister   . Cancer Sister        age 74  . Colon polyps Brother   . Heart disease Brother   . Thyroid cancer Brother   . Alzheimer's disease Father  45  . Colon cancer Neg Hx     Social History   Social History Narrative   Lives with Ronney Lion   Married 35 years   Involved with church and the [employer]    Social History   Tobacco Use  . Smoking status: Never Smoker  . Smokeless tobacco: Never Used  Substance Use Topics  . Alcohol use: No    Alcohol/week: 0.0 standard drinks    Current Meds  Medication Sig  . acetaminophen (TYLENOL) 500 MG tablet Take 500 mg by mouth every 6 (six) hours as needed.  Marland Kitchen CAREPOINT SAFETY1ST SYR/NEEDLE 23G X 1" 1 ML MISC USE AS DIRECTED.UC  . Cholecalciferol (VITAMIN D3 MAXIMUM STRENGTH) 125 MCG (5000 UT) capsule Take 5,000 Units by mouth in the morning and at bedtime. 10,000 units  . clotrimazole (LOTRIMIN AF) 1 % cream Apply 1 application topically 2 (two)  times daily.  . fexofenadine (ALLEGRA) 180 MG tablet Take 180 mg by mouth daily.  . NP THYROID 90 MG tablet TAKE 1 TABLET DAILY  . Psyllium (METAMUCIL FIBER PO) Take 5 mLs by mouth daily. Mix with water  . SAFETY-LOK 3CC SYR 22GX1.5" 22G X 1-1/2" 3 ML MISC   . Saw Palmetto, Serenoa repens, 320 MG CAPS Take 640 mg at bedtime by mouth.   . testosterone cypionate (DEPOTESTOSTERONE CYPIONATE) 200 MG/ML injection Inject 0.5 mLs (100 mg total) into the muscle once a week. 0.36ml weekly  . TUBERCULIN SYR 1CC/25GX5/8" 25G X 5/8" 1 ML MISC   . vitamin B-12 (CYANOCOBALAMIN) 1000 MCG tablet Take 1,000 mcg by mouth daily.      Depression screen [facility] 2/9 02/16/2020 02/17/2018 02/17/2018 11/19/2017  Decreased Interest 0 0 0 0  Down, Depressed, Hopeless 0 0 0 0  PHQ - 2 Score 0 0 0 0     Objective:   Today's Vitals: BP 122/70   Pulse 63   Temp (!) 97.1 F (36.2 C) (Temporal)   Ht 5\' 9"  (1.753 m)   Wt 176 lb (79.8 kg)   SpO2 97%   BMI 25.99 kg/m  Vitals with BMI 08/29/2020 04/25/2020 02/16/2020  Height 5\' 9"  5\' 9"  5' 8.5"  Weight 176 lbs 179 lbs 3 oz 176 lbs 13 oz  BMI 25.98 98.92 11.94  Systolic 174 081 448  Diastolic 70 60 70  Pulse 63 74 68     Physical Exam  He looks systemically well. Both ear canals seem to be somewhat inflamed, tympanic membrane is not inflamed.     Assessment   1. Acquired hypothyroidism   2. Decreased libido   3. Special screening for malignant neoplasm of prostate   4. Severe frontal headaches       Tests ordered Orders Placed This Encounter  Procedures  . PSA, Total with Reflex to PSA, Free  . Testosterone Total,Free,Bio, Males  . T3, free  . TSH     Plan: 1. He will continue with NP thyroid and we will check thyroid function today. 2. He will continue with testosterone therapy and we will check testosterone level and also PSA. 3. I recommended allergy medicine as well as Flonase nasal spray to see if his frontal headaches improve. If they do  not, he will let me know. 4. As far as his itchy ears are concerned, they are inflamed and I will prescribe Corticosporin eardrops to see if this will help him. If they do not, I may need to refer him to ENT. 5. Follow-up in 6 months.  Meds ordered this encounter  Medications  . NEOMYCIN-POLYMYXIN-HYDROCORTISONE (CORTISPORIN) 1 % SOLN OTIC solution    Sig: Place 3 drops into both ears 4 (four) times daily.    Dispense:  10 mL    Refill:  0    Dawn Kiper Luther Parody, MD

## 2020-08-30 ENCOUNTER — Encounter (INDEPENDENT_AMBULATORY_CARE_PROVIDER_SITE_OTHER): Payer: Self-pay | Admitting: Internal Medicine

## 2020-08-30 LAB — TESTOSTERONE TOTAL,FREE,BIO, MALES
Albumin: 4.7 g/dL (ref 3.6–5.1)
Sex Hormone Binding: 54 nmol/L (ref 22–77)
Testosterone, Bioavailable: 184.1 ng/dL — ABNORMAL HIGH (ref 15.0–150.0)
Testosterone, Free: 85.9 pg/mL — ABNORMAL HIGH (ref 6.0–73.0)
Testosterone: 899 ng/dL — ABNORMAL HIGH (ref 250–827)

## 2020-08-30 LAB — T3, FREE: T3, Free: 5.2 pg/mL — ABNORMAL HIGH (ref 2.3–4.2)

## 2020-08-30 LAB — PSA, TOTAL WITH REFLEX TO PSA, FREE: PSA, Total: 1.2 ng/mL (ref <=4.0)

## 2020-08-30 LAB — TSH: TSH: 2.27 mIU/L (ref 0.40–4.50)

## 2020-09-03 DIAGNOSIS — Z23 Encounter for immunization: Secondary | ICD-10-CM | POA: Diagnosis not present

## 2020-09-20 DIAGNOSIS — H25812 Combined forms of age-related cataract, left eye: Secondary | ICD-10-CM | POA: Diagnosis not present

## 2020-09-20 DIAGNOSIS — H35372 Puckering of macula, left eye: Secondary | ICD-10-CM | POA: Diagnosis not present

## 2020-09-20 DIAGNOSIS — H3554 Dystrophies primarily involving the retinal pigment epithelium: Secondary | ICD-10-CM | POA: Diagnosis not present

## 2020-10-18 ENCOUNTER — Ambulatory Visit (INDEPENDENT_AMBULATORY_CARE_PROVIDER_SITE_OTHER): Payer: Medicare Other | Admitting: Cardiology

## 2020-10-18 ENCOUNTER — Other Ambulatory Visit: Payer: Self-pay

## 2020-10-18 ENCOUNTER — Encounter: Payer: Self-pay | Admitting: Cardiology

## 2020-10-18 VITALS — BP 132/80 | HR 78 | Ht 69.0 in | Wt 181.0 lb

## 2020-10-18 DIAGNOSIS — I2510 Atherosclerotic heart disease of native coronary artery without angina pectoris: Secondary | ICD-10-CM | POA: Diagnosis not present

## 2020-10-18 DIAGNOSIS — R55 Syncope and collapse: Secondary | ICD-10-CM

## 2020-10-18 MED ORDER — ROSUVASTATIN CALCIUM 5 MG PO TABS: ORAL_TABLET | ORAL | 3 refills | Status: DC

## 2020-10-18 NOTE — Progress Notes (Signed)
Cardiology Office Note  Date: 10/18/2020   ID: Brad Cruz, DOB January 22, 1946, MRN [id number]  PCP:  Brad Albee, Cruz  Cardiologist:  Brad Lesches, Cruz Electrophysiologist:  None   Chief Complaint  Patient presents with  . Cardiac follow-up    History of Present Illness: Brad Cruz is a 74 y.o. male and in December 2020 by [name].  He presents for a routine visit.  He does not describe any obvious angina symptoms, able to walk up 16 steps at his home on a regular basis.  His stamina is not what it used to be.  Overall he feels reasonably well.  I reviewed his medications which are outlined below.  He does have a history of statin intolerance.  We discussed trying Crestor 5 mg once weekly.  He has nonobstructive coronary atherosclerosis by previous evaluation.  No syncopal events.  I personally reviewed his ECG today which shows normal sinus rhythm.  Past Medical History:  Diagnosis Date  . Allergy    Mold, cat dander  . Arthritis    cerv spine  . Asthma    Childhood history  . BPH (benign prostatic hyperplasia)   . Cataract   . Coronary atherosclerosis of native coronary artery    a. 06/2012 Myoview:  No ischemia;  b. 06/25/2012 Cath:  LM nl, LAD 50p, 29m, D1 80 ost (small), D2 nl (small), LCX 30 ost, OM1 nl, RI 30 ost, RCA 50-60- ost, 28m, PDA minor irregs, PL nl (small), EF 65%.   Marland Kitchen GERD (gastroesophageal reflux disease)   . Hyperlipidemia   . Hypothyroidism   . Migraines   . 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 Skin cancer, basal cell    BCC face  . Vasovagal syncope   . Vitamin D deficiency disease 08/09/2019    Past Surgical History:  Procedure Laterality Date  . BIOPSY  09/09/2017   Procedure: BIOPSY;  Surgeon: Brad Cruz;  [facility];  Service: Endoscopy;;  gastric  . CARDIAC CATHETERIZATION    . COLONOSCOPY    . Deviated septum repair 1989    . ESOPHAGOGASTRODUODENOSCOPY N/A 09/09/2017   Procedure:  ESOPHAGOGASTRODUODENOSCOPY (EGD);  Surgeon: Brad Cruz;  [facility];  Service: Endoscopy;  Laterality: N/A;  . LEFT HEART CATHETERIZATION WITH CORONARY ANGIOGRAM N/A 06/25/2012   Procedure: LEFT HEART CATHETERIZATION WITH CORONARY ANGIOGRAM;  Surgeon: Brad Cruz;  Location: [facility] CATH LAB;  Service: Cardiovascular;  Laterality: N/A;  . LUMBAR LAMINECTOMY/DECOMPRESSION MICRODISCECTOMY  11/23/2012   Procedure: LUMBAR LAMINECTOMY/DECOMPRESSION MICRODISCECTOMY 1 LEVEL;  Surgeon: Brad Cruz;  [facility];  Service: Neurosurgery;  Laterality: Left;  Left Lumbar five-sacral one Diskectomy  . SKIN CANCER EXCISION     Under left eye basal cell  . SPINE SURGERY      Current Outpatient Medications  Medication Sig Dispense Refill  . acetaminophen (TYLENOL) 500 MG tablet Take 500 mg by mouth every 6 (six) hours as needed.    Marland Kitchen CAREPOINT SAFETY1ST SYR/NEEDLE 23G X 1" 1 ML MISC USE AS DIRECTED.UC    . Cholecalciferol 125 MCG (5000 UT) capsule Take 5,000 Units by mouth in the morning and at bedtime. 10,000 units    . clotrimazole (LOTRIMIN) 1 % cream Apply 1 application topically 2 (two) times daily.    . fexofenadine (ALLEGRA) 180 MG tablet Take 180 mg by mouth daily.    . NEOMYCIN-POLYMYXIN-HYDROCORTISONE (CORTISPORIN) 1 %  SOLN OTIC solution Place 3 drops into both ears 4 (four) times daily. 10 mL 0  . Psyllium (METAMUCIL FIBER PO) Take 5 mLs by mouth daily. Mix with water    . SAFETY-LOK 3CC SYR 22GX1.5" 22G X 1-1/2" 3 ML MISC     . Saw Palmetto, Serenoa repens, 320 MG CAPS Take 640 mg at bedtime by mouth.     . testosterone cypionate (DEPOTESTOSTERONE CYPIONATE) 200 MG/ML injection Inject 0.5 mLs (100 mg total) into the muscle once a week. 0.43ml weekly 10 mL 2  . TUBERCULIN SYR 1CC/25GX5/8" 25G X 5/8" 1 ML MISC     . vitamin B-12 (CYANOCOBALAMIN) 1000 MCG tablet Take 1,000 mcg by mouth daily.    . Cruz THYROID 90 MG tablet TAKE 1 TABLET DAILY 90 tablet 1  .  rosuvastatin (CRESTOR) 5 MG tablet Take 5 mg once a week with dinner 30 tablet 3   No current facility-administered medications for this visit.   Allergies:  Citrus, Ibuprofen, Shellfish-derived products, and Statins   ROS: No orthopnea or PND.  Physical Exam: VS:  BP 132/80   Pulse 78   Ht 5\' 9"  (1.753 m)   Wt 181 lb (82.1 kg)   SpO2 97%   BMI 26.73 kg/m , BMI Body mass index is 26.73 kg/m.  Wt Readings from Last 3 Encounters:  10/18/20 181 lb (82.1 kg)  08/29/20 176 lb (79.8 kg)  04/25/20 179 lb 3.2 oz (81.3 kg)    General: Patient appears comfortable at rest. HEENT: Conjunctiva and lids normal, wearing a mask. Neck: Supple, no elevated JVP or carotid bruits, no thyromegaly. Lungs: Clear to auscultation, nonlabored breathing at rest. Cardiac: Regular rate and rhythm, no S3 or significant systolic murmur, no pericardial rub. Extremities: No pitting edema.  ECG:  An ECG dated 10/19/2019 was personally reviewed today and demonstrated:  Normal sinus rhythm.  Recent Labwork: 02/16/2020: ALT 15; AST 19; BUN 14; Creat 0.97; Potassium 4.7; Sodium 139 08/29/2020: TSH 2.27     Component Value Date/Time   CHOL 201 (H) 08/09/2019 1126   TRIG 88 08/09/2019 1126   HDL 38 (L) 08/09/2019 1126   CHOLHDL 5.3 (H) 08/09/2019 1126   LDLCALC 144 (H) 08/09/2019 1126    Other Studies Reviewed Today:  Echocardiogram 09/10/2017: - Left ventricle: The cavity size was normal. Wall thickness was  increased in a pattern of mild LVH. Systolic function was normal.  The estimated ejection fraction was in the range of 55% to 60%.  Wall motion was normal; there were no regional wall motion  abnormalities. Left ventricular diastolic function parameters  were normal.  - Mitral valve: There was mild regurgitation.  - Right atrium: Central venous pressure (est): 8 mm Hg.  - Tricuspid valve: There was trivial regurgitation.  - Pulmonary arteries: PA peak pressure: 33 mm Hg (S).  -  Pericardium, extracardiac: There was no pericardial effusion.   Impressions:   - Mild LVH with LVEF 55-60% and normal diastolic function. Mild  mitral regurgitation. Trivial tricuspid regurgitation with  estimated PASP 33 mmHg.   Assessment and Plan:  1.  Nonobstructive CAD by previous evaluation, no obvious angina described at this time and his ECG is normal.  He has history of statin intolerance, agreeable to trying Crestor 5 mg once weekly.  We might be able to uptitrate this gradually.  Continue with observation.  2.  History of vasovagal syncope.  He has no regular recurrences.  Medication Adjustments/Labs and Tests Ordered: Current medicines are reviewed  at length with the patient today.  Concerns regarding medicines are outlined above.   Tests Ordered: Orders Placed This Encounter  Procedures  . EKG 12-Lead    Medication Changes: Meds ordered this encounter  Medications  . rosuvastatin (CRESTOR) 5 MG tablet    Sig: Take 5 mg once a week with dinner    Dispense:  30 tablet    Refill:  3    Disposition:  Follow up 1 year in the [place] office.  Signed, Satira Sark, Cruz, [facility] 10/18/2020 11:59 AM    [facility] [place]. [address] [place], [place] [address] Phone: [phone]; Fax: [phone]

## 2020-10-18 NOTE — Patient Instructions (Signed)
Medication Instructions:  Take Crestor 5 mg once a week with dinner  *If you need a refill on your cardiac medications before your next appointment, please call your pharmacy*   Lab Work: None today If you have labs (blood work) drawn today and your tests are completely normal, you will receive your results only by: Marland Kitchen MyChart Message (if you have MyChart) OR . A paper copy in the mail If you have any lab test that is abnormal or we need to change your treatment, we will call you to review the results.   Testing/Procedures: None today   Follow-Up: At [facility], you and your health needs are our priority.  As part of our continuing mission to provide you with exceptional heart care, we have created designated Provider Care Teams.  These Care Teams include your primary Cardiologist (physician) and Advanced Practice Providers (APPs -  Physician Assistants and Nurse Practitioners) who all work together to provide you with the care you need, when you need it.  We recommend signing up for the patient portal called "MyChart".  Sign up information is provided on this After Visit Summary.  MyChart is used to connect with patients for Virtual Visits (Telemedicine).  Patients are able to view lab/test results, encounter notes, upcoming appointments, etc.  Non-urgent messages can be sent to your provider as well.   To learn more about what you can do with MyChart, go to [email or web address].    Your next appointment:   12 month(s)  The format for your next appointment:   In Person  Provider:   Rozann Lesches, MD   Other Instructions None    Thank you for choosing [facility] !

## 2020-11-22 ENCOUNTER — Other Ambulatory Visit (INDEPENDENT_AMBULATORY_CARE_PROVIDER_SITE_OTHER): Payer: Self-pay | Admitting: Internal Medicine

## 2020-11-22 MED ORDER — TESTOSTERONE CYPIONATE 200 MG/ML IM SOLN
100.0000 mg | INTRAMUSCULAR | 2 refills | Status: DC
Start: 2020-11-22 — End: 2021-11-20

## 2020-11-24 ENCOUNTER — Encounter (INDEPENDENT_AMBULATORY_CARE_PROVIDER_SITE_OTHER): Payer: Self-pay | Admitting: Internal Medicine

## 2020-11-26 ENCOUNTER — Other Ambulatory Visit (INDEPENDENT_AMBULATORY_CARE_PROVIDER_SITE_OTHER): Payer: Self-pay | Admitting: Internal Medicine

## 2020-11-26 ENCOUNTER — Telehealth (INDEPENDENT_AMBULATORY_CARE_PROVIDER_SITE_OTHER): Payer: Self-pay

## 2020-11-26 MED ORDER — ROSUVASTATIN CALCIUM 5 MG PO TABS: ORAL_TABLET | ORAL | 1 refills | Status: DC

## 2020-11-26 MED ORDER — ROSUVASTATIN CALCIUM 5 MG PO TABS
ORAL_TABLET | ORAL | 1 refills | Status: DC
Start: 2020-11-26 — End: 2020-11-26

## 2020-11-26 NOTE — Telephone Encounter (Signed)
Yes, I got an email saying that we had problems with electronic prescriptions but now it seems to have been resolved so I am sending it again.

## 2020-11-26 NOTE — Telephone Encounter (Signed)
[facility] called an left a voice message that there was a problem with the following medication that was sent in today:  rosuvastatin (CRESTOR) 5 MG tablet  Sent today, # 90 with 1 refill  Please contact them at [phone]  [facility] # [id number]

## 2020-11-28 ENCOUNTER — Encounter (INDEPENDENT_AMBULATORY_CARE_PROVIDER_SITE_OTHER): Payer: Self-pay | Admitting: Internal Medicine

## 2020-11-29 ENCOUNTER — Other Ambulatory Visit (INDEPENDENT_AMBULATORY_CARE_PROVIDER_SITE_OTHER): Payer: Self-pay | Admitting: Internal Medicine

## 2020-11-29 ENCOUNTER — Other Ambulatory Visit (INDEPENDENT_AMBULATORY_CARE_PROVIDER_SITE_OTHER): Payer: Self-pay

## 2020-11-29 MED ORDER — ROSUVASTATIN CALCIUM 5 MG PO TABS: ORAL_TABLET | ORAL | 1 refills | Status: DC

## 2020-11-29 MED ORDER — ROSUVASTATIN CALCIUM 5 MG PO TABS: 5.0000 mg | ORAL_TABLET | ORAL | 0 refills | Status: DC

## 2020-12-03 ENCOUNTER — Other Ambulatory Visit (INDEPENDENT_AMBULATORY_CARE_PROVIDER_SITE_OTHER): Payer: Self-pay | Admitting: Internal Medicine

## 2020-12-03 ENCOUNTER — Telehealth (INDEPENDENT_AMBULATORY_CARE_PROVIDER_SITE_OTHER): Payer: Self-pay

## 2020-12-03 MED ORDER — ROSUVASTATIN CALCIUM 5 MG PO TABS: 5.0000 mg | ORAL_TABLET | ORAL | 3 refills | Status: DC

## 2020-12-03 NOTE — Telephone Encounter (Signed)
Received 2 faxes from [place] for the following medication:  rosuvastatin (CRESTOR) 5 MG tablet   [facility] states that the prescribed dosage of 1 tablet per week is not the recommended dosing guidelines. They are wanting you to clarify the dosage.  Paperwork on my desk if you need to see it.

## 2020-12-05 ENCOUNTER — Other Ambulatory Visit (INDEPENDENT_AMBULATORY_CARE_PROVIDER_SITE_OTHER): Payer: Self-pay | Admitting: Internal Medicine

## 2020-12-05 ENCOUNTER — Telehealth (INDEPENDENT_AMBULATORY_CARE_PROVIDER_SITE_OTHER): Payer: Self-pay

## 2020-12-05 MED ORDER — ROSUVASTATIN CALCIUM 5 MG PO TABS: 5.0000 mg | ORAL_TABLET | ORAL | 1 refills | Status: DC

## 2020-12-05 NOTE — Telephone Encounter (Signed)
[facility] pharmacy called and requested doctor to call and speak to pharmacist within 24 hours for the directions of the following medication: rosuvastatin (CRESTOR) 5 MG tablet   Call [phone] Reference Invoice number #[id number]

## 2021-01-15 IMAGING — MR MR LUMBAR SPINE W/O CM
4 of 5 series · 15 of 48 positions shown · non-contrast
Comparison: Lumbar spine MRI 02/16/2013

CLINICAL DATA: Low back pain radiating to the left lower extremity

EXAM:
MRI LUMBAR SPINE WITHOUT CONTRAST
TECHNIQUE: Multiplanar, multisequence MR imaging of the lumbar spine was
performed. No intravenous contrast was administered.

[Series 3: T2 · sagittal · 4.0mm · 0.67mm/px · 6 of 17 slices shown (1 of 2)]
[im 1/17]
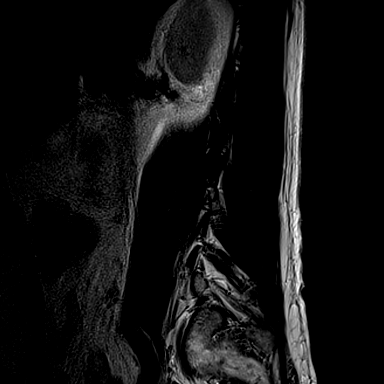
[im 4/17]
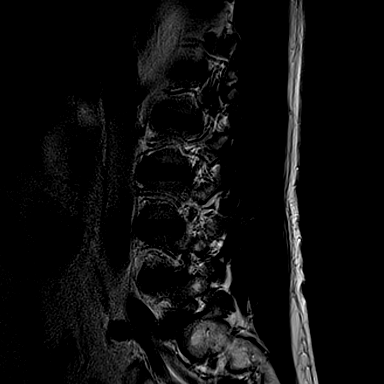
[im 7/17]
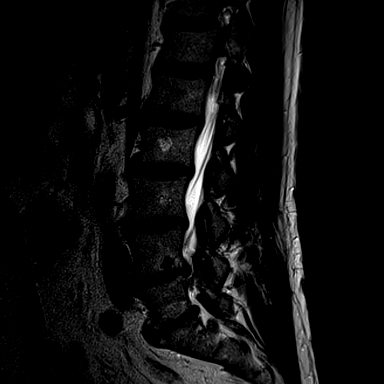
[im 10/17]
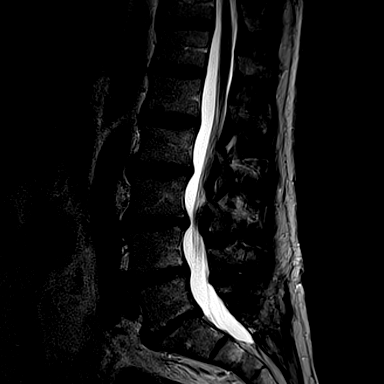
[im 13/17]
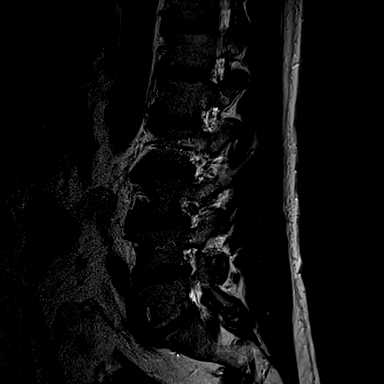
[im 17/17]
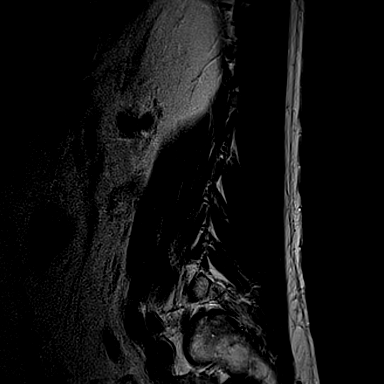

[Series 4: T1 · sagittal · 4.0mm · 0.33mm/px · 3 of 17 slices shown (1 of 2)]
[im 4/17]
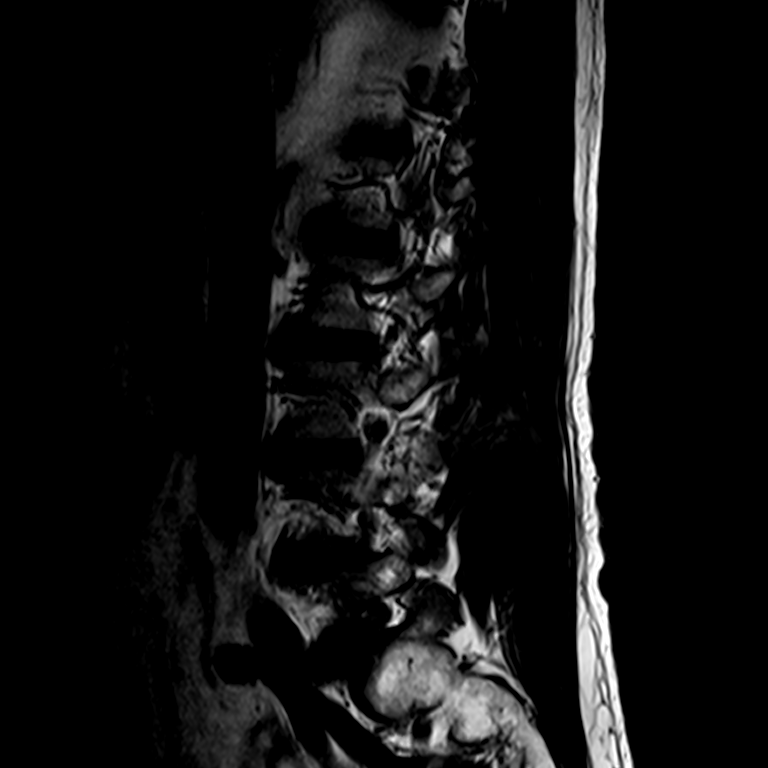
[im 10/17]
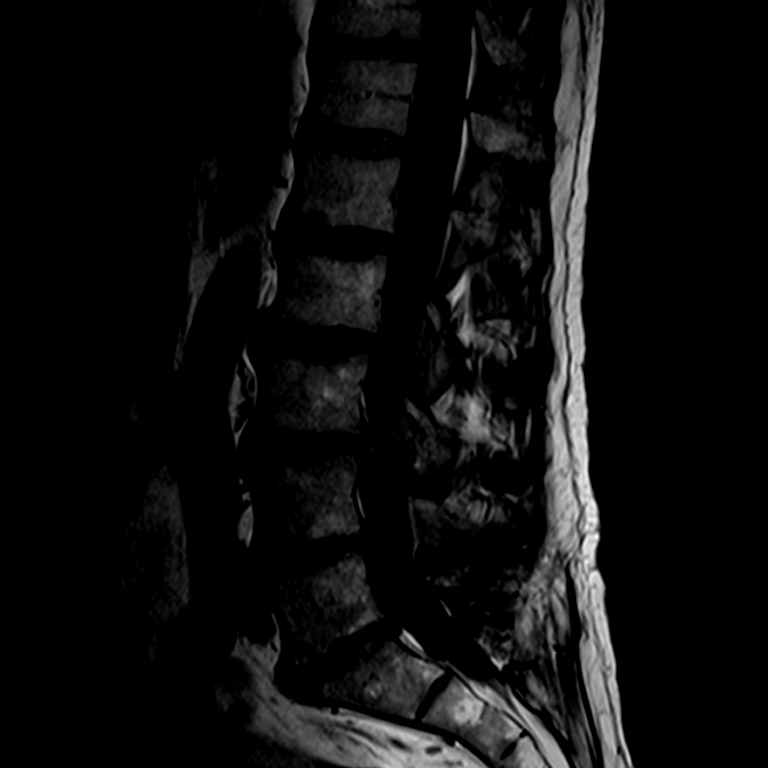
[im 17/17]
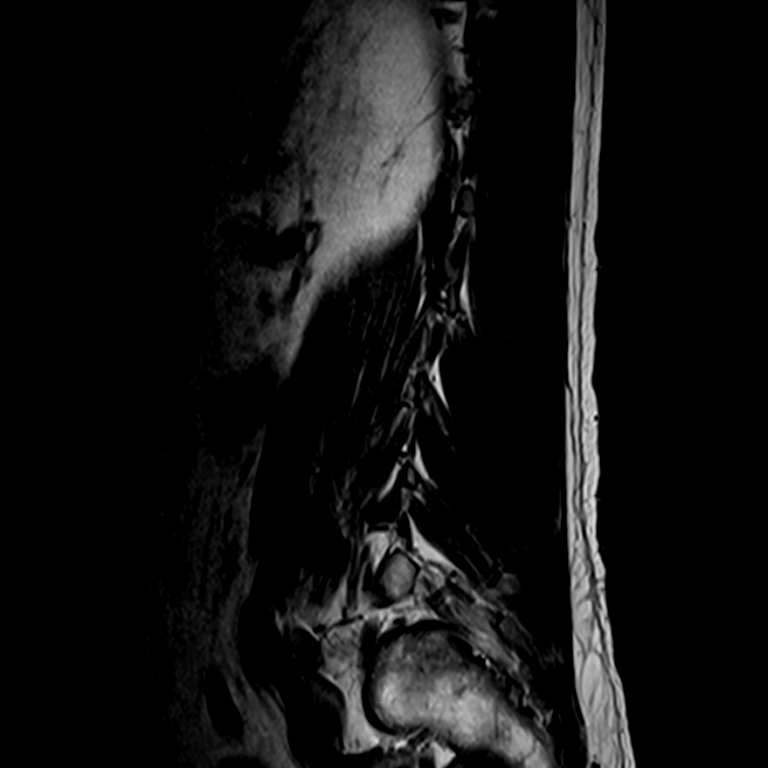

[Series 6: T2 · axial · 4.0mm · 0.24mm/px · z∈[-35,+106]mm · 3 of 40 slices shown (2 of 2)]
[im 6/40]
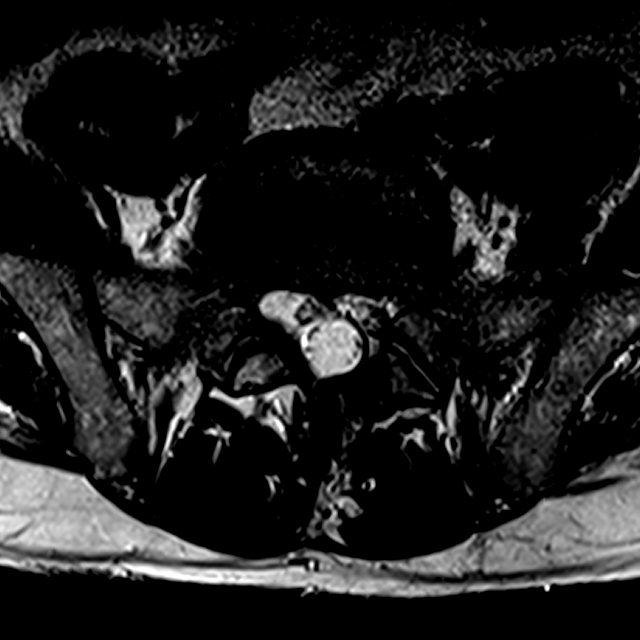
[im 20/40]
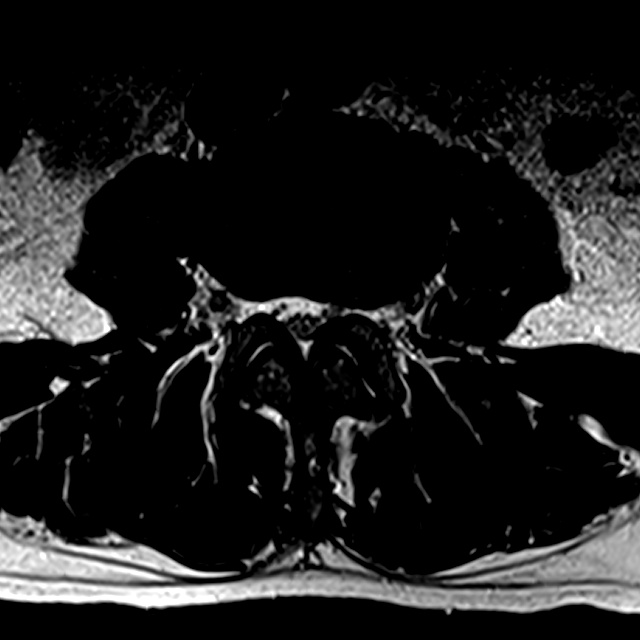
[im 34/40]
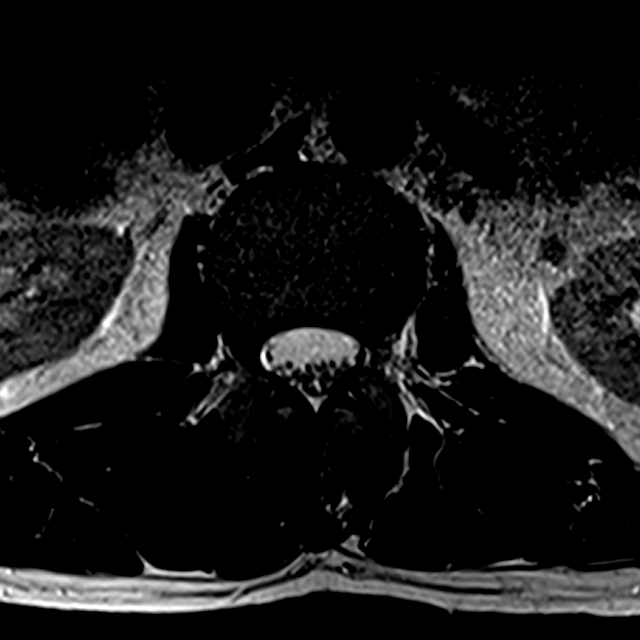

[Series 7: T1 · axial · 4.0mm · 0.24mm/px · z∈[-35,+106]mm · 3 of 40 slices shown (2 of 2)]
[im 6/40]
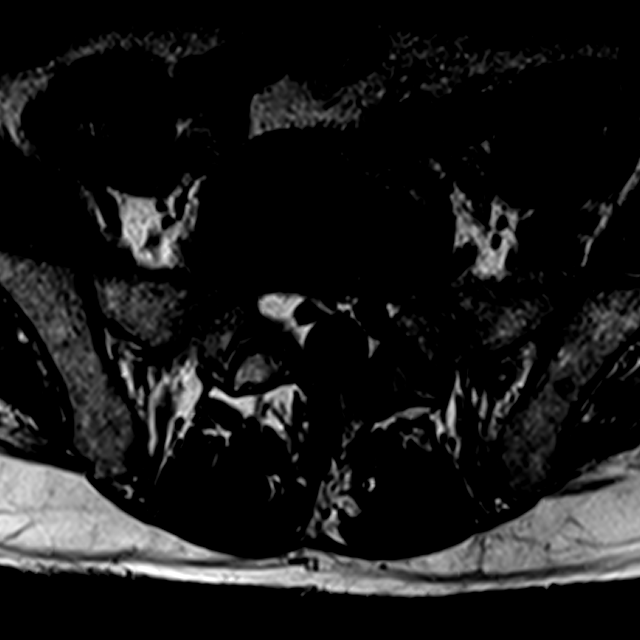
[im 20/40]
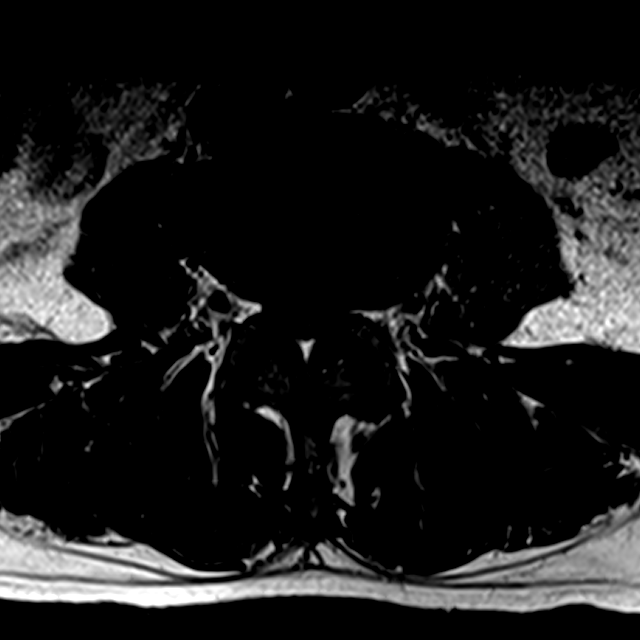
[im 34/40]
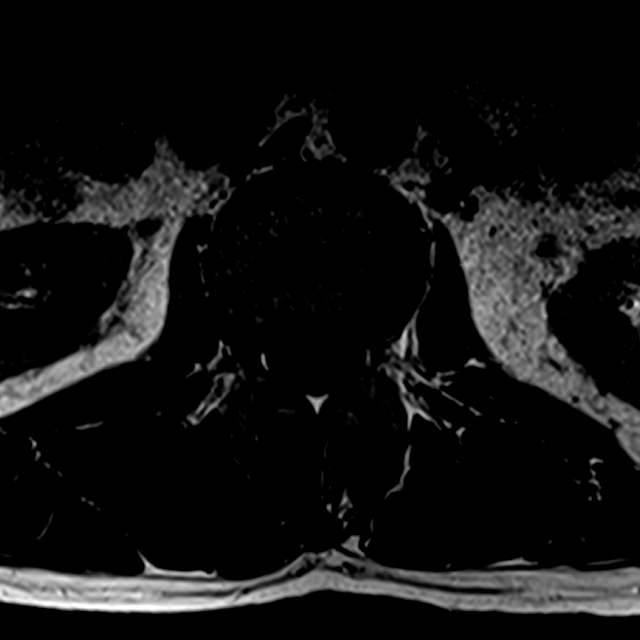

[15 of 48 positions shown; findings below may reference images not displayed]

FINDINGS: Segmentation: Normal. The lowest disc space is considered to be
L5-S1.

Alignment:  Normal

Vertebrae: Hemangiomata at L2 and L3. Inferior L4 and L1 endplate
Schmorl's nodes.

Conus medullaris and cauda equina: The conus medullaris terminates
at the L1 level. The cauda equina and conus medullaris are both
normal.

Paraspinal and other soft tissues: The visualized retroperitoneal
organs and paraspinal soft tissues are normal.

Disc levels: Sagittal plane imaging includes the T11-12 disc level
through the upper sacrum, with axial imaging of the L1-2 to L5-S1
disc levels.

T11-L2: Normal.

L2-3: Disc desiccation with mild bulge. No spinal canal or neural
foraminal stenosis.

L3-4: Disc desiccation and left eccentric bulge with left
subarticular annular fissure new compared to the prior study. Mild
narrowing of the left lateral recess. No central spinal canal
stenosis. Mild left neural foraminal stenosis.

L4-5: Disc desiccation and mild disc bulge. No spinal canal
stenosis. Moderate right neural foraminal stenosis, worsened from
the prior study.

L5-S1: Left hemilaminectomy. Disc space narrowing with mild bulge.
No central spinal canal stenosis. Moderate bilateral neural
foraminal stenosis.

The visualized portion of the sacrum is normal.
IMPRESSION: 1. Progression of now moderate right neural foraminal stenosis at
L4-5.
2. Left L3-4 subarticular disc protrusion with annular fissure,
worsened the prior study with mild narrowing of the left lateral
recess and left neural foramen.
3. Remote left L5 hemilaminectomy without spinal canal stenosis.
Moderate bilateral neural foraminal stenosis is unchanged.

## 2021-02-22 ENCOUNTER — Ambulatory Visit (INDEPENDENT_AMBULATORY_CARE_PROVIDER_SITE_OTHER): Payer: Medicare Other | Admitting: Nurse Practitioner

## 2021-02-25 ENCOUNTER — Telehealth (INDEPENDENT_AMBULATORY_CARE_PROVIDER_SITE_OTHER): Payer: Self-pay

## 2021-02-26 ENCOUNTER — Ambulatory Visit (INDEPENDENT_AMBULATORY_CARE_PROVIDER_SITE_OTHER): Payer: Medicare Other | Admitting: Internal Medicine

## 2021-02-28 ENCOUNTER — Other Ambulatory Visit: Payer: Self-pay

## 2021-02-28 ENCOUNTER — Ambulatory Visit (INDEPENDENT_AMBULATORY_CARE_PROVIDER_SITE_OTHER): Payer: Medicare Other | Admitting: Nurse Practitioner

## 2021-02-28 ENCOUNTER — Ambulatory Visit (INDEPENDENT_AMBULATORY_CARE_PROVIDER_SITE_OTHER): Payer: Medicare Other | Admitting: Internal Medicine

## 2021-02-28 ENCOUNTER — Encounter (INDEPENDENT_AMBULATORY_CARE_PROVIDER_SITE_OTHER): Payer: Self-pay | Admitting: Internal Medicine

## 2021-02-28 VITALS — BP 119/72 | HR 72 | Temp 97.5°F | Resp 18 | Ht 69.0 in | Wt 178.2 lb

## 2021-02-28 VITALS — BP 119/72 | HR 72 | Temp 97.5°F | Ht 69.0 in | Wt 178.0 lb

## 2021-02-28 DIAGNOSIS — R6882 Decreased libido: Secondary | ICD-10-CM | POA: Diagnosis not present

## 2021-02-28 DIAGNOSIS — Z0000 Encounter for general adult medical examination without abnormal findings: Secondary | ICD-10-CM | POA: Diagnosis not present

## 2021-02-28 DIAGNOSIS — H9201 Otalgia, right ear: Secondary | ICD-10-CM

## 2021-02-28 DIAGNOSIS — E782 Mixed hyperlipidemia: Secondary | ICD-10-CM | POA: Diagnosis not present

## 2021-02-28 DIAGNOSIS — Z23 Encounter for immunization: Secondary | ICD-10-CM | POA: Diagnosis not present

## 2021-02-28 NOTE — Progress Notes (Signed)
Metrics: Intervention Frequency ACO  Documented Smoking Status Yearly  Screened one or more times in 24 months  Cessation Counseling or  Active cessation medication Past 24 months  Past 24 months   Guideline developer: UpToDate (See UpToDate for funding source) Date Released: 2014       Wellness Office Visit  Subjective:  Patient ID: Brad Cruz, male    DOB: 1946/01/09  Age: 75 y.o. MRN: [id number]  CC: This man comes in for follow-up of dyslipidemia, testosterone therapy. HPI  Testosterone therapy has improved his libido. He takes Crestor 5 mg usually 2-3 times a week. He has seen his cardiologist fairly recently in December. He describes discomfort in both ears more on the right than the left. He is also complaining of a skin lesion on the right forearm and he will be seeing see his dermatologist regarding this. Past Medical History:  Diagnosis Date  . Allergy    Mold, cat dander  . Arthritis    cerv spine  . Asthma    Childhood history  . BPH (benign prostatic hyperplasia)   . Cataract   . Coronary atherosclerosis of native coronary artery    a. 06/2012 Myoview:  No ischemia;  b. 06/25/2012 Cath:  LM nl, LAD 50p, 53m, D1 80 ost (small), D2 nl (small), LCX 30 ost, OM1 nl, RI 30 ost, RCA 50-60- ost, 61m, PDA minor irregs, PL nl (small), EF 65%.   Marland Kitchen GERD (gastroesophageal reflux disease)   . Hyperlipidemia   . Hypothyroidism   . Migraines   . 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 Skin cancer, basal cell    BCC face  . Vasovagal syncope   . Vitamin D deficiency disease 08/09/2019   Past Surgical History:  Procedure Laterality Date  . BIOPSY  09/09/2017   Procedure: BIOPSY;  Surgeon: Rogene Houston, MD;  Location: [facility];  Service: Endoscopy;;  gastric  . CARDIAC CATHETERIZATION    . COLONOSCOPY    . Deviated septum repair 1989    . ESOPHAGOGASTRODUODENOSCOPY N/A 09/09/2017   Procedure: ESOPHAGOGASTRODUODENOSCOPY (EGD);  Surgeon: Rogene Houston,  MD;  Location: [facility];  Service: Endoscopy;  Laterality: N/A;  . LEFT HEART CATHETERIZATION WITH CORONARY ANGIOGRAM N/A 06/25/2012   Procedure: LEFT HEART CATHETERIZATION WITH CORONARY ANGIOGRAM;  Surgeon: Minus Breeding, MD;  [facility] C[facility] LAB;  Service: Cardiovascular;  Laterality: N/A;  . LUMBAR LAMINECTOMY/DECOMPRESSION MICRODISCECTOMY  11/23/2012   Procedure: LUMBAR LAMINECTOMY/DECOMPRESSION MICRODISCECTOMY 1 LEVEL;  Surgeon: Floyce Stakes, MD;  Location: [facility];  Service: Neurosurgery;  Laterality: Left;  Left Lumbar five-sacral one Diskectomy  . SKIN CANCER EXCISION     Under left eye basal cell  . SPINE SURGERY       Family History  Problem Relation Age of Onset  . Heart disease Mother        age 66  . Transient ischemic attack Mother   . Stroke Mother   . Stomach cancer Sister   . Cancer Sister        age 58  . Colon polyps Brother   . Heart disease Brother   . Thyroid cancer Brother   . Alzheimer's disease Father        22  . Colon cancer Neg Hx     Social History   Social History Narrative   Lives with Ronney Lion   Married 35 years   Involved with church and the [place]  History   Tobacco Use  . Smoking status: Never Smoker  . Smokeless tobacco: Never Used  Substance Use Topics  . Alcohol use: No    Alcohol/week: 0.0 standard drinks    Current Meds  Medication Sig  . acetaminophen (TYLENOL) 500 MG tablet Take 500 mg by mouth every 6 (six) hours as needed.  Marland Kitchen CAREPOINT SAFETY1ST SYR/NEEDLE 23G X 1" 1 ML MISC USE AS DIRECTED.UC  . Cholecalciferol 125 MCG (5000 UT) capsule Take 10,000 Units by mouth daily.  . clotrimazole (LOTRIMIN) 1 % cream Apply 1 application topically 2 (two) times daily.  . fexofenadine (ALLEGRA) 180 MG tablet Take 180 mg by mouth daily.  . NEOMYCIN-POLYMYXIN-HYDROCORTISONE (CORTISPORIN) 1 % SOLN OTIC solution Place 3 drops into both ears 4 (four) times daily.  . Psyllium (METAMUCIL FIBER PO) Take 5 mLs by mouth  daily. Mix with water  . rosuvastatin (CRESTOR) 5 MG tablet Take 1 tablet (5 mg total) by mouth 2 (two) times a week.  Marland Kitchen SAFETY-LOK 3CC SYR 22GX1.5" 22G X 1-1/2" 3 ML MISC   . Saw Palmetto, Serenoa repens, 320 MG CAPS Take 640 mg at bedtime by mouth.   . testosterone cypionate (DEPOTESTOSTERONE CYPIONATE) 200 MG/ML injection Inject 0.5 mLs (100 mg total) into the muscle once a week. 0.20ml weekly  . TUBERCULIN SYR 1CC/25GX5/8" 25G X 5/8" 1 ML MISC   . vitamin B-12 (CYANOCOBALAMIN) 1000 MCG tablet Take 1,000 mcg by mouth daily.       Objective:   Today's Vitals: BP 119/72 (BP Location: Left Arm, Patient Position: Sitting, Cuff Size: Normal)   Pulse 72   Temp (!) 97.5 F (36.4 C) (Temporal)   Resp 18   Ht 5\' 9"  (1.753 m)   Wt 178 lb 3.2 oz (80.8 kg)   SpO2 98%   BMI 26.32 kg/m  Vitals with BMI 02/28/2021 10/18/2020 08/29/2020  Height 5\' 9"  5\' 9"  5\' 9"   Weight 178 lbs 3 oz 181 lbs 176 lbs  BMI 26.3 14.97 02.63  Systolic 785 885 027  Diastolic 72 80 70  Pulse 72 78 63     Physical Exam  He looks systemically well.  Examination of both ear canals does seem to show an opaqueness to the right tympanic membrane compared to the left side.  I am not sure what this represents.     Assessment   1. Decreased libido   2. Mixed hyperlipidemia   3. Discomfort of right ear       Tests ordered Orders Placed This Encounter  Procedures  . Lipid panel  . Ambulatory referral to ENT     Plan: 1. Continue with testosterone therapy as before. 2. Continue with Crestor 2-3 times a week and we will check a lipid panel today. 3. I will refer him to Dr. Benjamine Mola, ENT for the abnormality seen in the right ear/tympanic membrane. 4. Follow-up in 6 months.   No orders of the defined types were placed in this encounter.   Doree Albee, MD

## 2021-02-28 NOTE — Patient Instructions (Signed)
[name] , Thank you for taking time to come for your Medicare Wellness Visit. I appreciate your ongoing commitment to your health goals. Please review the following plan we discussed and let me know if I can assist you in the future.   These are the goals we discussed: Goals   None     This is a list of the screening recommended for you and due dates:  Health Maintenance  Topic Date Due  . COVID-19 Vaccine (4 - Booster for Moderna series) 03/03/2021  . Flu Shot  06/03/2021  . Colon Cancer Screening  03/21/2024  . Tetanus Vaccine  02/15/2030  .  Hepatitis C: One time screening is recommended by Center for Disease Control  (CDC) for  adults born from [birth year] through 1965.   Completed  . Pneumonia vaccines  Completed  . HPV Vaccine  Aged Out     Pneumococcal Polysaccharide Vaccine (PPSV23): What You Need to Know 1. Why get vaccinated? Pneumococcal polysaccharide vaccine (PPSV23) can prevent pneumococcal disease. Pneumococcal disease refers to any illness caused by pneumococcal bacteria. These bacteria can cause many types of illnesses, including pneumonia, which is an infection of the lungs. Pneumococcal bacteria are one of the most common causes of pneumonia. Besides pneumonia, pneumococcal bacteria can also cause:  Ear infections  Sinus infections  Meningitis (infection of the tissue covering the brain and spinal cord)  Bacteremia (bloodstream infection) Anyone can get pneumococcal disease, but children under 58 years of age, people with certain medical conditions, adults 21 years or older, and cigarette smokers are at the highest risk. Most pneumococcal infections are mild. However, some can result in long-term problems, such as brain damage or hearing loss. Meningitis, bacteremia, and pneumonia caused by pneumococcal disease can be fatal. 2. PPSV23 PPSV23 protects against 23 types of bacteria that cause pneumococcal disease. PPSV23 is recommended for:  All adults 54 years or  older,  Anyone 2 years or older with certain medical conditions that can lead to an increased risk for pneumococcal disease. Most people need only one dose of PPSV23. A second dose of PPSV23, and another type of pneumococcal vaccine called PCV13, are recommended for certain high-risk groups. Your health care provider can give you more information. People 65 years or older should get a dose of PPSV23 even if they have already gotten one or more doses of the vaccine before they turned 54. 3. Talk with your health care provider Tell your vaccine provider if the person getting the vaccine:  Has had an allergic reaction after a previous dose of PPSV23, or has any severe, life-threatening allergies. In some cases, your health care provider may decide to postpone PPSV23 vaccination to a future visit. People with minor illnesses, such as a cold, may be vaccinated. People who are moderately or severely ill should usually wait until they recover before getting PPSV23. Your health care provider can give you more information. 4. Risks of a vaccine reaction  Redness or pain where the shot is given, feeling tired, fever, or muscle aches can happen after PPSV23. People sometimes faint after medical procedures, including vaccination. Tell your provider if you feel dizzy or have vision changes or ringing in the ears. As with any medicine, there is a very remote chance of a vaccine causing a severe allergic reaction, other serious injury, or death. 5. What if there is a serious problem? An allergic reaction could occur after the vaccinated person leaves the clinic. If you see signs of a severe allergic reaction (  hives, swelling of the face and throat, difficulty breathing, a fast heartbeat, dizziness, or weakness), call 9-1-1 and get the person to the nearest hospital. For other signs that concern you, call your health care provider. Adverse reactions should be reported to the Vaccine Adverse Event Reporting  System (VAERS). Your health care provider will usually file this report, or you can do it yourself. Visit the VAERS website at www.vaers.SamedayNews.es or call 820-697-9855. VAERS is only for reporting reactions, and VAERS staff do not give medical advice. 6. How can I learn more?  Ask your health care provider.  Call your local or state health department.  Contact the Centers for Disease Control and Prevention (CDC): ? Call 605-518-5315 (1-800-CDC-INFO) or ? Visit CDC's website at http://hunter.com/ Vaccine Information Statement PPSV23 Vaccine (09/01/2018) This information is not intended to replace advice given to you by your health care provider. Make sure you discuss any questions you have with your health care provider. Document Revised: 06/22/2020 Document Reviewed: 06/22/2020 Elsevier Patient Education  [place].

## 2021-02-28 NOTE — Progress Notes (Signed)
Subjective:   Brad Cruz is a 75 y.o. male who presents for Medicare Annual/Subsequent preventive examination.  Review of Systems     Cardiac Risk Factors include: advanced age (>56men, >41 women);dyslipidemia;male gender     Objective:    Today's Vitals   02/28/21 0932  BP: 119/72  Pulse: 72  Temp: (!) 97.5 F (36.4 C)  SpO2: 98%  Weight: 178 lb (80.7 kg)  Height: 5\' 9"  (1.753 m)   Body mass index is 26.29 kg/m.  Advanced Directives 02/28/2021 02/16/2020 09/09/2017 09/09/2017 09/09/2017 03/21/2014 11/23/2012  Does Patient Have a Medical Advance Directive? Yes Yes Yes No Yes Patient has advance directive, copy not in chart Patient has advance directive, copy not in chart  Type of Advance Directive [place];Living will - [place];Living will - [place];Living will - Other (Comment)  Does patient want to make changes to medical advance directive? No - Patient declined Yes (MAU/Ambulatory/Procedural Areas - Information given) No - Patient declined - - - -  Copy of [place] in Chart? - Yes - validated most recent copy scanned in chart (See row information) No - copy requested - No - copy requested - -  Would patient like information on creating a medical advance directive? - - - - - - Other (Comment)  Pre-existing out of facility DNR order (yellow form or pink MOST form) - - - - - - No    Current Medications (verified) Outpatient Encounter Medications as of 02/28/2021  Medication Sig  . acetaminophen (TYLENOL) 500 MG tablet Take 500 mg by mouth every 6 (six) hours as needed.  Marland Kitchen CAREPOINT SAFETY1ST SYR/NEEDLE 23G X 1" 1 ML MISC USE AS DIRECTED.UC  . Cholecalciferol 125 MCG (5000 UT) capsule Take 10,000 Units by mouth daily.  . clotrimazole (LOTRIMIN) 1 % cream Apply 1 application topically 2 (two) times daily.  . fexofenadine (ALLEGRA) 180 MG tablet Take 180 mg by mouth daily.  .  NEOMYCIN-POLYMYXIN-HYDROCORTISONE (CORTISPORIN) 1 % SOLN OTIC solution Place 3 drops into both ears 4 (four) times daily.  . Psyllium (METAMUCIL FIBER PO) Take 5 mLs by mouth daily. Mix with water  . rosuvastatin (CRESTOR) 5 MG tablet Take 1 tablet (5 mg total) by mouth 2 (two) times a week.  Marland Kitchen SAFETY-LOK 3CC SYR 22GX1.5" 22G X 1-1/2" 3 ML MISC   . Saw Palmetto, Serenoa repens, 320 MG CAPS Take 640 mg at bedtime by mouth.   . testosterone cypionate (DEPOTESTOSTERONE CYPIONATE) 200 MG/ML injection Inject 0.5 mLs (100 mg total) into the muscle once a week. 0.29ml weekly  . TUBERCULIN SYR 1CC/25GX5/8" 25G X 5/8" 1 ML MISC   . vitamin B-12 (CYANOCOBALAMIN) 1000 MCG tablet Take 1,000 mcg by mouth daily.   No facility-administered encounter medications on file as of 02/28/2021.    Allergies (verified) Citrus, Ibuprofen, Shellfish-derived products, and Statins   History: Past Medical History:  Diagnosis Date  . Allergy    Mold, cat dander  . Arthritis    cerv spine  . Asthma    Childhood history  . BPH (benign prostatic hyperplasia)   . Cataract   . Coronary atherosclerosis of native coronary artery    a. 06/2012 Myoview:  No ischemia;  b. 06/25/2012 Cath:  LM nl, LAD 50p, 63m, D1 80 ost (small), D2 nl (small), LCX 30 ost, OM1 nl, RI 30 ost, RCA 50-60- ost, 38m, PDA minor irregs, PL nl (small), EF 65%.   Marland Kitchen GERD (  gastroesophageal reflux disease)   . Hyperlipidemia   . Hypothyroidism   . Migraines   . 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 Skin cancer, basal cell    BCC face  . Vasovagal syncope   . Vitamin D deficiency disease 08/09/2019   Past Surgical History:  Procedure Laterality Date  . BIOPSY  09/09/2017   Procedure: BIOPSY;  Surgeon: Rogene Houston, MD;  [facility];  Service: Endoscopy;;  gastric  . CARDIAC CATHETERIZATION    . COLONOSCOPY    . Deviated septum repair 1989    . ESOPHAGOGASTRODUODENOSCOPY N/A 09/09/2017   Procedure:  ESOPHAGOGASTRODUODENOSCOPY (EGD);  Surgeon: Rogene Houston, MD;  [facility];  Service: Endoscopy;  Laterality: N/A;  . LEFT HEART CATHETERIZATION WITH CORONARY ANGIOGRAM N/A 06/25/2012   Procedure: LEFT HEART CATHETERIZATION WITH CORONARY ANGIOGRAM;  Surgeon: Minus Breeding, MD;  Location: [facility] CATH LAB;  Service: Cardiovascular;  Laterality: N/A;  . LUMBAR LAMINECTOMY/DECOMPRESSION MICRODISCECTOMY  11/23/2012   Procedure: LUMBAR LAMINECTOMY/DECOMPRESSION MICRODISCECTOMY 1 LEVEL;  Surgeon: Floyce Stakes, MD;  Location: [facility];  Service: Neurosurgery;  Laterality: Left;  Left Lumbar five-sacral one Diskectomy  . SKIN CANCER EXCISION     Under left eye basal cell  . SPINE SURGERY     Family History  Problem Relation Age of Onset  . Heart disease Mother        age 34  . Transient ischemic attack Mother   . Stroke Mother   . Stomach cancer Sister   . Cancer Sister        age 43  . Colon polyps Brother   . Heart disease Brother   . Thyroid cancer Brother   . Alzheimer's disease Father        6  . Colon cancer Neg Hx    Social History   Socioeconomic History  . Marital status: Married    Spouse name: bettie  . Number of children: 1  . Years of education: 60  . Highest education level: Professional school degree (e.g., MD, DDS, DVM, JD)  Occupational History  . Occupation: Therapist, art x 5 years    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  . Occupation: attorney  retired  Tobacco Use  . Smoking status: Never Smoker  . Smokeless tobacco: Never Used  Vaping Use  . Vaping Use: Never used  Substance and Sexual Activity  . Alcohol use: No    Alcohol/week: 0.0 standard drinks  . Drug use: No  . Sexual activity: Yes    Partners: Female    Birth control/protection: Post-menopausal  Other Topics Concern  . Not on file  Social History Narrative   Lives with Ronney Lion   Married 35 years   Involved with church and the [employer]    Social Determinants of Health   Financial Resource  Strain: Not on file  Food Insecurity: Not on file  Transportation Needs: Not on file  Physical Activity: Not on file  Stress: Not on file  Social Connections: Not on file    Tobacco Counseling Counseling given: Not Answered   Clinical Intake:  Pre-visit preparation completed: No  Pain : No/denies pain     BMI - recorded: 26.29 Nutritional Status: BMI 25 -29 Overweight Nutritional Risks: None Diabetes: No  How often do you need to have someone help you when you read instructions, pamphlets, or other written materials from your doctor or pharmacy?: 1 - Never What is the last grade level you completed  in school?: J.D. (11 years of school)  Diabetic? No  Interpreter Needed?: No  Information entered by :: Jeralyn Ruths, NP-C   Activities of Daily Living In your present state of health, do you have any difficulty performing the following activities: 02/28/2021 02/28/2021  Hearing? N N  Vision? Y Y  Difficulty concentrating or making decisions? N N  Walking or climbing stairs? N N  Dressing or bathing? N N  Doing errands, shopping? N N  Preparing Food and eating ? N -  Using the Toilet? N -  In the past six months, have you accidently leaked urine? N -  Do you have problems with loss of bowel control? N -  Managing your Medications? N -  Managing your Finances? N -  Housekeeping or managing your Housekeeping? N -  Some recent data might be hidden    Patient Care Team: Doree Albee, MD as PCP - General (Internal Medicine) Satira Sark, MD as PCP - Cardiology (Cardiology)  Indicate any recent Medical Services you may have received from other than [facility] providers in the past year (date may be approximate).     Assessment:   This is a routine wellness examination for [place].  Hearing/Vision screen  Hearing Screening   125Hz  250Hz  500Hz  1000Hz  2000Hz  3000Hz  4000Hz  6000Hz  8000Hz   Right ear:           Left ear:             Visual Acuity Screening   Right  eye Left eye Both eyes  Without correction:     With correction: 20/20 20/20 20/20     Dietary issues and exercise activities discussed: Current Exercise Habits: The patient does not participate in regular exercise at present, Exercise limited by: cardiac condition(s)  Goals   None    Depression Screen PHQ 2/9 Scores 02/28/2021 02/16/2020 02/17/2018 02/17/2018 11/19/2017  PHQ - 2 Score 0 0 0 0 0  Exception Documentation - Medical reason - - -    Fall Risk Fall Risk  02/28/2021 02/16/2020 02/17/2018 02/17/2018 11/19/2017  Falls in the past year? 0 0 Yes No No  Number falls in past yr: 0 0 1 - -  Injury with Fall? 0 0 No - -  Risk for fall due to : No Fall Risks No Fall Risks - - -  Follow up Falls evaluation completed Falls evaluation completed - - -    FALL RISK PREVENTION PERTAINING TO THE HOME:  Any stairs in or around the home? Yes  If so, are there any without handrails? No  Home free of loose throw rugs in walkways, pet beds, electrical cords, etc? Yes  Adequate lighting in your home to reduce risk of falls? Yes   ASSISTIVE DEVICES UTILIZED TO PREVENT FALLS:  Life alert? No  Use of a cane, walker or w/c? No  Grab bars in the bathroom? Yes  - outside of the shower door, not near toilet Shower chair or bench in shower? Yes  Elevated toilet seat or a handicapped toilet? No   TIMED UP AND GO:  Was the test performed? Yes .  Length of time to ambulate 10 feet: 6 sec.   Gait steady and fast without use of assistive device  Cognitive Function:     6CIT Screen 02/28/2021 02/16/2020  What Year? 0 points 0 points  What month? 0 points 0 points  What time? 0 points 0 points  Count back from 20 0 points 0 points  Months in  reverse 0 points 0 points  Repeat phrase 2 points 0 points  Total Score 2 0    Immunizations Immunization History  Administered Date(s) Administered  . Fluad Quad(high Dose 65+) 09/08/2019, 08/29/2020  . Influenza Split 11/24/2012, 08/12/2017  .  Influenza,inj,Quad PF,6+ Mos 08/27/2016  . Moderna Sars-Covid-2 Vaccination 12/09/2019, 01/07/2020, 09/03/2020  . Pneumococcal Conjugate-13 11/19/2017  . Pneumococcal Polysaccharide-23 11/24/2012  . Tdap 02/16/2020  . Zoster Recombinat (Shingrix) 08/01/2019, 08/31/2019    TDAP status: Up to date  Flu Vaccine status: Up to date  Pneumococcal vaccine status: Completed during today's visit.  Covid-19 vaccine status: Completed vaccines  Qualifies for Shingles Vaccine? No   Zostavax completed No   Shingrix Completed?: Yes  Screening Tests Health Maintenance  Topic Date Due  . COVID-19 Vaccine (4 - Booster for Moderna series) 03/03/2021  . INFLUENZA VACCINE  06/03/2021  . COLONOSCOPY (Pts 45-97yrs Insurance coverage will need to be confirmed)  03/21/2024  . TETANUS/TDAP  02/15/2030  . Hepatitis C Screening  Completed  . PNA vac Low Risk Adult  Completed  . HPV VACCINES  Aged Out    Health Maintenance  Health Maintenance Due  Topic Date Due  . COVID-19 Vaccine (4 - Booster for Moderna series) 03/03/2021    Colorectal cancer screening: No longer required.   Lung Cancer Screening: (Low Dose CT Chest recommended if Age 62-80 years, 30 pack-year currently smoking OR have quit w/in 15years.) does not qualify.   Lung Cancer Screening Referral: N/A  Additional Screening:  Hepatitis C Screening: does not qualify; Completed 11/2017  Vision Screening: Recommended annual ophthalmology exams for early detection of glaucoma and other disorders of the eye. Is the patient up to date with their annual eye exam?  Yes  Who is the provider or what is the name of the office in which the patient attends annual eye exams? [facility] If pt is not established with a provider, would they like to be referred to a provider to establish care? No .   Dental Screening: Recommended annual dental exams for proper oral hygiene  Community Resource Referral / Chronic Care Management: CRR  required this visit?  No   CCM required this visit?  No      Plan:   Months patient is up-to-date with recommended screenings for a male of his age.  Last PPSV23 vaccine was administered around age 39.  We did discuss possibly having a booster administered today, and he would like to have this administered.  We will do this.  I have personally reviewed and noted the following in the patient's chart:   . Medical and social history . Use of alcohol, tobacco or illicit drugs  . Current medications and supplements . Functional ability and status . Nutritional status . Physical activity . Advanced directives . List of other physicians . Hospitalizations, surgeries, and ER visits in previous 12 months . Vitals . Screenings to include cognitive, depression, and falls . Referrals and appointments  In addition, I have reviewed and discussed with patient certain preventive protocols, quality metrics, and best practice recommendations. A written personalized care plan for preventive services as well as general preventive health recommendations were provided to patient.   He will follow-up later on this year for office visit and then again for annual wellness visit in 1 year.  Ailene Ards, NP   02/28/2021

## 2021-03-01 LAB — LIPID PANEL
Cholesterol: 159 mg/dL (ref <200)
HDL: 43 mg/dL (ref >=40)
LDL Cholesterol (Calc): 99 mg/dL (calc)
Non-HDL Cholesterol (Calc): 116 mg/dL (calc) (ref <130)
Total CHOL/HDL Ratio: 3.7 (calc) (ref <5.0)
Triglycerides: 81 mg/dL (ref <150)

## 2021-03-21 DIAGNOSIS — H25812 Combined forms of age-related cataract, left eye: Secondary | ICD-10-CM | POA: Diagnosis not present

## 2021-03-21 DIAGNOSIS — H3554 Dystrophies primarily involving the retinal pigment epithelium: Secondary | ICD-10-CM | POA: Diagnosis not present

## 2021-03-21 DIAGNOSIS — H35373 Puckering of macula, bilateral: Secondary | ICD-10-CM | POA: Diagnosis not present

## 2021-03-26 ENCOUNTER — Telehealth (INDEPENDENT_AMBULATORY_CARE_PROVIDER_SITE_OTHER): Payer: Self-pay

## 2021-03-26 NOTE — Telephone Encounter (Signed)
Please call him and ask him to have a COVID-19 test.  If he is positive, we can do a telemedicine visit probably tomorrow.  If he is negative, I could see him in the office.

## 2021-03-26 NOTE — Telephone Encounter (Signed)
So we will try to add him virtual visit around 2pm Then. So then he will  take test today.

## 2021-03-26 NOTE — Telephone Encounter (Signed)
Had a death in family. People gather in homes & wife is postive for COVID. Some others in family have as well. Now concern on what to do far  as staying safe and wanted to talk to a provider on this mattter.  He said he is a bit foggy on focus today when left voicemail.

## 2021-03-27 ENCOUNTER — Other Ambulatory Visit (INDEPENDENT_AMBULATORY_CARE_PROVIDER_SITE_OTHER): Payer: Self-pay | Admitting: Internal Medicine

## 2021-03-27 ENCOUNTER — Telehealth (INDEPENDENT_AMBULATORY_CARE_PROVIDER_SITE_OTHER): Payer: Self-pay | Admitting: Internal Medicine

## 2021-03-27 ENCOUNTER — Encounter (INDEPENDENT_AMBULATORY_CARE_PROVIDER_SITE_OTHER): Payer: Self-pay | Admitting: Internal Medicine

## 2021-03-27 ENCOUNTER — Telehealth (INDEPENDENT_AMBULATORY_CARE_PROVIDER_SITE_OTHER): Payer: Medicare Other | Admitting: Internal Medicine

## 2021-03-27 DIAGNOSIS — J029 Acute pharyngitis, unspecified: Secondary | ICD-10-CM | POA: Diagnosis not present

## 2021-03-27 MED ORDER — NIRMATRELVIR/RITONAVIR (PAXLOVID)TABLET: 3.0000 | ORAL_TABLET | Freq: Two times a day (BID) | ORAL | 0 refills | Status: AC

## 2021-03-27 NOTE — Progress Notes (Signed)
Metrics: Intervention Frequency ACO  Documented Smoking Status Yearly  Screened one or more times in 24 months  Cessation Counseling or  Active cessation medication Past 24 months  Past 24 months   Guideline developer: UpToDate (See UpToDate for funding source) Date Released: 2014       Wellness Office Visit  Subjective:  Patient ID: Brad Cruz, male    DOB: [birth year]  Age: 75 y.o. MRN: [id number]  CC: This is an audio telemedicine visit with the permission of the patient was at home and I am in my office.  I used 2 identifiers to identify the patient. Sore throat, nasal congestion. HPI  The patient describes a 48-hour history of the above symptoms.  His wife has been diagnosed with COVID-19 disease.  He has had 3 vaccine doses so far.  He denies any dyspnea, fever, loss of smell or taste.  He did take a COVID-19 home test yesterday around lunchtime and it was negative. Past Medical History:  Diagnosis Date  . Allergy    Mold, cat dander  . Arthritis    cerv spine  . Asthma    Childhood history  . BPH (benign prostatic hyperplasia)   . Cataract   . Coronary atherosclerosis of native coronary artery    a. 06/2012 Myoview:  No ischemia;  b. 06/25/2012 Cath:  LM nl, LAD 50p, 60m, D1 80 ost (small), D2 nl (small), LCX 30 ost, OM1 nl, RI 30 ost, RCA 50-60- ost, 72m, PDA minor irregs, PL nl (small), EF 65%.   Marland Kitchen GERD (gastroesophageal reflux disease)   . Hyperlipidemia   . Hypothyroidism   . Migraines   . 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 Skin cancer, basal cell    BCC face  . Vasovagal syncope   . Vitamin D deficiency disease 08/09/2019   Past Surgical History:  Procedure Laterality Date  . BIOPSY  09/09/2017   Procedure: BIOPSY;  Surgeon: Rogene Houston, MD;  [facility];  Service: Endoscopy;;  gastric  . CARDIAC CATHETERIZATION    . COLONOSCOPY    . Deviated septum repair 1989    . ESOPHAGOGASTRODUODENOSCOPY N/A 09/09/2017   Procedure:  ESOPHAGOGASTRODUODENOSCOPY (EGD);  Surgeon: Rogene Houston, MD;  [facility];  Service: Endoscopy;  Laterality: N/A;  . LEFT HEART CATHETERIZATION WITH CORONARY ANGIOGRAM N/A 06/25/2012   Procedure: LEFT HEART CATHETERIZATION WITH CORONARY ANGIOGRAM;  Surgeon: Minus Breeding, MD;  [facility] CATH LAB;  Service: Cardiovascular;  Laterality: N/A;  . LUMBAR LAMINECTOMY/DECOMPRESSION MICRODISCECTOMY  11/23/2012   Procedure: LUMBAR LAMINECTOMY/DECOMPRESSION MICRODISCECTOMY 1 LEVEL;  Surgeon: Floyce Stakes, MD;  Location: [facility];  Service: Neurosurgery;  Laterality: Left;  Left Lumbar five-sacral one Diskectomy  . SKIN CANCER EXCISION     Under left eye basal cell  . SPINE SURGERY       Family History  Problem Relation Age of Onset  . Heart disease Mother        age 65  . Transient ischemic attack Mother   . Stroke Mother   . Stomach cancer Sister   . Cancer Sister        age 47  . Colon polyps Brother   . Heart disease Brother   . Thyroid cancer Brother   . Alzheimer's disease Father        46  . Colon cancer Neg Hx     Social History   Social History Narrative   Lives with  [name]   Married 35 years   Involved with church and the [employer]    Social History   Tobacco Use  . Smoking status: Never Smoker  . Smokeless tobacco: Never Used  Substance Use Topics  . Alcohol use: No    Alcohol/week: 0.0 standard drinks    Current Meds  Medication Sig  . acetaminophen (TYLENOL) 500 MG tablet Take 500 mg by mouth every 6 (six) hours as needed.  Marland Kitchen CAREPOINT SAFETY1ST SYR/NEEDLE 23G X 1" 1 ML MISC USE AS DIRECTED.UC  . Cholecalciferol 125 MCG (5000 UT) capsule Take 10,000 Units by mouth daily.  . clotrimazole (LOTRIMIN) 1 % cream Apply 1 application topically 2 (two) times daily.  . fexofenadine (ALLEGRA) 180 MG tablet Take 180 mg by mouth daily.  . NEOMYCIN-POLYMYXIN-HYDROCORTISONE (CORTISPORIN) 1 % SOLN OTIC solution Place 3 drops into both ears 4 (four)  times daily.  . Psyllium (METAMUCIL FIBER PO) Take 5 mLs by mouth daily. Mix with water  . rosuvastatin (CRESTOR) 5 MG tablet Take 1 tablet (5 mg total) by mouth 2 (two) times a week.  Marland Kitchen SAFETY-LOK 3CC SYR 22GX1.5" 22G X 1-1/2" 3 ML MISC   . Saw Palmetto, Serenoa repens, 320 MG CAPS Take 640 mg at bedtime by mouth.   . testosterone cypionate (DEPOTESTOSTERONE CYPIONATE) 200 MG/ML injection Inject 0.5 mLs (100 mg total) into the muscle once a week. 0.72ml weekly  . TUBERCULIN SYR 1CC/25GX5/8" 25G X 5/8" 1 ML MISC   . vitamin B-12 (CYANOCOBALAMIN) 1000 MCG tablet Take 1,000 mcg by mouth daily.       Objective:   Today's Vitals: There were no vitals taken for this visit. Vitals with BMI 03/27/2021 02/28/2021 02/28/2021  Height (No Data) 5\' 9"  5\' 9"   Weight (No Data) 178 lbs 178 lbs 3 oz  BMI - 53.66 44.0  Systolic (No Data) 347 425  Diastolic (No Data) 72 72  Pulse - 72 72     Physical Exam He appears to be alert and orientated on the phone.  He does not appear to be dyspneic whilst he is talking to me.      Assessment   1. Sore throat       Tests ordered No orders of the defined types were placed in this encounter.    Plan: 1. I recommended the patient repeat the COVID-19 test 24 hours after the 1 he did yesterday.  If it is positive, he would be a candidate for specific antiviral therapy and he will let me know if he desires to have this if he is positive.  If he is negative, I think watchful waiting is appropriate.  He will let me know later on today. 2. This phone call lasted 6 minutes.   No orders of the defined types were placed in this encounter.   Doree Albee, MD

## 2021-03-27 NOTE — Telephone Encounter (Signed)
Done

## 2021-04-04 NOTE — Telephone Encounter (Signed)
Closing open note

## 2021-04-08 ENCOUNTER — Encounter (INDEPENDENT_AMBULATORY_CARE_PROVIDER_SITE_OTHER): Payer: Self-pay | Admitting: Internal Medicine

## 2021-04-15 ENCOUNTER — Encounter (INDEPENDENT_AMBULATORY_CARE_PROVIDER_SITE_OTHER): Payer: Self-pay | Admitting: Internal Medicine

## 2021-04-15 ENCOUNTER — Other Ambulatory Visit (INDEPENDENT_AMBULATORY_CARE_PROVIDER_SITE_OTHER): Payer: Self-pay | Admitting: Internal Medicine

## 2021-04-17 ENCOUNTER — Ambulatory Visit (INDEPENDENT_AMBULATORY_CARE_PROVIDER_SITE_OTHER): Payer: Medicare Other | Admitting: Internal Medicine

## 2021-04-17 ENCOUNTER — Encounter (INDEPENDENT_AMBULATORY_CARE_PROVIDER_SITE_OTHER): Payer: Self-pay | Admitting: Internal Medicine

## 2021-04-17 ENCOUNTER — Other Ambulatory Visit: Payer: Self-pay

## 2021-04-17 VITALS — BP 120/70 | HR 72 | Temp 98.0°F | Resp 18 | Ht 69.0 in | Wt 177.0 lb

## 2021-04-17 DIAGNOSIS — B369 Superficial mycosis, unspecified: Secondary | ICD-10-CM

## 2021-04-17 MED ORDER — CLOTRIMAZOLE-BETAMETHASONE 1-0.05 % EX CREA: 1.0000 "application " | TOPICAL_CREAM | Freq: Two times a day (BID) | CUTANEOUS | 0 refills | Status: DC

## 2021-04-17 NOTE — Progress Notes (Signed)
Metrics: Intervention Frequency ACO  Documented Smoking Status Yearly  Screened one or more times in 24 months  Cessation Counseling or  Active cessation medication Past 24 months  Past 24 months   Guideline developer: UpToDate (See UpToDate for funding source) Date Released: 2014       Wellness Office Visit  Subjective:  Patient ID: Brad Cruz, male    DOB: 1946/08/17  Age: 75 y.o. MRN: [id number]  CC: Anal pain/soreness HPI  The patient describes the above symptoms for approximately 1 week.  He initially related to the antiviral therapy for COVID-19 disease.  However, it seems to still be a problem.  He denies any rectal bleeding. Past Medical History:  Diagnosis Date   Allergy    Mold, cat dander   Arthritis    cerv spine   Asthma    Childhood history   BPH (benign prostatic hyperplasia)    Cataract    Coronary atherosclerosis of native coronary artery    a. 06/2012 Myoview:  No ischemia;  b. 06/25/2012 Cath:  LM nl, LAD 50p, 75m, D1 80 ost (small), D2 nl (small), LCX 30 ost, OM1 nl, RI 30 ost, RCA 50-60- ost, 45m, PDA minor irregs, PL nl (small), EF 65%.    GERD (gastroesophageal reflux disease)    Hyperlipidemia    Hypothyroidism    Migraines    Pulmonary granuloma (HCC)    Chest CT 6/13   Skin cancer, basal cell    BCC face   Vasovagal syncope    Vitamin D deficiency disease 08/09/2019   Past Surgical History:  Procedure Laterality Date   BIOPSY  09/09/2017   Procedure: BIOPSY;  Surgeon: Rogene Houston, MD;  Location: [facility];  Service: Endoscopy;;  gastric   CARDIAC CATHETERIZATION     COLONOSCOPY     Deviated septum repair 1989     ESOPHAGOGASTRODUODENOSCOPY N/A 09/09/2017   Procedure: ESOPHAGOGASTRODUODENOSCOPY (EGD);  Surgeon: Rogene Houston, MD;  Location: [facility];  Service: Endoscopy;  Laterality: N/A;   LEFT HEART CATHETERIZATION WITH CORONARY ANGIOGRAM N/A 06/25/2012   Procedure: LEFT HEART CATHETERIZATION WITH CORONARY ANGIOGRAM;   Surgeon: Minus Breeding, MD;  Location: [facility] C[facility] LAB;  Service: Cardiovascular;  Laterality: N/A;   LUMBAR LAMINECTOMY/DECOMPRESSION MICRODISCECTOMY  11/23/2012   Procedure: LUMBAR LAMINECTOMY/DECOMPRESSION MICRODISCECTOMY 1 LEVEL;  Surgeon: Floyce Stakes, MD;  Location: [facility];  Service: Neurosurgery;  Laterality: Left;  Left Lumbar five-sacral one Diskectomy   SKIN CANCER EXCISION     Under left eye basal cell   SPINE SURGERY       Family History  Problem Relation Age of Onset   Heart disease Mother        age 75   Transient ischemic attack Mother    Stroke Mother    Stomach cancer Sister    Cancer Sister        age 29   Colon polyps Brother    Heart disease Brother    Thyroid cancer Brother    Alzheimer's disease Father        24   Colon cancer Neg Hx     Social History   Social History Narrative   Lives with Banker   Married 73 years   Involved with church and the [employer]    Social History   Tobacco Use   Smoking status: Never   Smokeless tobacco: Never  Substance Use Topics   Alcohol use: No    Alcohol/week: 0.0 standard drinks  Current Meds  Medication Sig   acetaminophen (TYLENOL) 500 MG tablet Take 500 mg by mouth every 6 (six) hours as needed.   CAREPOINT SAFETY1ST SYR/NEEDLE 23G X 1" 1 ML MISC USE AS DIRECTED.UC   Cholecalciferol 125 MCG (5000 UT) capsule Take 10,000 Units by mouth daily.   clotrimazole (LOTRIMIN) 1 % cream Apply 1 application topically 2 (two) times daily.   clotrimazole-betamethasone (LOTRISONE) cream Apply 1 application topically 2 (two) times daily.   fexofenadine (ALLEGRA) 180 MG tablet Take 180 mg by mouth daily.   NEOMYCIN-POLYMYXIN-HYDROCORTISONE (CORTISPORIN) 1 % SOLN OTIC solution Place 3 drops into both ears 4 (four) times daily.   Psyllium (METAMUCIL FIBER PO) Take 5 mLs by mouth daily. Mix with water   rosuvastatin (CRESTOR) 5 MG tablet Take 1 tablet (5 mg total) by mouth 2 (two) times a week.   SAFETY-LOK 3CC SYR  22GX1.5" 22G X 1-1/2" 3 ML MISC    Saw Palmetto, Serenoa repens, 320 MG CAPS Take 640 mg at bedtime by mouth.    testosterone cypionate (DEPOTESTOSTERONE CYPIONATE) 200 MG/ML injection Inject 0.5 mLs (100 mg total) into the muscle once a week. 0.76ml weekly   TUBERCULIN SYR 1CC/25GX5/8" 25G X 5/8" 1 ML MISC    vitamin B-12 (CYANOCOBALAMIN) 1000 MCG tablet Take 1,000 mcg by mouth daily.       Objective:   Today's Vitals: BP 120/70 (BP Location: Right Arm, Patient Position: Sitting, Cuff Size: Normal)   Pulse 72   Temp 98 F (36.7 C) (Temporal)   Resp 18   Ht 5\' 9"  (1.753 m)   Wt 177 lb (80.3 kg)   SpO2 98%   BMI 26.14 kg/m  Vitals with BMI 04/17/2021 03/27/2021 02/28/2021  Height 5\' 9"  (No Data) 5\' 9"   Weight 177 lbs (No Data) 178 lbs  BMI 59.16 - 38.46  Systolic 659 (No Data) 935  Diastolic 70 (No Data) 72  Pulse 72 - 72     Physical Exam  Examination of his anal area shows erythema around the anus on the buttocks, I wonder if this is fungal infection.  There were no obvious external hemorrhoids seen.  I did not do a rectal examination today.     Assessment   1. Fungal dermatitis       Tests ordered No orders of the defined types were placed in this encounter.    Plan: 1.  I will treat him with Lotrisone cream to see if this will help and I have told him that if it does not improve in the next 5 to 7 days, he should let me know.  I may need to refer him to a specialist such as dermatology or gastroenterology    Meds ordered this encounter  Medications   clotrimazole-betamethasone (LOTRISONE) cream    Sig: Apply 1 application topically 2 (two) times daily.    Dispense:  30 g    Refill:  0     Carnell Beavers Luther Parody, MD

## 2021-04-22 ENCOUNTER — Encounter (INDEPENDENT_AMBULATORY_CARE_PROVIDER_SITE_OTHER): Payer: Self-pay | Admitting: Internal Medicine

## 2021-04-23 DIAGNOSIS — Z85828 Personal history of other malignant neoplasm of skin: Secondary | ICD-10-CM | POA: Diagnosis not present

## 2021-04-23 DIAGNOSIS — D485 Neoplasm of uncertain behavior of skin: Secondary | ICD-10-CM | POA: Diagnosis not present

## 2021-04-23 DIAGNOSIS — L570 Actinic keratosis: Secondary | ICD-10-CM | POA: Diagnosis not present

## 2021-04-26 DIAGNOSIS — H6981 Other specified disorders of Eustachian tube, right ear: Secondary | ICD-10-CM | POA: Diagnosis not present

## 2021-04-26 DIAGNOSIS — H838X3 Other specified diseases of inner ear, bilateral: Secondary | ICD-10-CM | POA: Diagnosis not present

## 2021-04-26 DIAGNOSIS — H903 Sensorineural hearing loss, bilateral: Secondary | ICD-10-CM | POA: Diagnosis not present

## 2021-05-08 ENCOUNTER — Other Ambulatory Visit (INDEPENDENT_AMBULATORY_CARE_PROVIDER_SITE_OTHER): Payer: Self-pay | Admitting: Internal Medicine

## 2021-08-23 ENCOUNTER — Ambulatory Visit: Payer: Medicare Other | Admitting: Nurse Practitioner

## 2021-09-03 ENCOUNTER — Ambulatory Visit (INDEPENDENT_AMBULATORY_CARE_PROVIDER_SITE_OTHER): Payer: Medicare Other | Admitting: Internal Medicine

## 2021-09-20 ENCOUNTER — Ambulatory Visit: Payer: Medicare Other | Admitting: Nurse Practitioner

## 2021-10-09 DIAGNOSIS — J101 Influenza due to other identified influenza virus with other respiratory manifestations: Secondary | ICD-10-CM | POA: Diagnosis not present

## 2021-10-09 DIAGNOSIS — R070 Pain in throat: Secondary | ICD-10-CM | POA: Diagnosis not present

## 2021-10-17 DIAGNOSIS — H3554 Dystrophies primarily involving the retinal pigment epithelium: Secondary | ICD-10-CM | POA: Diagnosis not present

## 2021-10-17 DIAGNOSIS — H35373 Puckering of macula, bilateral: Secondary | ICD-10-CM | POA: Diagnosis not present

## 2021-10-17 DIAGNOSIS — H25813 Combined forms of age-related cataract, bilateral: Secondary | ICD-10-CM | POA: Diagnosis not present

## 2021-11-19 DIAGNOSIS — D225 Melanocytic nevi of trunk: Secondary | ICD-10-CM | POA: Diagnosis not present

## 2021-11-19 DIAGNOSIS — Z85828 Personal history of other malignant neoplasm of skin: Secondary | ICD-10-CM | POA: Diagnosis not present

## 2021-11-19 DIAGNOSIS — D2261 Melanocytic nevi of right upper limb, including shoulder: Secondary | ICD-10-CM | POA: Diagnosis not present

## 2021-11-19 DIAGNOSIS — L814 Other melanin hyperpigmentation: Secondary | ICD-10-CM | POA: Diagnosis not present

## 2021-11-19 DIAGNOSIS — L821 Other seborrheic keratosis: Secondary | ICD-10-CM | POA: Diagnosis not present

## 2021-11-19 DIAGNOSIS — D1801 Hemangioma of skin and subcutaneous tissue: Secondary | ICD-10-CM | POA: Diagnosis not present

## 2021-11-20 ENCOUNTER — Other Ambulatory Visit: Payer: Self-pay

## 2021-11-20 ENCOUNTER — Ambulatory Visit (INDEPENDENT_AMBULATORY_CARE_PROVIDER_SITE_OTHER): Payer: Medicare Other | Admitting: Internal Medicine

## 2021-11-20 ENCOUNTER — Encounter: Payer: Self-pay | Admitting: Internal Medicine

## 2021-11-20 VITALS — BP 112/72 | HR 60 | Resp 18 | Ht 69.0 in | Wt 174.0 lb

## 2021-11-20 DIAGNOSIS — Z125 Encounter for screening for malignant neoplasm of prostate: Secondary | ICD-10-CM | POA: Insufficient documentation

## 2021-11-20 DIAGNOSIS — I2510 Atherosclerotic heart disease of native coronary artery without angina pectoris: Secondary | ICD-10-CM | POA: Diagnosis not present

## 2021-11-20 DIAGNOSIS — E782 Mixed hyperlipidemia: Secondary | ICD-10-CM

## 2021-11-20 DIAGNOSIS — E559 Vitamin D deficiency, unspecified: Secondary | ICD-10-CM

## 2021-11-20 DIAGNOSIS — Z85828 Personal history of other malignant neoplasm of skin: Secondary | ICD-10-CM | POA: Diagnosis not present

## 2021-11-20 DIAGNOSIS — N401 Enlarged prostate with lower urinary tract symptoms: Secondary | ICD-10-CM

## 2021-11-20 DIAGNOSIS — R7303 Prediabetes: Secondary | ICD-10-CM | POA: Diagnosis not present

## 2021-11-20 DIAGNOSIS — N138 Other obstructive and reflux uropathy: Secondary | ICD-10-CM | POA: Diagnosis not present

## 2021-11-20 DIAGNOSIS — R5382 Chronic fatigue, unspecified: Secondary | ICD-10-CM | POA: Diagnosis not present

## 2021-11-20 NOTE — Assessment & Plan Note (Signed)
Asymptomatic currently On statin Followed by cardiology 

## 2021-11-20 NOTE — Assessment & Plan Note (Signed)
On Crestor 5 mg twice in a week, did not tolerate daily dose Check lipid profile

## 2021-11-20 NOTE — Assessment & Plan Note (Signed)
Has had excision of skin lesions Followed by dermatology - Dr. Ronnald Ramp

## 2021-11-20 NOTE — Patient Instructions (Signed)
Please take Vitamin D 2000 IU once daily for now.  Please continue to take other medications as prescribed.

## 2021-11-20 NOTE — Assessment & Plan Note (Signed)
Ordered PSA after discussing its limitations for prostate cancer screening, including false positive results leading additional investigations. 

## 2021-11-20 NOTE — Assessment & Plan Note (Signed)
Was on testosterone therapy in the past, could be rebound fatigue Chart review shows normal testosterone levels in the past Check CMP, TSH, free T4 and testosterone

## 2021-11-20 NOTE — Progress Notes (Signed)
New Patient Office Visit  Subjective:  Patient ID: Brad Cruz, male    DOB: 1946-05-15  Age: 76 y.o. MRN: [id number]  CC:  Chief Complaint  Patient presents with   New Patient (Initial Visit)    New patient was seeing dr [name] just establishing care     HPI Brad Cruz is a 76 y.o. male with past medical history of nonobstructive CAD and HLD who presents for establishing care.  He takes Crestor for HLD and history of CAD.  He denies any chest pain, dyspnea or palpitations.  He follows up with Dr. Domenic Polite for history of CAD.  He complains of chronic fatigue since he could not get testosterone injections.  Chart review suggests normal testosterone level in the past.  Last testosterone level was elevated and he was still continued on testosterone injection.  He denies any recent change in appetite, weight loss, night sweats, cough, LAD, bowel or urinary problem.  He denies anhedonia, SI or HI.  Of note, he was also placed on thyroid treatment even though his thyroid function was normal in the past.  He has occasional snoring, but does not have daytime sleepiness.  He is up-to-date with pneumococcal and Shingrix vaccines.  Past Medical History:  Diagnosis Date   Allergy    Mold, cat dander   Arthritis    cerv spine   Asthma    Childhood history   BPH (benign prostatic hyperplasia)    Cataract    Coronary atherosclerosis of native coronary artery    a. 06/2012 Myoview:  No ischemia;  b. 06/25/2012 Cath:  LM nl, LAD 50p, 63m D1 80 ost (small), D2 nl (small), LCX 30 ost, OM1 nl, RI 30 ost, RCA 50-60- ost, 361mPDA minor irregs, PL nl (small), EF 65%.    GERD (gastroesophageal reflux disease)    Hyperlipidemia    Hypothyroidism    Migraines    Pulmonary granuloma (HCC)    Chest CT 6/13   Skin cancer, basal cell    BCC face   Vasovagal syncope    Vitamin D deficiency disease 08/09/2019    Past Surgical History:  Procedure Laterality Date   BIOPSY  09/09/2017    Procedure: BIOPSY;  Surgeon: ReRogene HoustonMD;  [facility]: [facility];  Service: Endoscopy;;  gastric   CARDIAC CATHETERIZATION     COLONOSCOPY     Deviated septum repair 1989     ESOPHAGOGASTRODUODENOSCOPY N/A 09/09/2017   Procedure: ESOPHAGOGASTRODUODENOSCOPY (EGD);  Surgeon: ReRogene HoustonMD;  Location: [facility];  Service: Endoscopy;  Laterality: N/A;   LEFT HEART CATHETERIZATION WITH CORONARY ANGIOGRAM N/A 06/25/2012   Procedure: LEFT HEART CATHETERIZATION WITH CORONARY ANGIOGRAM;  Surgeon: JaMinus BreedingMD;  [facility];  Service: Cardiovascular;  Laterality: N/A;   LUMBAR LAMINECTOMY/DECOMPRESSION MICRODISCECTOMY  11/23/2012   Procedure: LUMBAR LAMINECTOMY/DECOMPRESSION MICRODISCECTOMY 1 LEVEL;  Surgeon: ErFloyce StakesMD;  Location: [facility];  Service: Neurosurgery;  Laterality: Left;  Left Lumbar five-sacral one Diskectomy   SKIN CANCER EXCISION     Under left eye basal cell   SPINE SURGERY      Family History  Problem Relation Age of Onset   Heart disease Mother        age 76 Transient ischemic attack Mother    Stroke Mother    Stomach cancer Sister    Cancer Sister        age 76 Colon polyps Brother  Heart disease Brother    Thyroid cancer Brother    Alzheimer's disease Father        63   Colon cancer Neg Hx     Social History   Socioeconomic History   Marital status: Married    Spouse name: Brad Cruz   Number of children: 1   Years of education: 44   Highest education level: Professional school degree (e.g., MD, DDS, DVM, JD)  Occupational History   Occupation: [employer] x 5 years    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   Occupation: attorney  retired  Tobacco Use   Smoking status: Never   Smokeless tobacco: Never  Vaping Use   Vaping Use: Never used  Substance and Sexual Activity   Alcohol use: No    Alcohol/week: 0.0 standard drinks   Drug use: No   Sexual activity: Yes    Partners: Female    Birth control/protection:  Post-menopausal  Other Topics Concern   Not on file  Social History Narrative   Lives with Brad Cruz   Married 35 years   Involved with church and the [employer]    Social Determinants of Health   Financial Resource Strain: Not on file  Food Insecurity: Not on file  Transportation Needs: Not on file  Physical Activity: Not on file  Stress: Not on file  Social Connections: Not on file  Intimate Partner Violence: Not on file    ROS Review of Systems  Constitutional:  Positive for fatigue. Negative for chills and fever.  HENT:  Negative for congestion and sore throat.   Eyes:  Negative for pain and discharge.  Respiratory:  Negative for cough and shortness of breath.   Cardiovascular:  Negative for chest pain and palpitations.  Gastrointestinal:  Negative for constipation, diarrhea, nausea and vomiting.  Endocrine: Negative for polydipsia and polyuria.  Genitourinary:  Negative for dysuria and hematuria.  Musculoskeletal:  Negative for neck pain and neck stiffness.  Skin:  Negative for rash.  Neurological:  Negative for dizziness, weakness, numbness and headaches.  Psychiatric/Behavioral:  Negative for agitation and behavioral problems.    Objective:   Today's Vitals: BP 112/72 (BP Location: Left Arm, Patient Position: Sitting, Cuff Size: Normal)    Pulse 60    Resp 18    Ht _0  (1.753 m)    Wt 174 lb (78.9 kg)    SpO2 97%    BMI 25.70 kg/m   Physical Exam Vitals reviewed.  Constitutional:      General: He is not in acute distress.    Appearance: He is not diaphoretic.  HENT:     Head: Normocephalic and atraumatic.     Nose: Nose normal.     Mouth/Throat:     Mouth: Mucous membranes are moist.  Eyes:     General: No scleral icterus.    Extraocular Movements: Extraocular movements intact.  Cardiovascular:     Rate and Rhythm: Normal rate and regular rhythm.     Pulses: Normal pulses.     Heart sounds: Normal heart sounds. No murmur heard. Pulmonary:     Breath sounds:  Normal breath sounds. No wheezing or rales.  Musculoskeletal:     Cervical back: Neck supple. No tenderness.     Right lower leg: No edema.     Left lower leg: No edema.  Skin:    General: Skin is warm.     Findings: No rash.  Neurological:     General: No focal deficit present.  Mental Status: He is alert and oriented to person, place, and time.     Sensory: No sensory deficit.     Motor: No weakness.  Psychiatric:        Mood and Affect: Mood normal.        Behavior: Behavior normal.    Assessment & Plan:   Problem List Items Addressed This Visit       Cardiovascular and Mediastinum   CAD (coronary artery disease), native coronary artery - Primary    Asymptomatic currently On statin Followed by cardiology      Relevant Orders   CMP14+EGFR   CBC with Differential/Platelet   Lipid Profile     Musculoskeletal and Integument   History of basal cell cancer    Has had excision of skin lesions Followed by dermatology - Dr. Ronnald Ramp      Relevant Orders   CBC with Differential/Platelet     Genitourinary   BPH with obstruction/lower urinary tract symptoms   Relevant Orders   PSA     Other   Hyperlipidemia    On Crestor 5 mg twice in a week, did not tolerate daily dose Check lipid profile      Relevant Orders   Lipid Profile   Vitamin D deficiency    Takes vitamin D 10,000 IU QD Advised to take Vitamin D 2000 IU QD for now Check vitamin D level      Relevant Orders   VITAMIN D 25 Hydroxy (Vit-D Deficiency, Fractures)   Chronic fatigue    Was on testosterone therapy in the past, could be rebound fatigue Chart review shows normal testosterone levels in the past Check CMP, TSH, free T4 and testosterone      Relevant Orders   TSH + free T4   Testosterone Free, Profile I   Prostate cancer screening    Ordered PSA after discussing its limitations for prostate cancer screening, including false positive results leading additional investigations.       Relevant Orders   PSA   Other Visit Diagnoses     Prediabetes       Relevant Orders   Hemoglobin A1c       Outpatient Encounter Medications as of 11/20/2021  Medication Sig   acetaminophen (TYLENOL) 500 MG tablet Take 500 mg by mouth every 6 (six) hours as needed.   Cholecalciferol 125 MCG (5000 UT) capsule Take 10,000 Units by mouth daily.   clotrimazole-betamethasone (LOTRISONE) cream Apply 1 application topically 2 (two) times daily.   fexofenadine (ALLEGRA) 180 MG tablet Take 180 mg by mouth daily.   Psyllium (METAMUCIL FIBER PO) Take 5 mLs by mouth daily. Mix with water   rosuvastatin (CRESTOR) 5 MG tablet TAKE 1 TABLET TWICE A WEEK   Saw Palmetto, Serenoa repens, 320 MG CAPS Take 640 mg at bedtime by mouth.    vitamin B-12 (CYANOCOBALAMIN) 1000 MCG tablet Take 1,000 mcg by mouth daily.   [DISCONTINUED] CAREPOINT SAFETY1ST SYR/NEEDLE 23G X 1" 1 ML MISC USE AS DIRECTED.UC   [DISCONTINUED] clotrimazole (LOTRIMIN) 1 % cream Apply 1 application topically 2 (two) times daily.   [DISCONTINUED] NEOMYCIN-POLYMYXIN-HYDROCORTISONE (CORTISPORIN) 1 % SOLN OTIC solution Place 3 drops into both ears 4 (four) times daily.   [DISCONTINUED] SAFETY-LOK 3CC SYR 22GX1.5" 22G X 1-1/2" 3 ML MISC    [DISCONTINUED] testosterone cypionate (DEPOTESTOSTERONE CYPIONATE) 200 MG/ML injection Inject 0.5 mLs (100 mg total) into the muscle once a week. 0.19m weekly   [DISCONTINUED] TUBERCULIN SYR 1CC/25GX5/8" 25G X 5/8" 1 ML  MISC    No facility-administered encounter medications on file as of 11/20/2021.    Follow-up: Return in about 6 months (around 05/20/2022) for Chronic fatigue.   Lindell Spar, MD

## 2021-11-20 NOTE — Assessment & Plan Note (Signed)
Takes vitamin D 10,000 IU QD Advised to take Vitamin D 2000 IU QD for now Check vitamin D level

## 2021-11-21 DIAGNOSIS — Z85828 Personal history of other malignant neoplasm of skin: Secondary | ICD-10-CM | POA: Diagnosis not present

## 2021-11-21 DIAGNOSIS — R7303 Prediabetes: Secondary | ICD-10-CM | POA: Diagnosis not present

## 2021-11-21 DIAGNOSIS — N138 Other obstructive and reflux uropathy: Secondary | ICD-10-CM | POA: Diagnosis not present

## 2021-11-21 DIAGNOSIS — N401 Enlarged prostate with lower urinary tract symptoms: Secondary | ICD-10-CM | POA: Diagnosis not present

## 2021-11-21 DIAGNOSIS — E782 Mixed hyperlipidemia: Secondary | ICD-10-CM | POA: Diagnosis not present

## 2021-11-21 DIAGNOSIS — R5382 Chronic fatigue, unspecified: Secondary | ICD-10-CM | POA: Diagnosis not present

## 2021-11-21 DIAGNOSIS — E559 Vitamin D deficiency, unspecified: Secondary | ICD-10-CM | POA: Diagnosis not present

## 2021-11-21 DIAGNOSIS — I2510 Atherosclerotic heart disease of native coronary artery without angina pectoris: Secondary | ICD-10-CM | POA: Diagnosis not present

## 2021-11-21 DIAGNOSIS — Z125 Encounter for screening for malignant neoplasm of prostate: Secondary | ICD-10-CM | POA: Diagnosis not present

## 2021-11-22 ENCOUNTER — Other Ambulatory Visit: Payer: Self-pay | Admitting: Internal Medicine

## 2021-11-22 DIAGNOSIS — E039 Hypothyroidism, unspecified: Secondary | ICD-10-CM

## 2021-11-22 LAB — CMP14+EGFR
ALT: 26 IU/L (ref 0–44)
AST: 28 IU/L (ref 0–40)
Albumin/Globulin Ratio: 2 (ref 1.2–2.2)
Albumin: 4.6 g/dL (ref 3.7–4.7)
Alkaline Phosphatase: 48 IU/L (ref 44–121)
BUN/Creatinine Ratio: 21 (ref 10–24)
BUN: 22 mg/dL (ref 8–27)
Bilirubin Total: 0.8 mg/dL (ref 0.0–1.2)
CO2: 24 mmol/L (ref 20–29)
Calcium: 9.2 mg/dL (ref 8.6–10.2)
Chloride: 102 mmol/L (ref 96–106)
Creatinine, Ser: 1.06 mg/dL (ref 0.76–1.27)
Globulin, Total: 2.3 g/dL (ref 1.5–4.5)
Glucose: 102 mg/dL — ABNORMAL HIGH (ref 70–99)
Potassium: 4.8 mmol/L (ref 3.5–5.2)
Sodium: 141 mmol/L (ref 134–144)
Total Protein: 6.9 g/dL (ref 6.0–8.5)
eGFR: 73 mL/min/{1.73_m2} (ref >59)

## 2021-11-22 LAB — HEMOGLOBIN A1C
Est. average glucose Bld gHb Est-mCnc: 126 mg/dL
Hgb A1c MFr Bld: 6 % — ABNORMAL HIGH (ref 4.8–5.6)

## 2021-11-22 LAB — CBC WITH DIFFERENTIAL/PLATELET
Basophils Absolute: 0 10*3/uL (ref 0.0–0.2)
Basos: 1 %
EOS (ABSOLUTE): 0.2 10*3/uL (ref 0.0–0.4)
Eos: 4 %
Hematocrit: 48.2 % (ref 37.5–51.0)
Hemoglobin: 16 g/dL (ref 13.0–17.7)
Immature Grans (Abs): 0 10*3/uL (ref 0.0–0.1)
Immature Granulocytes: 0 %
Lymphocytes Absolute: 2.3 10*3/uL (ref 0.7–3.1)
Lymphs: 37 %
MCH: 30 pg (ref 26.6–33.0)
MCHC: 33.2 g/dL (ref 31.5–35.7)
MCV: 90 fL (ref 79–97)
Monocytes Absolute: 0.6 10*3/uL (ref 0.1–0.9)
Monocytes: 9 %
Neutrophils Absolute: 3.1 10*3/uL (ref 1.4–7.0)
Neutrophils: 49 %
Platelets: 211 10*3/uL (ref 150–450)
RBC: 5.34 x10E6/uL (ref 4.14–5.80)
RDW: 12.4 % (ref 11.6–15.4)
WBC: 6.2 10*3/uL (ref 3.4–10.8)

## 2021-11-22 LAB — TESTOSTERONE FREE, PROFILE I
Sex Hormone Binding: 45.1 nmol/L (ref 19.3–76.4)
Testost., Free, Calc: 34.8 pg/mL (ref 31.7–120.8)
Testosterone: 227 ng/dL — ABNORMAL LOW (ref 264–916)

## 2021-11-22 LAB — LIPID PANEL
Chol/HDL Ratio: 5.1 ratio — ABNORMAL HIGH (ref 0.0–5.0)
Cholesterol, Total: 197 mg/dL (ref 100–199)
HDL: 39 mg/dL — ABNORMAL LOW (ref >39)
LDL Chol Calc (NIH): 134 mg/dL — ABNORMAL HIGH (ref 0–99)
Triglycerides: 134 mg/dL (ref 0–149)
VLDL Cholesterol Cal: 24 mg/dL (ref 5–40)

## 2021-11-22 LAB — PSA: Prostate Specific Ag, Serum: 2.8 ng/mL (ref 0.0–4.0)

## 2021-11-22 LAB — VITAMIN D 25 HYDROXY (VIT D DEFICIENCY, FRACTURES): Vit D, 25-Hydroxy: 97.1 ng/mL (ref 30.0–100.0)

## 2021-11-22 LAB — TSH+FREE T4
Free T4: 1.19 ng/dL (ref 0.82–1.77)
TSH: 7.22 u[IU]/mL — ABNORMAL HIGH (ref 0.450–4.500)

## 2021-11-22 MED ORDER — LEVOTHYROXINE SODIUM 25 MCG PO TABS: 25.0000 ug | ORAL_TABLET | Freq: Every day | ORAL | 0 refills | Status: DC

## 2021-12-12 ENCOUNTER — Ambulatory Visit: Payer: Medicare Other | Admitting: Internal Medicine

## 2022-02-20 ENCOUNTER — Encounter: Payer: Self-pay | Admitting: Internal Medicine

## 2022-02-20 ENCOUNTER — Ambulatory Visit (INDEPENDENT_AMBULATORY_CARE_PROVIDER_SITE_OTHER): Payer: Medicare Other | Admitting: Internal Medicine

## 2022-02-20 VITALS — BP 118/64 | HR 60 | Resp 18 | Ht 69.0 in | Wt 175.0 lb

## 2022-02-20 DIAGNOSIS — F4321 Adjustment disorder with depressed mood: Secondary | ICD-10-CM | POA: Insufficient documentation

## 2022-02-20 DIAGNOSIS — E039 Hypothyroidism, unspecified: Secondary | ICD-10-CM

## 2022-02-20 DIAGNOSIS — I2510 Atherosclerotic heart disease of native coronary artery without angina pectoris: Secondary | ICD-10-CM | POA: Diagnosis not present

## 2022-02-20 DIAGNOSIS — E782 Mixed hyperlipidemia: Secondary | ICD-10-CM | POA: Diagnosis not present

## 2022-02-20 MED ORDER — LEVOTHYROXINE SODIUM 25 MCG PO TABS: 25.0000 ug | ORAL_TABLET | Freq: Every day | ORAL | 0 refills | Status: DC

## 2022-02-20 MED ORDER — ROSUVASTATIN CALCIUM 5 MG PO TABS: 5.0000 mg | ORAL_TABLET | Freq: Every day | ORAL | 1 refills | Status: DC

## 2022-02-20 NOTE — Assessment & Plan Note (Signed)
Lab Results  ?Component Value Date  ? TSH 7.220 (H) 11/21/2021  ? ?On levothyroxine 25 mcg QD ?Check TSH and free T4 ?Fatigue improved with thyroid supplement now ?

## 2022-02-20 NOTE — Assessment & Plan Note (Signed)
Asymptomatic currently On statin Followed by cardiology 

## 2022-02-20 NOTE — Patient Instructions (Signed)
Please continue taking medications as prescribed. ? ?Please continue to follow heart healthy diet and ambulate as tolerated. ?

## 2022-02-20 NOTE — Assessment & Plan Note (Signed)
Likely due to acute stressor in the setting of his wife's recent diagnosis of metastatic breast cancer ?Denies [facility] therapy referral for now ?

## 2022-02-20 NOTE — Progress Notes (Signed)
? ?Established Patient Office Visit ? ?Subjective:  ?Patient ID: Brad Cruz, male    DOB: 1946/05/21  Age: 76 y.o. MRN: [id number] ? ?CC:  ?Chief Complaint  ?Patient presents with  ? Follow-up  ?  3 month follow up   ? ? ?HPI ?Brad Cruz is a 76 y.o. male with past medical history of nonobstructive CAD, hypothyroidism and HLD who presents for f/u of his chronic medical conditions. ? ?He takes Crestor for HLD and history of CAD.  He denies any chest pain, dyspnea or palpitations.  He follows up with Dr. Domenic Polite for history of CAD. ? ?Hypothyroidism: He has been tolerating levothyroxine well.  He denies any recent change in weight or appetite.  He has chronic fatigue, which has improved since starting levothyroxine.  He also takes testosterone supplement from [facility]. ? ?He is wife was recently diagnosed with metastatic breast cancer, and he has been feeling stressed with it.  He reports mild anhedonia, but denies referral to [facility] therapy for now.  Denies any SI or HI currently. ? ? ? ? ?Past Medical History:  ?Diagnosis Date  ? Allergy   ? Mold, cat dander  ? Arthritis   ? cerv spine  ? Asthma   ? Childhood history  ? BPH (benign prostatic hyperplasia)   ? Cataract   ? Coronary atherosclerosis of native coronary artery   ? a. 06/2012 Myoview:  No ischemia;  b. 06/25/2012 Cath:  LM nl, LAD 50p, 61m D1 80 ost (small), D2 nl (small), LCX 30 ost, OM1 nl, RI 30 ost, RCA 50-60- ost, 381mPDA minor irregs, PL nl (small), EF 65%.   ? GERD (gastroesophageal reflux disease)   ? Hyperlipidemia   ? Hypothyroidism   ? Migraines   ? [place]  [place] Chest CT 6/13  ? Skin cancer, basal cell   ? BCC face  ? Vasovagal syncope   ? Vitamin D deficiency disease 08/09/2019  ? ? ?Past Surgical History:  ?Procedure Laterality Date  ? BIOPSY  09/09/2017  ? Procedure: BIOPSY;  Surgeon: ReRogene HoustonMD;  [facility];  Service: Endoscopy;;  gastric  ? CARDIAC CATHETERIZATION    ? COLONOSCOPY    ?  Deviated septum repair 1989    ? ESOPHAGOGASTRODUODENOSCOPY N/A 09/09/2017  ? Procedure: ESOPHAGOGASTRODUODENOSCOPY (EGD);  Surgeon: ReRogene HoustonMD;  [facility];  Service: Endoscopy;  Laterality: N/A;  ? LEFT HEART CATHETERIZATION WITH CORONARY ANGIOGRAM N/A 06/25/2012  ? Procedure: LEFT HEART CATHETERIZATION WITH CORONARY ANGIOGRAM;  Surgeon: JaMinus BreedingMD;  [facility];  Service: Cardiovascular;  Laterality: N/A;  ? LUMBAR LAMINECTOMY/DECOMPRESSION MICRODISCECTOMY  11/23/2012  ? Procedure: LUMBAR LAMINECTOMY/DECOMPRESSION MICRODISCECTOMY 1 LEVEL;  Surgeon: ErFloyce StakesMD;  [facility];  Service: Neurosurgery;  Laterality: Left;  Left Lumbar five-sacral one Diskectomy  ? SKIN CANCER EXCISION    ? Under left eye basal cell  ? SPINE SURGERY    ? ? ?Family History  ?Problem Relation Age of Onset  ? Heart disease Mother   ?     age 76? Transient ischemic attack Mother   ? Stroke Mother   ? Stomach cancer Sister   ? Cancer Sister   ?     age 76? Colon polyps Brother   ? Heart disease Brother   ? Thyroid cancer Brother   ? Alzheimer's disease Father   ?     [id number]?  Colon cancer Neg Hx   ? ? ?Social History  ? ?Socioeconomic History  ? Marital status: Married  ?  Spouse name: bettie  ? Number of children: 1  ? Years of education: 90  ? Highest education level: Professional school degree (e.g., MD, DDS, DVM, JD)  ?Occupational History  ? Occupation: Therapist, art x 5 years  ?  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  ? Occupation: attorney  retired  ?Tobacco Use  ? Smoking status: Never  ? Smokeless tobacco: Never  ?Vaping Use  ? Vaping Use: Never used  ?Substance and Sexual Activity  ? Alcohol use: No  ?  Alcohol/week: 0.0 standard drinks  ? Drug use: No  ? Sexual activity: Yes  ?  Partners: Female  ?  Birth control/protection: Post-menopausal  ?Other Topics Concern  ? Not on file  ?Social History Narrative  ? Lives with [name]  ? Married 35 years  ? Involved with church and the [place]    ? ?Social Determinants of Health  ? ?Financial Resource Strain: Not on file  ?Food Insecurity: Not on file  ?Transportation Needs: Not on file  ?Physical Activity: Not on file  ?Stress: Not on file  ?Social Connections: Not on file  ?Intimate Partner Violence: Not on file  ? ? ?Outpatient Medications Prior to Visit  ?Medication Sig Dispense Refill  ? acetaminophen (TYLENOL) 500 MG tablet Take 500 mg by mouth every 6 (six) hours as needed.    ? Cholecalciferol 125 MCG (5000 UT) capsule Take 10,000 Units by mouth daily.    ? fexofenadine (ALLEGRA) 180 MG tablet Take 180 mg by mouth daily.    ? Psyllium (METAMUCIL FIBER PO) Take 5 mLs by mouth daily. Mix with water    ? Saw [place]metto, Serenoa repens, 320 MG CAPS Take 640 mg at bedtime by mouth.     ? vitamin B-12 (CYANOCOBALAMIN) 1000 MCG tablet Take 1,000 mcg by mouth daily.    ? levothyroxine (SYNTHROID) 25 MCG tablet Take 1 tablet (25 mcg total) by mouth daily. 90 tablet 0  ? rosuvastatin (CRESTOR) 5 MG tablet TAKE 1 TABLET TWICE A WEEK 26 tablet 3  ? clotrimazole-betamethasone (LOTRISONE) cream Apply 1 application topically 2 (two) times daily. 30 g 0  ? ?No facility-administered medications prior to visit.  ? ? ?Allergies  ?Allergen Reactions  ? Citrus Hives  ?  Avoid grapefruit, OJ  ? Ibuprofen Hives, Swelling and Rash  ? Shellfish-Derived Products Hives  ?  Rash ?  ? Statins Other (See Comments)  ? ? ?ROS ?Review of Systems  ?Constitutional:  Positive for fatigue. Negative for chills and fever.  ?HENT:  Negative for congestion and sore throat.   ?Eyes:  Negative for pain and discharge.  ?Respiratory:  Negative for cough and shortness of breath.   ?Cardiovascular:  Negative for chest pain and palpitations.  ?Gastrointestinal:  Negative for diarrhea, nausea and vomiting.  ?Endocrine: Negative for polydipsia and polyuria.  ?Genitourinary:  Negative for dysuria and hematuria.  ?Musculoskeletal:  Negative for neck pain and neck stiffness.  ?Skin:  Negative for rash.   ?Neurological:  Negative for dizziness, weakness, numbness and headaches.  ?Psychiatric/Behavioral:  Positive for dysphoric mood. Negative for agitation and behavioral problems.   ? ?  ?Objective:  ?  ?Physical Exam ?Vitals reviewed.  ?Constitutional:   ?   General: He is not in acute distress. ?   Appearance: He is not diaphoretic.  ?HENT:  ?   Head: Normocephalic and atraumatic.  ?  Nose: Nose normal.  ?   Mouth/Throat:  ?   Mouth: Mucous membranes are moist.  ?Eyes:  ?   General: No scleral icterus. ?   Extraocular Movements: Extraocular movements intact.  ?Cardiovascular:  ?   Rate and Rhythm: Normal rate and regular rhythm.  ?   Pulses: Normal pulses.  ?   Heart sounds: Normal heart sounds. No murmur heard. ?Pulmonary:  ?   Breath sounds: Normal breath sounds. No wheezing or rales.  ?Musculoskeletal:  ?   Cervical back: Neck supple. No tenderness.  ?   Right lower leg: No edema.  ?   Left lower leg: No edema.  ?Skin: ?   General: Skin is warm.  ?   Findings: No rash.  ?Neurological:  ?   General: No focal deficit present.  ?   Mental Status: He is alert and oriented to person, place, and time.  ?   Sensory: No sensory deficit.  ?   Motor: No weakness.  ?Psychiatric:     ?   Mood and Affect: Mood normal.     ?   Behavior: Behavior normal.  ? ? ?BP 118/64 (BP Location: Right Arm, Patient Position: Sitting, Cuff Size: Normal)   Pulse 60   Resp 18   Ht '5\' 9"'$  (1.753 m)   Wt 175 lb (79.4 kg)   SpO2 96%   BMI 25.84 kg/m?  ?Wt Readings from Last 3 Encounters:  ?02/20/22 175 lb (79.4 kg)  ?11/20/21 174 lb (78.9 kg)  ?04/17/21 177 lb (80.3 kg)  ? ? ?Lab Results  ?Component Value Date  ? TSH 7.220 (H) 11/21/2021  ? ?Lab Results  ?Component Value Date  ? WBC 6.2 11/21/2021  ? HGB 16.0 11/21/2021  ? HCT 48.2 11/21/2021  ? MCV 90 11/21/2021  ? PLT 211 11/21/2021  ? ?Lab Results  ?Component Value Date  ? NA 141 11/21/2021  ? K 4.8 11/21/2021  ? CO2 24 11/21/2021  ? GLUCOSE 102 (H) 11/21/2021  ? BUN 22 11/21/2021  ?  CREATININE 1.06 11/21/2021  ? BILITOT 0.8 11/21/2021  ? ALKPHOS 48 11/21/2021  ? AST 28 11/21/2021  ? ALT 26 11/21/2021  ? PROT 6.9 11/21/2021  ? ALBUMIN 4.6 11/21/2021  ? CALCIUM 9.2 11/21/2021  ? ANIONGAP 7 09/09/2017

## 2022-02-20 NOTE — Assessment & Plan Note (Signed)
On Crestor 5 mg twice in a week, did not tolerate daily dose in the past, but now takes almost everyday, sent new refills with QD dosing Check lipid profile 

## 2022-02-21 ENCOUNTER — Other Ambulatory Visit: Payer: Self-pay | Admitting: Internal Medicine

## 2022-02-21 DIAGNOSIS — E039 Hypothyroidism, unspecified: Secondary | ICD-10-CM

## 2022-02-21 LAB — BASIC METABOLIC PANEL
BUN/Creatinine Ratio: 15 (ref 10–24)
BUN: 15 mg/dL (ref 8–27)
CO2: 24 mmol/L (ref 20–29)
Calcium: 9.6 mg/dL (ref 8.6–10.2)
Chloride: 100 mmol/L (ref 96–106)
Creatinine, Ser: 0.97 mg/dL (ref 0.76–1.27)
Glucose: 93 mg/dL (ref 70–99)
Potassium: 4.9 mmol/L (ref 3.5–5.2)
Sodium: 141 mmol/L (ref 134–144)
eGFR: 81 mL/min/{1.73_m2} (ref >59)

## 2022-02-21 LAB — TSH+FREE T4
Free T4: 1.29 ng/dL (ref 0.82–1.77)
TSH: 3.87 u[IU]/mL (ref 0.450–4.500)

## 2022-02-21 MED ORDER — LEVOTHYROXINE SODIUM 25 MCG PO TABS: 25.0000 ug | ORAL_TABLET | Freq: Every day | ORAL | 1 refills | Status: DC

## 2022-03-03 ENCOUNTER — Ambulatory Visit (INDEPENDENT_AMBULATORY_CARE_PROVIDER_SITE_OTHER): Payer: Medicare Other

## 2022-03-03 DIAGNOSIS — Z0000 Encounter for general adult medical examination without abnormal findings: Secondary | ICD-10-CM | POA: Diagnosis not present

## 2022-03-03 NOTE — Patient Instructions (Signed)
[name] , ?Thank you for taking time to come for your Medicare Wellness Visit. I appreciate your ongoing commitment to your health goals. Please review the following plan we discussed and let me know if I can assist you in the future.  ? ?Screening recommendations/referrals: ?Colonoscopy: Complete ?Recommended yearly ophthalmology/optometry visit for glaucoma screening and checkup ?Recommended yearly dental visit for hygiene and checkup ? ?Vaccinations: ?Influenza vaccine: Complete ?Pneumococcal vaccine: Complete ?Tdap vaccine: Complete ?Shingles vaccine: Complete   ? ?Advanced directives: Yes  ? ?Conditions/risks identified: Hyperlipidemia ? ?Next appointment: 1 Year  ? ? ?[name] , ?Thank you for taking time to come for your Medicare Wellness Visit. I appreciate your ongoing commitment to your health goals. Please review the following plan we discussed and let me know if I can assist you in the future.  ? ?These are the goals we discussed: ? Goals   ? ?  Patient Stated   ?  Take care of my wife. ?  ? ?  ?  ?This is a list of the screening recommended for you and due dates:  ?Health Maintenance  ?Topic Date Due  ? COVID-19 Vaccine (4 - Booster for Moderna series) 10/29/2020  ? Flu Shot  06/03/2022  ? Tetanus Vaccine  02/15/2030  ? Pneumonia Vaccine  Completed  ? Hepatitis C Screening: USPSTF Recommendation to screen - Ages 70-79 yo.  Completed  ? Zoster (Shingles) Vaccine  Completed  ? HPV Vaccine  Aged Out  ? Colon Cancer Screening  Discontinued  ?  ?Preventive Care 76 Years and Older, Male ?Preventive care refers to lifestyle choices and visits with your health care provider that can promote health and wellness. ?What does preventive care include? ?A yearly physical exam. This is also called an annual well check. ?Dental exams once or twice a year. ?Routine eye exams. Ask your health care provider how often you should have your eyes checked. ?Personal lifestyle choices, including: ?Daily care of your teeth  and gums. ?Regular physical activity. ?Eating a healthy diet. ?Avoiding tobacco and drug use. ?Limiting alcohol use. ?Practicing safe sex. ?Taking low doses of aspirin every day. ?Taking vitamin and mineral supplements as recommended by your health care provider. ?What happens during an annual well check? ?The services and screenings done by your health care provider during your annual well check will depend on your age, overall health, lifestyle risk factors, and family history of disease. ?Counseling  ?Your health care provider may ask you questions about your: ?Alcohol use. ?Tobacco use. ?Drug use. ?Emotional well-being. ?Home and relationship well-being. ?Sexual activity. ?Eating habits. ?History of falls. ?Memory and ability to understand (cognition). ?Work and work Statistician. ?Screening  ?You may have the following tests or measurements: ?Height, weight, and BMI. ?Blood pressure. ?Lipid and cholesterol levels. These may be checked every 5 years, or more frequently if you are over 76 years old. ?Skin check. ?Lung cancer screening. You may have this screening every year starting at age 76 if you have a 30-pack-year history of smoking and currently smoke or have quit within the past 15 years. ?Fecal occult blood test (FOBT) of the stool. You may have this test every year starting at age 76. ?Flexible sigmoidoscopy or colonoscopy. You may have a sigmoidoscopy every 5 years or a colonoscopy every 10 years starting at age 76 ?Prostate cancer screening. Recommendations will vary depending on your family history and other risks. ?Hepatitis C blood test. ?Hepatitis B blood test. ?Sexually transmitted disease (STD) testing. ?Diabetes screening. This is  done by checking your blood sugar (glucose) after you have not eaten for a while (fasting). You may have this done every 1-3 years. ?Abdominal aortic aneurysm (AAA) screening. You may need this if you are a current or former smoker. ?Osteoporosis. You may be screened  starting at age 76 if you are at high risk. ?Talk with your health care provider about your test results, treatment options, and if necessary, the need for more tests. ?Vaccines  ?Your health care provider may recommend certain vaccines, such as: ?Influenza vaccine. This is recommended every year. ?Tetanus, diphtheria, and acellular pertussis (Tdap, Td) vaccine. You may need a Td booster every 10 years. ?Zoster vaccine. You may need this after age 76 ?Pneumococcal 13-valent conjugate (PCV13) vaccine. One dose is recommended after age 76. ?Pneumococcal polysaccharide (PPSV23) vaccine. One dose is recommended after age 76 ?Talk to your health care provider about which screenings and vaccines you need and how often you need them. ?This information is not intended to replace advice given to you by your health care provider. Make sure you discuss any questions you have with your health care provider. ?Document Released: 11/16/2015 Document Revised: 07/09/2016 Document Reviewed: 08/21/2015 ?Elsevier Interactive Patient Education ? 2017 [place]. ? ?Fall Prevention in the Home ?Falls can cause injuries. They can happen to people of all ages. There are many things you can do to make your home safe and to help prevent falls. ?What can I do on the outside of my home? ?Regularly fix the edges of walkways and driveways and fix any cracks. ?Remove anything that might make you trip as you walk through a door, such as a raised step or threshold. ?Trim any bushes or trees on the path to your home. ?Use bright outdoor lighting. ?Clear any walking paths of anything that might make someone trip, such as rocks or tools. ?Regularly check to see if handrails are loose or broken. Make sure that both sides of any steps have handrails. ?Any raised decks and porches should have guardrails on the edges. ?Have any leaves, snow, or ice cleared regularly. ?Use sand or salt on walking paths during winter. ?Clean up any spills in your garage  right away. This includes oil or grease spills. ?What can I do in the bathroom? ?Use night lights. ?Install grab bars by the toilet and in the tub and shower. Do not use towel bars as grab bars. ?Use non-skid mats or decals in the tub or shower. ?If you need to sit down in the shower, use a plastic, non-slip stool. ?Keep the floor dry. Clean up any water that spills on the floor as soon as it happens. ?Remove soap buildup in the tub or shower regularly. ?Attach bath mats securely with double-sided non-slip rug tape. ?Do not have throw rugs and other things on the floor that can make you trip. ?What can I do in the bedroom? ?Use night lights. ?Make sure that you have a light by your bed that is easy to reach. ?Do not use any sheets or blankets that are too big for your bed. They should not hang down onto the floor. ?Have a firm chair that has side arms. You can use this for support while you get dressed. ?Do not have throw rugs and other things on the floor that can make you trip. ?What can I do in the kitchen? ?Clean up any spills right away. ?Avoid walking on wet floors. ?Keep items that you use a lot in easy-to-reach places. ?If you need  to reach something above you, use a strong step stool that has a grab bar. ?Keep electrical cords out of the way. ?Do not use floor polish or wax that makes floors slippery. If you must use wax, use non-skid floor wax. ?Do not have throw rugs and other things on the floor that can make you trip. ?What can I do with my stairs? ?Do not leave any items on the stairs. ?Make sure that there are handrails on both sides of the stairs and use them. Fix handrails that are broken or loose. Make sure that handrails are as long as the stairways. ?Check any carpeting to make sure that it is firmly attached to the stairs. Fix any carpet that is loose or worn. ?Avoid having throw rugs at the top or bottom of the stairs. If you do have throw rugs, attach them to the floor with carpet tape. ?Make  sure that you have a light switch at the top of the stairs and the bottom of the stairs. If you do not have them, ask someone to add them for you. ?What else can I do to help prevent falls? ?Wear shoe

## 2022-03-03 NOTE — Progress Notes (Signed)
? ?Subjective:  ? Brad Cruz is a 76 y.o. male who presents for Medicare Annual/Subsequent preventive examination. ? ?Review of Systems    ?I connected with  Brad Cruz on 03/03/22 by a audio enabled telemedicine application and verified that I am speaking with the correct person using two identifiers. ? ?Patient Location: Home ? ?Provider location: Office/Clinic ? ?I discussed the limitations of evaluation and management by telemedicine. The patient expressed understanding and agreed to proceed.  ?Cardiac Risk Factors include: none ? ?   ?Objective:  ?  ?Today's Vitals  ? 03/03/22 0919 03/03/22 0920  ?PainSc: 0-No pain 0-No pain  ? ?There is no height or weight on file to calculate BMI. ? ? ?  03/03/2022  ?  9:24 AM 02/28/2021  ?  9:37 AM 02/16/2020  ?  2:07 PM 09/09/2017  ?  4:13 PM 09/09/2017  ? 12:02 PM 09/09/2017  ?  9:29 AM 03/21/2014  ?  9:07 AM  ?Advanced Directives  ?Does Patient Have a Medical Advance Directive? Yes Yes Yes Yes No Yes Patient has advance directive, copy not in chart  ?Type of Advance Directive Living will [place];Living will  [place];Living will  [place];Living will   ?Does patient want to make changes to medical advance directive? Yes (Inpatient - patient defers changing a medical advance directive at this time - Information given) No - Patient declined Yes (MAU/Ambulatory/Procedural Areas - Information given) No - Patient declined     ?Copy of [place] in Chart?   Yes - validated most recent copy scanned in chart (See row information) No - copy requested  No - copy requested   ? ? ?Current Medications (verified) ?Outpatient Encounter Medications as of 03/03/2022  ?Medication Sig  ? acetaminophen (TYLENOL) 500 MG tablet Take 500 mg by mouth every 6 (six) hours as needed.  ? Cholecalciferol 125 MCG (5000 UT) capsule Take 10,000 Units by mouth daily.  ? fexofenadine (ALLEGRA) 180 MG tablet Take 180 mg by mouth  daily.  ? levothyroxine (SYNTHROID) 25 MCG tablet Take 1 tablet (25 mcg total) by mouth daily.  ? Psyllium (METAMUCIL FIBER PO) Take 5 mLs by mouth daily. Mix with water  ? rosuvastatin (CRESTOR) 5 MG tablet Take 1 tablet (5 mg total) by mouth daily.  ? Saw Palmetto, Serenoa repens, 320 MG CAPS Take 640 mg at bedtime by mouth.   ? vitamin B-12 (CYANOCOBALAMIN) 1000 MCG tablet Take 1,000 mcg by mouth daily.  ? ?No facility-administered encounter medications on file as of 03/03/2022.  ? ? ?Allergies (verified) ?Citrus, Ibuprofen, Shellfish-derived products, and Statins  ? ?History: ?Past Medical History:  ?Diagnosis Date  ? Allergy   ? Mold, cat dander  ? Arthritis   ? cerv spine  ? Asthma   ? Childhood history  ? BPH (benign prostatic hyperplasia)   ? Cataract   ? Coronary atherosclerosis of native coronary artery   ? a. 06/2012 Myoview:  No ischemia;  b. 06/25/2012 Cath:  LM nl, LAD 50p, 91m D1 80 ost (small), D2 nl (small), LCX 30 ost, OM1 nl, RI 30 ost, RCA 50-60- ost, 341mPDA minor irregs, PL nl (small), EF 65%.   ? GERD (gastroesophageal reflux disease)   ? Hyperlipidemia   ? Hypothyroidism   ? Migraines   ? [place]monary granuloma ([place]  ? Chest CT 6/13  ? Skin cancer, basal cell   ? BCC face  ? Vasovagal syncope   ?  Vitamin D deficiency disease 08/09/2019  ? ?Past Surgical History:  ?Procedure Laterality Date  ? BIOPSY  09/09/2017  ? Procedure: BIOPSY;  Surgeon: Rogene Houston, MD;  [facility];  Service: Endoscopy;;  gastric  ? CARDIAC CATHETERIZATION    ? COLONOSCOPY    ? Deviated septum repair 1989    ? ESOPHAGOGASTRODUODENOSCOPY N/A 09/09/2017  ? Procedure: ESOPHAGOGASTRODUODENOSCOPY (EGD);  Surgeon: Rogene Houston, MD;  [facility];  Service: Endoscopy;  Laterality: N/A;  ? LEFT HEART CATHETERIZATION WITH CORONARY ANGIOGRAM N/A 06/25/2012  ? Procedure: LEFT HEART CATHETERIZATION WITH CORONARY ANGIOGRAM;  Surgeon: Minus Breeding, MD;  Location[facility] C[facility] LAB;  Service: Cardiovascular;   Laterality: N/A;  ? LUMBAR LAMINECTOMY/DECOMPRESSION MICRODISCECTOMY  11/23/2012  ? Procedure: LUMBAR LAMINECTOMY/DECOMPRESSION MICRODISCECTOMY 1 LEVEL;  Surgeon: Floyce Stakes, MD;  Location: [facility] NEURO ORS;  Service: Neurosurgery;  Laterality: Left;  Left Lumbar five-sacral one Diskectomy  ? SKIN CANCER EXCISION    ? Under left eye basal cell  ? SPINE SURGERY    ? ?Family History  ?Problem Relation Age of Onset  ? Heart disease Mother   ?     age 52  ? Transient ischemic attack Mother   ? Stroke Mother   ? Stomach cancer Sister   ? Cancer Sister   ?     age 83  ? Colon polyps Brother   ? Heart disease Brother   ? Thyroid cancer Brother   ? Alzheimer's disease Father   ?     29  ? Colon cancer Neg Hx   ? ?Social History  ? ?Socioeconomic History  ? Marital status: Married  ?  Spouse name: bettie  ? Number of children: 1  ? Years of education: 83  ? Highest education level: Professional school degree (e.g., MD, DDS, DVM, JD)  ?Occupational History  ? Occupation: Therapist, art x 5 years  ?  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  ? Occupation: attorney  retired  ?Tobacco Use  ? Smoking status: Never  ? Smokeless tobacco: Never  ?Vaping Use  ? Vaping Use: Never used  ?Substance and Sexual Activity  ? Alcohol use: No  ?  Alcohol/week: 0.0 standard drinks  ? Drug use: No  ? Sexual activity: Yes  ?  Partners: Female  ?  Birth control/protection: Post-menopausal  ?Other Topics Concern  ? Not on file  ?Social History Narrative  ? Lives with [name]  ? Married 35 years  ? Involved with church and the [place]   ? ?Social Determinants of Health  ? ?Financial Resource Strain: Low Risk   ? Difficulty of Paying Living Expenses: Not hard at all  ?Food Insecurity: No Food Insecurity  ? Worried About Charity fundraiser in the Last Year: Never true  ? Ran Out of Food in the Last Year: Never true  ?Transportation Needs: No Transportation Needs  ? Lack of Transportation (Medical): No  ? Lack of Transportation (Non-Medical): No  ?Physical Activity:  Inactive  ? Days of Exercise per Week: 0 days  ? Minutes of Exercise per Session: 0 min  ?Stress: No Stress Concern Present  ? Feeling of Stress : Not at all  ?Social Connections: Socially Isolated  ? Frequency of Communication with Friends and Family: Once a week  ? Frequency of Social Gatherings with Friends and Family: Never  ? Attends Religious Services: Never  ? Active Member of Clubs or Organizations: No  ? Attends Archivist Meetings: Never  ? Marital  Status: Married  ? ? ?Tobacco Counseling ?Counseling given: Not Answered ? ? ?Clinical Intake: ? ?Brad Cruz , ?Thank you for taking time to come for your Medicare Wellness Visit. I appreciate your ongoing commitment to your health goals. Please review the following plan we discussed and let me know if I can assist you in the future.  ? ?These are the goals we discussed: ? Goals   ? ?  Patient Stated   ?  Take care of my wife. ?  ? ?  ?  ?This is a list of the screening recommended for you and due dates:  ?Health Maintenance  ?Topic Date Due  ? COVID-19 Vaccine (4 - Booster for Moderna series) 10/29/2020  ? Flu Shot  06/03/2022  ? Tetanus Vaccine  02/15/2030  ? Pneumonia Vaccine  Completed  ? Hepatitis C Screening: USPSTF Recommendation to screen - Ages 30-79 yo.  Completed  ? Zoster (Shingles) Vaccine  Completed  ? HPV Vaccine  Aged Out  ? Colon Cancer Screening  Discontinued  ?  ?Pre-visit preparation completed: Yes ? ?Pain : No/denies pain ?Pain Score: 0-No pain ? ?  ? ?BMI - recorded: 25.83 ?Nutritional Status: BMI 25 -29 Overweight ?Nutritional Risks: None ?Diabetes: No ? ?How often do you need to have someone help you when you read instructions, pamphlets, or other written materials from your doctor or pharmacy?: 1 - Never ?What is the last grade level you completed in school?: 12+ ? ?Diabetic?No ? ?Interpreter Needed?: No ? ?  ? ? ?Activities of Daily Living ? ?  03/03/2022  ?  9:24 AM 02/28/2022  ? 12:58 PM  ?In your present state of health, do  you have any difficulty performing the following activities:  ?Hearing? 0 0  ?Vision? 1 0  ?Difficulty concentrating or making decisions? 0 0  ?Walking or climbing stairs? 0 0  ?Dressing or bathing? 0 0  ?Doing

## 2022-03-05 ENCOUNTER — Encounter (INDEPENDENT_AMBULATORY_CARE_PROVIDER_SITE_OTHER): Payer: Medicare Other | Admitting: Nurse Practitioner

## 2022-04-17 DIAGNOSIS — H35373 Puckering of macula, bilateral: Secondary | ICD-10-CM | POA: Diagnosis not present

## 2022-04-17 DIAGNOSIS — H2513 Age-related nuclear cataract, bilateral: Secondary | ICD-10-CM | POA: Diagnosis not present

## 2022-04-17 DIAGNOSIS — H3554 Dystrophies primarily involving the retinal pigment epithelium: Secondary | ICD-10-CM | POA: Diagnosis not present

## 2022-05-02 DIAGNOSIS — H35433 Paving stone degeneration of retina, bilateral: Secondary | ICD-10-CM | POA: Diagnosis not present

## 2022-05-02 DIAGNOSIS — H524 Presbyopia: Secondary | ICD-10-CM | POA: Diagnosis not present

## 2022-05-20 ENCOUNTER — Ambulatory Visit (INDEPENDENT_AMBULATORY_CARE_PROVIDER_SITE_OTHER): Payer: Medicare Other | Admitting: Internal Medicine

## 2022-05-20 ENCOUNTER — Encounter: Payer: Self-pay | Admitting: Internal Medicine

## 2022-05-20 VITALS — BP 118/78 | HR 57 | Resp 18 | Ht 69.0 in | Wt 180.4 lb

## 2022-05-20 DIAGNOSIS — E039 Hypothyroidism, unspecified: Secondary | ICD-10-CM

## 2022-05-20 DIAGNOSIS — L304 Erythema intertrigo: Secondary | ICD-10-CM

## 2022-05-20 DIAGNOSIS — R7303 Prediabetes: Secondary | ICD-10-CM

## 2022-05-20 DIAGNOSIS — E782 Mixed hyperlipidemia: Secondary | ICD-10-CM

## 2022-05-20 DIAGNOSIS — I2510 Atherosclerotic heart disease of native coronary artery without angina pectoris: Secondary | ICD-10-CM | POA: Diagnosis not present

## 2022-05-20 DIAGNOSIS — F4321 Adjustment disorder with depressed mood: Secondary | ICD-10-CM

## 2022-05-20 MED ORDER — NYSTATIN 100000 UNIT/GM EX POWD: 1.0000 | Freq: Three times a day (TID) | CUTANEOUS | 0 refills | Status: DC

## 2022-05-20 NOTE — Assessment & Plan Note (Signed)
Likely due to acute stressor in the setting of his wife's recent diagnosis of metastatic breast cancer Denies [facility] therapy referral for now - improving now 

## 2022-05-20 NOTE — Assessment & Plan Note (Signed)
On Crestor 5 mg twice in a week, did not tolerate daily dose in the past, but now takes almost everyday, sent new refills with QD dosing Check lipid profile

## 2022-05-20 NOTE — Progress Notes (Signed)
Established Patient Office Visit  Subjective:  Patient ID: Brad Cruz, male    DOB: 12/15/1945  Age: 76 y.o. MRN: [id number]  CC:  Chief Complaint  Patient presents with   Follow-up    6 month follow up chronic fatigue is better as long as patient gets enough rest     HPI Brad Cruz is a 76 y.o. male with past medical history of nonobstructive CAD, hypothyroidism and HLD who presents for f/u of his chronic medical conditions.  He takes Crestor for HLD and history of CAD.  He denies any chest pain, dyspnea or palpitations.  He follows up with Dr. Domenic Polite for history of CAD.   Hypothyroidism: He has been tolerating levothyroxine well.  He denies any recent change in weight or appetite.  He has chronic fatigue, which has improved since starting levothyroxine.  He also takes testosterone supplement from [facility].  He reports mild skin irritation and itching in the groin area for the last few weeks. Denies any noticeable rash currently.  His wife was diagnosed with metastatic breast cancer, and he had been feeling stressed with it.  He reports mild anhedonia, but is manageable and denies referral to [facility] therapy for now.  Denies any SI or HI currently.  Past Medical History:  Diagnosis Date   Allergy    Mold, cat dander   Arthritis    cerv spine   Asthma    Childhood history   BPH (benign prostatic hyperplasia)    Cataract    Coronary atherosclerosis of native coronary artery    a. 06/2012 Myoview:  No ischemia;  b. 06/25/2012 Cath:  LM nl, LAD 50p, 18m D1 80 ost (small), D2 nl (small), LCX 30 ost, OM1 nl, RI 30 ost, RCA 50-60- ost, 385mPDA minor irregs, PL nl (small), EF 65%.    GERD (gastroesophageal reflux disease)    Hyperlipidemia    Hypothyroidism    Migraines    Pulmonary granuloma (HCC)    Chest CT 6/13   Skin cancer, basal cell    BCC face   Vasovagal syncope    Vitamin D deficiency disease 08/09/2019    Past Surgical History:  Procedure Laterality  Date   BIOPSY  09/09/2017   Procedure: BIOPSY;  Surgeon: ReRogene HoustonMD;  [facility];  Service: Endoscopy;;  gastric   CARDIAC CATHETERIZATION     COLONOSCOPY     Deviated septum repair 1989     ESOPHAGOGASTRODUODENOSCOPY N/A 09/09/2017   Procedure: ESOPHAGOGASTRODUODENOSCOPY (EGD);  Surgeon: ReRogene HoustonMD;  [facility];  Service: Endoscopy;  Laterality: N/A;   LEFT HEART CATHETERIZATION WITH CORONARY ANGIOGRAM N/A 06/25/2012   Procedure: LEFT HEART CATHETERIZATION WITH CORONARY ANGIOGRAM;  Surgeon: JaMinus BreedingMD;  [facility];  Service: Cardiovascular;  Laterality: N/A;   LUMBAR LAMINECTOMY/DECOMPRESSION MICRODISCECTOMY  11/23/2012   Procedure: LUMBAR LAMINECTOMY/DECOMPRESSION MICRODISCECTOMY 1 LEVEL;  Surgeon: ErFloyce StakesMD;  Location: [facility];  Service: Neurosurgery;  Laterality: Left;  Left Lumbar five-sacral one Diskectomy   SKIN CANCER EXCISION     Under left eye basal cell   SPINE SURGERY      Family History  Problem Relation Age of Onset   Heart disease Mother        age 76 Transient ischemic attack Mother    Stroke Mother    Stomach cancer Sister    Cancer Sister        age 76  Colon polyps Brother    Heart disease Brother    Thyroid cancer Brother    Alzheimer's disease Father        82   Colon cancer Neg Hx     Social History   Socioeconomic History   Marital status: Married    Spouse name: Brad Cruz   Number of children: 1   Years of education: 25   Highest education level: Professional school degree (e.g., MD, DDS, DVM, JD)  Occupational History   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   Occupation: attorney  retired  Tobacco Use   Smoking status: Never   Smokeless tobacco: Never  Vaping Use   Vaping Use: Never used  Substance and Sexual Activity   Alcohol use: No    Alcohol/week: 0.0 standard drinks of alcohol   Drug use: No   Sexual activity: Yes    Partners: Female     Birth control/protection: Post-menopausal  Other Topics Concern   Not on file  Social History Narrative   Lives with [place]   Married 35 years   Involved with church and the [place] Determinants of Health   Financial Resource Strain: Low Risk  (03/03/2022)   Overall Financial Resource Strain (CARDIA)    Difficulty of Paying Living Expenses: Not hard at all  Food Insecurity: No Food Insecurity (03/03/2022)   Hunger Vital Sign    Worried About Running Out of Food in the Last Year: Never true    [place] in the Last Year: Never true  Transportation Needs: No Transportation Needs (03/03/2022)   PRAPARE - Hydrologist (Medical): No    Lack of Transportation (Non-Medical): No  Physical Activity: Inactive (03/03/2022)   Exercise Vital Sign    Days of Exercise per Week: 0 days    Minutes of Exercise per Session: 0 min  Stress: No Stress Concern Present (03/03/2022)   [place]    Feeling of Stress : Not at all  Social Connections: Socially Isolated (03/03/2022)   Social Connection and Isolation Panel [NHANES]    Frequency of Communication with Friends and Family: Once a week    Frequency of Social Gatherings with Friends and Family: Never    Attends Religious Services: Never    Marine scientist or Organizations: No    Attends Archivist Meetings: Never    Marital Status: Married  Human resources officer Violence: Not At Risk (03/03/2022)   Humiliation, Afraid, Rape, and Kick questionnaire    Fear of Current or Ex-Partner: No    Emotionally Abused: No    Physically Abused: No    Sexually Abused: No    Outpatient Medications Prior to Visit  Medication Sig Dispense Refill   acetaminophen (TYLENOL) 500 MG tablet Take 500 mg by mouth every 6 (six) hours as needed.     Cholecalciferol 125 MCG (5000 UT) capsule Take 10,000 Units by mouth daily.     fexofenadine (ALLEGRA) 180 MG  tablet Take 180 mg by mouth daily.     levothyroxine (SYNTHROID) 25 MCG tablet Take 1 tablet (25 mcg total) by mouth daily. 90 tablet 1   Psyllium (METAMUCIL FIBER PO) Take 5 mLs by mouth daily. Mix with water     rosuvastatin (CRESTOR) 5 MG tablet Take 1 tablet (5 mg total) by mouth daily. 90 tablet 1   Saw Palmetto, Serenoa repens, 320  MG CAPS Take 640 mg at bedtime by mouth.      vitamin B-12 (CYANOCOBALAMIN) 1000 MCG tablet Take 1,000 mcg by mouth daily.     No facility-administered medications prior to visit.    Allergies  Allergen Reactions   Citrus Hives    Avoid grapefruit, OJ   Ibuprofen Hives, Swelling and Rash   Shellfish-Derived Products Hives    Rash    Statins Other (See Comments)    ROS Review of Systems  Constitutional:  Positive for fatigue. Negative for chills and fever.  HENT:  Negative for congestion and sore throat.   Eyes:  Negative for pain and discharge.  Respiratory:  Negative for cough and shortness of breath.   Cardiovascular:  Negative for chest pain and palpitations.  Gastrointestinal:  Negative for diarrhea, nausea and vomiting.  Endocrine: Negative for polydipsia and polyuria.  Genitourinary:  Negative for dysuria and hematuria.  Musculoskeletal:  Negative for neck pain and neck stiffness.  Skin:  Negative for rash.       Itching in groin area  Neurological:  Negative for dizziness, weakness, numbness and headaches.  Psychiatric/Behavioral:  Positive for dysphoric mood. Negative for agitation and behavioral problems.       Objective:    Physical Exam Vitals reviewed.  Constitutional:      General: He is not in acute distress.    Appearance: He is not diaphoretic.  HENT:     Head: Normocephalic and atraumatic.     Nose: Nose normal.     Mouth/Throat:     Mouth: Mucous membranes are moist.  Eyes:     General: No scleral icterus.    Extraocular Movements: Extraocular movements intact.  Cardiovascular:     Rate and Rhythm: Normal rate  and regular rhythm.     Pulses: Normal pulses.     Heart sounds: Normal heart sounds. No murmur heard. Pulmonary:     Breath sounds: Normal breath sounds. No wheezing or rales.  Musculoskeletal:     Cervical back: Neck supple. No tenderness.     Right lower leg: No edema.     Left lower leg: No edema.  Skin:    General: Skin is warm.     Findings: No rash.  Neurological:     General: No focal deficit present.     Mental Status: He is alert and oriented to person, place, and time.     Sensory: No sensory deficit.     Motor: No weakness.  Psychiatric:        Mood and Affect: Mood normal.        Behavior: Behavior normal.     BP 118/78 (BP Location: Right Arm, Patient Position: Sitting, Cuff Size: Normal)   Pulse (!) 57   Resp 18   Ht 5' 9"  (1.753 m)   Wt 180 lb 6.4 oz (81.8 kg)   SpO2 96%   BMI 26.64 kg/m  Wt Readings from Last 3 Encounters:  05/20/22 180 lb 6.4 oz (81.8 kg)  02/20/22 175 lb (79.4 kg)  11/20/21 174 lb (78.9 kg)    Lab Results  Component Value Date   TSH 3.870 02/20/2022   Lab Results  Component Value Date   WBC 6.2 11/21/2021   HGB 16.0 11/21/2021   HCT 48.2 11/21/2021   MCV 90 11/21/2021   PLT 211 11/21/2021   Lab Results  Component Value Date   NA 141 02/20/2022   K 4.9 02/20/2022   CO2 24 02/20/2022   GLUCOSE  93 02/20/2022   BUN 15 02/20/2022   CREATININE 0.97 02/20/2022   BILITOT 0.8 11/21/2021   ALKPHOS 48 11/21/2021   AST 28 11/21/2021   ALT 26 11/21/2021   PROT 6.9 11/21/2021   ALBUMIN 4.6 11/21/2021   CALCIUM 9.6 02/20/2022   ANIONGAP 7 09/09/2017   EGFR 81 02/20/2022   Lab Results  Component Value Date   CHOL 197 11/21/2021   Lab Results  Component Value Date   HDL 39 (L) 11/21/2021   Lab Results  Component Value Date   LDLCALC 134 (H) 11/21/2021   Lab Results  Component Value Date   TRIG 134 11/21/2021   Lab Results  Component Value Date   CHOLHDL 5.1 (H) 11/21/2021   Lab Results  Component Value Date    HGBA1C 6.0 (H) 11/21/2021      Assessment & Plan:   Problem List Items Addressed This Visit       Cardiovascular and Mediastinum   CAD (coronary artery disease), native coronary artery    Asymptomatic currently On statin Followed by cardiology      Relevant Orders   CMP14+EGFR   Lipid Profile     Endocrine   Hypothyroidism - Primary    Lab Results  Component Value Date   TSH 3.870 02/20/2022  On levothyroxine 25 mcg QD Check TSH and free T4 Fatigue improved with thyroid supplement now      Relevant Orders   TSH + free T4     Musculoskeletal and Integument   Intertrigo    Mild skin irritation likely intertrigo due to perspiration in groin area Nystatin powder for now      Relevant Medications   nystatin (MYCOSTATIN/NYSTOP) powder     Other   Hyperlipidemia    On Crestor 5 mg twice in a week, did not tolerate daily dose in the past, but now takes almost everyday, sent new refills with QD dosing Check lipid profile      Relevant Orders   Lipid Profile   Adjustment disorder with depressed mood    Likely due to acute stressor in the setting of his wife's recent diagnosis of metastatic breast cancer Denies [facility] therapy referral for now - improving now      Other Visit Diagnoses     Prediabetes       Relevant Orders   Hemoglobin A1c       Meds ordered this encounter  Medications   nystatin (MYCOSTATIN/NYSTOP) powder    Sig: Apply 1 Application topically 3 (three) times daily.    Dispense:  15 g    Refill:  0    Follow-up: Return in about 6 months (around 11/20/2022).    Lindell Spar, MD

## 2022-05-20 NOTE — Assessment & Plan Note (Signed)
Lab Results  Component Value Date   TSH 3.870 02/20/2022   On levothyroxine 25 mcg QD Check TSH and free T4 Fatigue improved with thyroid supplement now

## 2022-05-20 NOTE — Assessment & Plan Note (Signed)
Mild skin irritation likely intertrigo due to perspiration in groin area Nystatin powder for now

## 2022-05-20 NOTE — Assessment & Plan Note (Signed)
Asymptomatic currently On statin Followed by cardiology

## 2022-05-20 NOTE — Patient Instructions (Addendum)
Please continue taking medications as prescribed.  Please use Nystatin powder for skin irritation.  Please continue to follow low salt diet and ambulate as tolerated.  Please get fasting blood tests done before the next visit.

## 2022-05-26 ENCOUNTER — Encounter: Payer: Self-pay | Admitting: Internal Medicine

## 2022-05-27 ENCOUNTER — Other Ambulatory Visit: Payer: Self-pay | Admitting: Internal Medicine

## 2022-05-27 DIAGNOSIS — L304 Erythema intertrigo: Secondary | ICD-10-CM

## 2022-05-27 MED ORDER — KETOCONAZOLE 2 % EX CREA: 1.0000 | TOPICAL_CREAM | Freq: Every day | CUTANEOUS | 0 refills | Status: DC

## 2022-06-03 ENCOUNTER — Ambulatory Visit (INDEPENDENT_AMBULATORY_CARE_PROVIDER_SITE_OTHER): Payer: Medicare Other | Admitting: Student

## 2022-06-03 ENCOUNTER — Encounter: Payer: Self-pay | Admitting: Student

## 2022-06-03 VITALS — BP 128/68 | HR 70 | Ht 69.0 in | Wt 184.0 lb

## 2022-06-03 DIAGNOSIS — E785 Hyperlipidemia, unspecified: Secondary | ICD-10-CM

## 2022-06-03 DIAGNOSIS — E039 Hypothyroidism, unspecified: Secondary | ICD-10-CM | POA: Diagnosis not present

## 2022-06-03 DIAGNOSIS — I2510 Atherosclerotic heart disease of native coronary artery without angina pectoris: Secondary | ICD-10-CM | POA: Diagnosis not present

## 2022-06-03 NOTE — Patient Instructions (Signed)
Medication Instructions:  Take Crestor 2.5 mg Daily alternating with Crestor 5 mg Daily   *If you need a refill on your cardiac medications before your next appointment, please call your pharmacy*   Lab Work: NONE  If you have labs (blood work) drawn today and your tests are completely normal, you will receive your results only by: [place] (if you have MyChart) OR A paper copy in the mail If you have any lab test that is abnormal or we need to change your treatment, we will call you to review the results.   Testing/Procedures: NONE    Follow-Up: At [facility], you and your health needs are our priority.  As part of our continuing mission to provide you with exceptional heart care, we have created designated Provider Care Teams.  These Care Teams include your primary Cardiologist (physician) and Advanced Practice Providers (APPs -  Physician Assistants and Nurse Practitioners) who all work together to provide you with the care you need, when you need it.  We recommend signing up for the patient portal called "MyChart".  Sign up information is provided on this After Visit Summary.  MyChart is used to connect with patients for Virtual Visits (Telemedicine).  Patients are able to view lab/test results, encounter notes, upcoming appointments, etc.  Non-urgent messages can be sent to your provider as well.   To learn more about what you can do with MyChart, go to [email or web address].    Your next appointment:   1 year(s)  The format for your next appointment:   In Person  Provider:   Rozann Lesches, MD    Other Instructions Thank you for choosing [facility]!    Important Information About Sugar

## 2022-06-03 NOTE — Progress Notes (Signed)
Cardiology Office Note    Date:  06/03/2022   ID:  Brad Cruz, DOB [birth year], MRN [id number]  PCP:  Lindell Spar, MD  Cardiologist: Rozann Lesches, MD    Chief Complaint  Patient presents with   Follow-up    Annual Visit    History of Present Illness:    Brad Cruz is a 76 y.o. 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  He was last examined by Dr. Domenic Polite in 10/2020 and denied any recent anginal symptoms at that time. He did have a history of statin intolerance but was tolerating Crestor 5 mg once weekly and was continued on this.  In talking with the patient today, he reports overall feeling well since his last office visit. He has been under increased stress as his wife is currently battling stage IV lung cancer. He is doing more chores around the house but denies any recent chest pain or dyspnea on exertion with this. He climbs 16 steps up to the second floor frequently and denies any anginal symptoms with this. No specific orthopnea, PND, pitting edema or palpitations. He has been taking Crestor 2.5 mg daily as he reports having myalgias with higher dosing in the past.   Past Medical History:  Diagnosis Date   Allergy    Mold, cat dander   Arthritis    cerv spine   Asthma    Childhood history   BPH (benign prostatic hyperplasia)    Cataract    Coronary atherosclerosis of native coronary artery    a. 06/2012 Myoview:  No ischemia;  b. 06/25/2012 Cath:  LM nl, LAD 50p, 34m D1 80 ost (small), D2 nl (small), LCX 30 ost, OM1 nl, RI 30 ost, RCA 50-60- ost, 367mPDA minor irregs, PL nl (small), EF 65%.    GERD (gastroesophageal reflux disease)    Hyperlipidemia    Hypothyroidism    Migraines    Pulmonary granuloma (HCC)    Chest CT 6/13   Skin cancer, basal cell    BCC face   Vasovagal syncope    Vitamin D deficiency disease 08/09/2019     Past Surgical History:  Procedure Laterality Date   BIOPSY  09/09/2017   Procedure: BIOPSY;  Surgeon: ReRogene HoustonMD;  Location: [facility];  Service: Endoscopy;;  gastric   CARDIAC CATHETERIZATION     COLONOSCOPY     Deviated septum repair 1989     ESOPHAGOGASTRODUODENOSCOPY N/A 09/09/2017   Procedure: ESOPHAGOGASTRODUODENOSCOPY (EGD);  Surgeon: ReRogene HoustonMD;  Location: [facility];  Service: Endoscopy;  Laterality: N/A;   LEFT HEART CATHETERIZATION WITH CORONARY ANGIOGRAM N/A 06/25/2012   Procedure: LEFT HEART CATHETERIZATION WITH CORONARY ANGIOGRAM;  Surgeon: JaMinus BreedingMD;  [facility];  Service: Cardiovascular;  Laterality: N/A;   LUMBAR LAMINECTOMY/DECOMPRESSION MICRODISCECTOMY  11/23/2012   Procedure: LUMBAR LAMINECTOMY/DECOMPRESSION MICRODISCECTOMY 1 LEVEL;  Surgeon: ErFloyce StakesMD;  Location: [facility];  Service: Neurosurgery;  Laterality: Left;  Left Lumbar five-sacral one Diskectomy   SKIN CANCER EXCISION     Under left eye basal cell   SPINE SURGERY      Current Medications: Outpatient Medications Prior to Visit  Medication Sig Dispense Refill   acetaminophen (TYLENOL) 500 MG tablet Take 500 mg by mouth every 6 (six) hours as needed.     Cholecalciferol 125 MCG (5000 UT) capsule  Take 10,000 Units by mouth daily.     fexofenadine (ALLEGRA) 180 MG tablet Take 180 mg by mouth daily.     ketoconazole (NIZORAL) 2 % cream Apply 1 Application topically daily. 15 g 0   levothyroxine (SYNTHROID) 25 MCG tablet Take 1 tablet (25 mcg total) by mouth daily. 90 tablet 1   nystatin (MYCOSTATIN/NYSTOP) powder Apply 1 Application topically 3 (three) times daily. 15 g 0   Psyllium (METAMUCIL FIBER PO) Take 5 mLs by mouth daily. Mix with water     rosuvastatin (CRESTOR) 5 MG tablet Take 1 tablet (5 mg total) by mouth daily. 90 tablet 1   Saw Palmetto, Serenoa repens, 320 MG CAPS Take 640 mg at bedtime by mouth.      vitamin B-12 (CYANOCOBALAMIN)  1000 MCG tablet Take 1,000 mcg by mouth daily.     No facility-administered medications prior to visit.     Allergies:   Citrus, Ibuprofen, Shellfish-derived products, and Statins   Social History   Socioeconomic History   Marital status: Married    Spouse name: bettie   Number of children: 1   Years of education: 19   Highest education level: Professional school degree (e.g., MD, DDS, DVM, JD)  Occupational History   Occupation: [employer] x 5 years    Comment: 49 years reserves   Occupation: attorney  retired  Tobacco Use   Smoking status: Never   Smokeless tobacco: Never  Vaping Use   Vaping Use: Never used  Substance and Sexual Activity   Alcohol use: No    Alcohol/week: 0.0 standard drinks of alcohol   Drug use: No   Sexual activity: Yes    Partners: Female    Birth control/protection: Post-menopausal  Other Topics Concern   Not on file  Social History Narrative   Lives with [name]   Married 35 years   Involved with church and the [place] Determinants of Health   Financial Resource Strain: Low Risk  (03/03/2022)   Overall Financial Resource Strain (CARDIA)    Difficulty of Paying Living Expenses: Not hard at all  Food Insecurity: No Food Insecurity (03/03/2022)   Hunger Vital Sign    Worried About Running Out of Food in the Last Year: Never true    [place] in the Last Year: Never true  Transportation Needs: No Transportation Needs (03/03/2022)   PRAPARE - Hydrologist (Medical): No    Lack of Transportation (Non-Medical): No  Physical Activity: Inactive (03/03/2022)   Exercise Vital Sign    Days of Exercise per Week: 0 days    Minutes of Exercise per Session: 0 min  Stress: No Stress Concern Present (03/03/2022)   [place]    Feeling of Stress : Not at all  Social Connections: Socially Isolated (03/03/2022)   Social Connection and Isolation Panel [NHANES]     Frequency of Communication with Friends and Family: Once a week    Frequency of Social Gatherings with Friends and Family: Never    Attends Religious Services: Never    Marine scientist or Organizations: No    Attends Music therapist: Never    Marital Status: Married     Family History:  The patient's family history includes Alzheimer's disease in his father; Cancer in his sister; Colon polyps in his brother; Heart disease in his brother and mother; Stomach cancer in his sister; Stroke in his  mother; Thyroid cancer in his brother; Transient ischemic attack in his mother.   Review of Systems:    Please see the history of present illness.     All other systems reviewed and are otherwise negative except as noted above.   Physical Exam:    VS:  BP 128/68   Pulse 70   Ht '5\' 9"'$  (1.753 m)   Wt 184 lb (83.5 kg)   SpO2 97%   BMI 27.17 kg/m    General: Well developed, well nourished,male appearing in no acute distress. Head: Normocephalic, atraumatic. Neck: No carotid bruits. JVD not elevated.  Lungs: Respirations regular and unlabored, without wheezes or rales.  Heart: Regular rate and rhythm. No S3 or S4.  No murmur, no rubs, or gallops appreciated. Abdomen: Appears non-distended. No obvious abdominal masses. Msk:  Strength and tone appear normal for age. No obvious joint deformities or effusions. Extremities: No clubbing or cyanosis. No pitting edema.  Distal pedal pulses are 2+ bilaterally. Neuro: Alert and oriented X 3. Moves all extremities spontaneously. No focal deficits noted. Psych:  Responds to questions appropriately with a normal affect. Skin: No rashes or lesions noted  Wt Readings from Last 3 Encounters:  06/03/22 184 lb (83.5 kg)  05/20/22 180 lb 6.4 oz (81.8 kg)  02/20/22 175 lb (79.4 kg)      Studies/Labs Reviewed:   EKG:  EKG is ordered today.  The ekg ordered today demonstrates NSR, HR 66 with no acute ST changes.   Recent  Labs: 11/21/2021: ALT 26; Hemoglobin 16.0; Platelets 211 02/20/2022: BUN 15; Creatinine, Ser 0.97; Potassium 4.9; Sodium 141; TSH 3.870   Lipid Panel    Component Value Date/Time   CHOL 197 11/21/2021 0808   TRIG 134 11/21/2021 0808   HDL 39 (L) 11/21/2021 0808   CHOLHDL 5.1 (H) 11/21/2021 0808   CHOLHDL 3.7 02/28/2021 0958   LDLCALC 134 (H) 11/21/2021 0808   LDLCALC 99 02/28/2021 0958    Additional studies/ records that were reviewed today include:   Echocardiogram: 09/2017 Study Conclusions   - Left ventricle: The cavity size was normal. Wall thickness was    increased in a pattern of mild LVH. Systolic function was normal.    The estimated ejection fraction was in the range of 55% to 60%.    Wall motion was normal; there were no regional wall motion    abnormalities. Left ventricular diastolic function parameters    were normal.  - Mitral valve: There was mild regurgitation.  - Right atrium: Central venous pressure (est): 8 mm Hg.  - Tricuspid valve: There was trivial regurgitation.  - Pulmonary arteries: PA peak pressure: 33 mm Hg (S).  - Pericardium, extracardiac: There was no pericardial effusion.   Impressions:   - Mild LVH with LVEF 55-60% and normal diastolic function. Mild    mitral regurgitation. Trivial tricuspid regurgitation with    estimated PASP 33 mmHg.   Assessment:    1. Coronary artery disease involving native coronary artery of native heart without angina pectoris   2. Hyperlipidemia LDL goal <70   3. Hypothyroidism, unspecified type      Plan:   In order of problems listed above:  1. CAD - He had nonobstructive CAD by cath in 2013 and EKG today is without acute ST changes. He remains active at baseline and denies any recent anginal symptoms with this. He has not been on ASA given nonobstructive disease by prior catheterization but has remained on statin therapy. He is currently  taking Crestor 2.5 mg daily and will plan to titrate as outlined  below. - We reviewed warning signs to monitor for and I encouraged him to make [place] aware if he develops any concerning anginal symptoms. Would have a low threshold for repeat ischemic evaluation with a Coronary CT or Lexiscan Myoview given his known CAD.  2. HLD - FLP in 11/2021 showed total cholesterol 197, triglycerides 134, HDL 39 and LDL 134. He does try to follow a heart healthy diet. He was previously intolerant to higher intensity statin therapy and has been taking Crestor 2.5 mg daily. He will attempt to titrate this to 5 mg daily alternating with 2.5 mg daily. If LDL remains above goal or he is unable to tolerate this, would recommend the addition of Zetia.  3. Hypothyroidism - Followed by his PCP. TSH was at 3.870 in 02/2022. He remains on Synthroid 25 mcg daily.   Medication Adjustments/Labs and Tests Ordered: Current medicines are reviewed at length with the patient today.  Concerns regarding medicines are outlined above.  Medication changes, Labs and Tests ordered today are listed in the Patient Instructions below. Patient Instructions  Medication Instructions:  Take Crestor 2.5 mg Daily alternating with Crestor 5 mg Daily   *If you need a refill on your cardiac medications before your next appointment, please call your pharmacy*   Lab Work: NONE  If you have labs (blood work) drawn today and your tests are completely normal, you will receive your results only by: [place] (if you have MyChart) OR A paper copy in the mail If you have any lab test that is abnormal or we need to change your treatment, we will call you to review the results.   Testing/Procedures: NONE    Follow-Up: At [facility], you and your health needs are our priority.  As part of our continuing mission to provide you with exceptional heart care, we have created designated Provider Care Teams.  These Care Teams include your primary Cardiologist (physician) and Advanced Practice Providers (APPs  -  Physician Assistants and Nurse Practitioners) who all work together to provide you with the care you need, when you need it.  We recommend signing up for the patient portal called "MyChart".  Sign up information is provided on this After Visit Summary.  MyChart is used to connect with patients for Virtual Visits (Telemedicine).  Patients are able to view lab/test results, encounter notes, upcoming appointments, etc.  Non-urgent messages can be sent to your provider as well.   To learn more about what you can do with MyChart, go to [email or web address].    Your next appointment:   1 year(s)  The format for your next appointment:   In Person  Provider:   Rozann Lesches, MD    Other Instructions Thank you for choosing [facility]!    Important Information About Sugar         Signed, Erma Heritage, PA-C  06/03/2022 5:31 PM    [facility] [address] [place], [place] [address] Phone: [phone] Fax: [phone]

## 2022-06-09 ENCOUNTER — Other Ambulatory Visit: Payer: Self-pay | Admitting: Student

## 2022-06-09 MED ORDER — EZETIMIBE 10 MG PO TABS: 10.0000 mg | ORAL_TABLET | Freq: Every day | ORAL | 2 refills | Status: DC

## 2022-08-03 ENCOUNTER — Encounter: Payer: Self-pay | Admitting: Internal Medicine

## 2022-08-03 ENCOUNTER — Other Ambulatory Visit: Payer: Self-pay | Admitting: Internal Medicine

## 2022-08-03 DIAGNOSIS — E039 Hypothyroidism, unspecified: Secondary | ICD-10-CM

## 2022-08-22 ENCOUNTER — Ambulatory Visit: Payer: Medicare Other | Admitting: Internal Medicine

## 2022-09-11 ENCOUNTER — Other Ambulatory Visit: Payer: Self-pay

## 2022-09-11 MED ORDER — EZETIMIBE 10 MG PO TABS: 10.0000 mg | ORAL_TABLET | Freq: Every day | ORAL | 2 refills | Status: DC

## 2022-09-14 ENCOUNTER — Encounter (INDEPENDENT_AMBULATORY_CARE_PROVIDER_SITE_OTHER): Payer: Self-pay | Admitting: Gastroenterology

## 2022-10-23 DIAGNOSIS — H35373 Puckering of macula, bilateral: Secondary | ICD-10-CM | POA: Diagnosis not present

## 2022-10-23 DIAGNOSIS — H3554 Dystrophies primarily involving the retinal pigment epithelium: Secondary | ICD-10-CM | POA: Diagnosis not present

## 2022-10-23 DIAGNOSIS — H31093 Other chorioretinal scars, bilateral: Secondary | ICD-10-CM | POA: Diagnosis not present

## 2022-10-23 DIAGNOSIS — H2513 Age-related nuclear cataract, bilateral: Secondary | ICD-10-CM | POA: Diagnosis not present

## 2022-11-13 DIAGNOSIS — R7303 Prediabetes: Secondary | ICD-10-CM | POA: Diagnosis not present

## 2022-11-13 DIAGNOSIS — E039 Hypothyroidism, unspecified: Secondary | ICD-10-CM | POA: Diagnosis not present

## 2022-11-13 DIAGNOSIS — E782 Mixed hyperlipidemia: Secondary | ICD-10-CM | POA: Diagnosis not present

## 2022-11-13 DIAGNOSIS — I2510 Atherosclerotic heart disease of native coronary artery without angina pectoris: Secondary | ICD-10-CM | POA: Diagnosis not present

## 2022-11-14 LAB — CMP14+EGFR
ALT: 22 IU/L (ref 0–44)
AST: 20 IU/L (ref 0–40)
Albumin/Globulin Ratio: 1.9 (ref 1.2–2.2)
Albumin: 4.6 g/dL (ref 3.8–4.8)
Alkaline Phosphatase: 55 IU/L (ref 44–121)
BUN/Creatinine Ratio: 20 (ref 10–24)
BUN: 20 mg/dL (ref 8–27)
Bilirubin Total: 0.8 mg/dL (ref 0.0–1.2)
CO2: 24 mmol/L (ref 20–29)
Calcium: 9.4 mg/dL (ref 8.6–10.2)
Chloride: 101 mmol/L (ref 96–106)
Creatinine, Ser: 1.02 mg/dL (ref 0.76–1.27)
Globulin, Total: 2.4 g/dL (ref 1.5–4.5)
Glucose: 97 mg/dL (ref 70–99)
Potassium: 4.6 mmol/L (ref 3.5–5.2)
Sodium: 140 mmol/L (ref 134–144)
Total Protein: 7 g/dL (ref 6.0–8.5)
eGFR: 76 mL/min/{1.73_m2} (ref >59)

## 2022-11-14 LAB — LIPID PANEL
Chol/HDL Ratio: 5.6 ratio — ABNORMAL HIGH (ref 0.0–5.0)
Cholesterol, Total: 207 mg/dL — ABNORMAL HIGH (ref 100–199)
HDL: 37 mg/dL — ABNORMAL LOW (ref >39)
LDL Chol Calc (NIH): 141 mg/dL — ABNORMAL HIGH (ref 0–99)
Triglycerides: 160 mg/dL — ABNORMAL HIGH (ref 0–149)
VLDL Cholesterol Cal: 29 mg/dL (ref 5–40)

## 2022-11-14 LAB — TSH+FREE T4
Free T4: 1.28 ng/dL (ref 0.82–1.77)
TSH: 6.48 u[IU]/mL — ABNORMAL HIGH (ref 0.450–4.500)

## 2022-11-14 LAB — HEMOGLOBIN A1C
Est. average glucose Bld gHb Est-mCnc: 120 mg/dL
Hgb A1c MFr Bld: 5.8 % — ABNORMAL HIGH (ref 4.8–5.6)

## 2022-11-18 DIAGNOSIS — D2272 Melanocytic nevi of left lower limb, including hip: Secondary | ICD-10-CM | POA: Diagnosis not present

## 2022-11-18 DIAGNOSIS — L905 Scar conditions and fibrosis of skin: Secondary | ICD-10-CM | POA: Diagnosis not present

## 2022-11-18 DIAGNOSIS — Z85828 Personal history of other malignant neoplasm of skin: Secondary | ICD-10-CM | POA: Diagnosis not present

## 2022-11-18 DIAGNOSIS — L821 Other seborrheic keratosis: Secondary | ICD-10-CM | POA: Diagnosis not present

## 2022-11-18 DIAGNOSIS — B351 Tinea unguium: Secondary | ICD-10-CM | POA: Diagnosis not present

## 2022-11-18 DIAGNOSIS — D2271 Melanocytic nevi of right lower limb, including hip: Secondary | ICD-10-CM | POA: Diagnosis not present

## 2022-11-18 DIAGNOSIS — D1801 Hemangioma of skin and subcutaneous tissue: Secondary | ICD-10-CM | POA: Diagnosis not present

## 2022-11-18 DIAGNOSIS — D225 Melanocytic nevi of trunk: Secondary | ICD-10-CM | POA: Diagnosis not present

## 2022-11-19 ENCOUNTER — Ambulatory Visit (INDEPENDENT_AMBULATORY_CARE_PROVIDER_SITE_OTHER): Payer: Medicare Other | Admitting: Internal Medicine

## 2022-11-19 ENCOUNTER — Encounter: Payer: Self-pay | Admitting: Internal Medicine

## 2022-11-19 VITALS — BP 125/71 | HR 75 | Ht 69.0 in | Wt 184.2 lb

## 2022-11-19 DIAGNOSIS — E782 Mixed hyperlipidemia: Secondary | ICD-10-CM | POA: Diagnosis not present

## 2022-11-19 DIAGNOSIS — L600 Ingrowing nail: Secondary | ICD-10-CM | POA: Diagnosis not present

## 2022-11-19 DIAGNOSIS — E039 Hypothyroidism, unspecified: Secondary | ICD-10-CM | POA: Diagnosis not present

## 2022-11-19 DIAGNOSIS — I2510 Atherosclerotic heart disease of native coronary artery without angina pectoris: Secondary | ICD-10-CM

## 2022-11-19 DIAGNOSIS — R5382 Chronic fatigue, unspecified: Secondary | ICD-10-CM

## 2022-11-19 DIAGNOSIS — N138 Other obstructive and reflux uropathy: Secondary | ICD-10-CM | POA: Diagnosis not present

## 2022-11-19 DIAGNOSIS — N401 Enlarged prostate with lower urinary tract symptoms: Secondary | ICD-10-CM

## 2022-11-19 DIAGNOSIS — F4321 Adjustment disorder with depressed mood: Secondary | ICD-10-CM | POA: Diagnosis not present

## 2022-11-19 MED ORDER — LEVOTHYROXINE SODIUM 50 MCG PO TABS: 50.0000 ug | ORAL_TABLET | Freq: Every day | ORAL | 1 refills | Status: DC

## 2022-11-19 MED ORDER — ROSUVASTATIN CALCIUM 5 MG PO TABS: 5.0000 mg | ORAL_TABLET | ORAL | 1 refills | Status: DC

## 2022-11-19 NOTE — Patient Instructions (Addendum)
Please start taking Levothyroxine 50 mcg once daily.  Please start taking Crestor at least every other day.  Please continue taking other medications as prescribed.

## 2022-11-21 NOTE — Progress Notes (Addendum)
Established Patient Office Visit  Subjective:  Patient ID: Brad Cruz, male    DOB: 1945-12-04  Age: 77 y.o. MRN: [id number]  CC:  Chief Complaint  Patient presents with   Hypothyroidism    Patient is having pain in left big toe    HPI Brad Cruz is a 77 y.o. male with past medical history of nonobstructive CAD, hypothyroidism and HLD who presents for f/u of his chronic medical conditions.  He takes Zetia for HLD and history of CAD. He was tolerating Crestor QOD, but agrees to start taking it again.  He denies any chest pain, dyspnea or palpitations.  He follows up with Dr. Diona Browner for history of CAD.  Hypothyroidism: He has been tolerating levothyroxine well.  He denies any recent change in weight or appetite.  He has chronic fatigue, which has improved since starting levothyroxine.  He also takes testosterone supplement from [facility].  He reports pain in the left toe and mild swelling around toenail.  He has tried to trim the toenail, but it grows the same way and causes pain.  Denies any yellowish discoloration.  His wife was diagnosed with metastatic lung cancer, and he had been feeling stressed with it.  He reports mild anhedonia, but is manageable and denied referral to [facility] therapy for now.  Denies any SI or HI currently.  Past Medical History:  Diagnosis Date   Allergy    Mold, cat dander   Arthritis    cerv spine   Asthma    Childhood history   BPH (benign prostatic hyperplasia)    Cataract    Coronary atherosclerosis of native coronary artery    a. 06/2012 Myoview:  No ischemia;  b. 06/25/2012 Cath:  LM nl, LAD 50p, 11m, D1 80 ost (small), D2 nl (small), LCX 30 ost, OM1 nl, RI 30 ost, RCA 50-60- ost, 29m, PDA minor irregs, PL nl (small), EF 65%.    GERD (gastroesophageal reflux disease)    Hyperlipidemia    Hypothyroidism    Migraines    Pulmonary granuloma (HCC)    Chest CT 6/13   Skin cancer, basal cell    BCC face   Vasovagal syncope    Vitamin D  deficiency disease 08/09/2019    Past Surgical History:  Procedure Laterality Date   BIOPSY  09/09/2017   Procedure: BIOPSY;  Surgeon: Malissa Hippo, MD;  Location: [facility];  Service: Endoscopy;;  gastric   CARDIAC CATHETERIZATION     COLONOSCOPY     Deviated septum repair 1989     ESOPHAGOGASTRODUODENOSCOPY N/A 09/09/2017   Procedure: ESOPHAGOGASTRODUODENOSCOPY (EGD);  Surgeon: Malissa Hippo, MD;  Location: [facility];  Service: Endoscopy;  Laterality: N/A;   LEFT HEART CATHETERIZATION WITH CORONARY ANGIOGRAM N/A 06/25/2012   Procedure: LEFT HEART CATHETERIZATION WITH CORONARY ANGIOGRAM;  Surgeon: [name], MD;  Location: [facility] CATH LAB;  Service: Cardiovascular;  Laterality: N/A;   LUMBAR LAMINECTOMY/DECOMPRESSION MICRODISCECTOMY  11/23/2012   Procedure: LUMBAR LAMINECTOMY/DECOMPRESSION MICRODISCECTOMY 1 LEVEL;  Surgeon: Karn Cassis, MD;  Location: [facility];  Service: Neurosurgery;  Laterality: Left;  Left Lumbar five-sacral one Diskectomy   SKIN CANCER EXCISION     Under left eye basal cell   SPINE SURGERY      Family History  Problem Relation Age of Onset   Heart disease Mother        age 72   Transient ischemic attack Mother    Stroke Mother    Stomach  cancer Sister    Cancer Sister        age 40   Colon polyps Brother    Heart disease Brother    Thyroid cancer Brother    Alzheimer's disease Father        51   Colon cancer Neg Hx     Social History   Socioeconomic History   Marital status: Married    Spouse name: bettie   Number of children: 1   Years of education: 19   Highest education level: Professional school degree (e.g., MD, DDS, DVM, JD)  Occupational History   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  Tobacco Use   Smoking status: Never   Smokeless tobacco: Never  Vaping Use   Vaping status: Never Used  Substance and Sexual Activity   Alcohol use: No    Alcohol/week: 0.0  standard drinks of alcohol   Drug use: No   Sexual activity: Yes    Partners: Female    Birth control/protection: Post-menopausal  Other Topics Concern   Not on file  Social History Narrative   Lives with [place]   Married 35 years   Involved with church and the [employer]    Social Determinants of Health   Financial Resource Strain: Low Risk  (03/09/2023)   Overall Financial Resource Strain (CARDIA)    Difficulty of Paying Living Expenses: Not hard at all  Food Insecurity: No Food Insecurity (03/09/2023)   Hunger Vital Sign    Worried About Running Out of Food in the Last Year: Never true    Ran Out of Food in the Last Year: Never true  Transportation Needs: No Transportation Needs (03/09/2023)   PRAPARE - Administrator, Civil Service (Medical): No    Lack of Transportation (Non-Medical): No  Physical Activity: Insufficiently Active (03/09/2023)   Exercise Vital Sign    Days of Exercise per Week: 5 days    Minutes of Exercise per Session: 20 min  Stress: No Stress Concern Present (03/09/2023)   [employer] of Occupational Health - Occupational Stress Questionnaire    Feeling of Stress : Not at all  Social Connections: Moderately Integrated (03/09/2023)   Social Connection and Isolation Panel [NHANES]    Frequency of Communication with Friends and Family: Once a week    Frequency of Social Gatherings with Friends and Family: More than three times a week    Attends Religious Services: More than 4 times per year    Active Member of [employer] or Organizations: No    Attends Banker Meetings: Never    Marital Status: Married  Catering manager Violence: Not At Risk (03/09/2023)   Humiliation, Afraid, Rape, and Kick questionnaire    Fear of Current or Ex-Partner: No    Emotionally Abused: No    Physically Abused: No    Sexually Abused: No    Outpatient Medications Prior to Visit  Medication Sig Dispense Refill   fexofenadine (ALLEGRA) 180 MG tablet Take 180 mg by  mouth as needed for allergies or rhinitis.     Saw Palmetto, Serenoa repens, 320 MG CAPS Take 640 mg at bedtime by mouth.      vitamin B-12 (CYANOCOBALAMIN) 1000 MCG tablet Take 1,000 mcg by mouth daily.     ezetimibe (ZETIA) 10 MG tablet Take 1 tablet (10 mg total) by mouth daily. (Patient not taking: Reported on 06/10/2023) 90 tablet 2   levothyroxine (SYNTHROID)  25 MCG tablet TAKE 1 TABLET DAILY 90 tablet 3   acetaminophen (TYLENOL) 500 MG tablet Take 500 mg by mouth every 6 (six) hours as needed.     Cholecalciferol 125 MCG (5000 UT) capsule Take 10,000 Units by mouth daily.     ketoconazole (NIZORAL) 2 % cream Apply 1 Application topically daily. 15 g 0   nystatin (MYCOSTATIN/NYSTOP) powder Apply 1 Application topically 3 (three) times daily. 15 g 0   Psyllium (METAMUCIL FIBER PO) Take 5 mLs by mouth daily. Mix with water     rosuvastatin (CRESTOR) 5 MG tablet Take 1 tablet (5 mg total) by mouth daily. 90 tablet 1   No facility-administered medications prior to visit.    Allergies  Allergen Reactions   Citrus Hives    Avoid grapefruit, OJ   Ibuprofen Hives, Swelling and Rash   Shellfish-Derived Products Hives    Rash    Statins Other (See Comments)    ROS Review of Systems  Constitutional:  Positive for fatigue. Negative for chills and fever.  HENT:  Negative for congestion and sore throat.   Eyes:  Negative for pain and discharge.  Respiratory:  Negative for cough and shortness of breath.   Cardiovascular:  Negative for chest pain and palpitations.  Gastrointestinal:  Negative for diarrhea, nausea and vomiting.  Endocrine: Negative for polydipsia and polyuria.  Genitourinary:  Negative for dysuria and hematuria.  Musculoskeletal:  Negative for neck pain and neck stiffness.  Skin:  Negative for rash.  Neurological:  Negative for dizziness, weakness, numbness and headaches.  Psychiatric/Behavioral:  Positive for dysphoric mood. Negative for agitation and behavioral problems.        Objective:    Physical Exam Vitals reviewed.  Constitutional:      General: He is not in acute distress.    Appearance: He is not diaphoretic.  HENT:     Head: Normocephalic and atraumatic.     Nose: Nose normal.     Mouth/Throat:     Mouth: Mucous membranes are moist.  Eyes:     General: No scleral icterus.    Extraocular Movements: Extraocular movements intact.  Cardiovascular:     Rate and Rhythm: Normal rate and regular rhythm.     Pulses: Normal pulses.     Heart sounds: Normal heart sounds. No murmur heard. Pulmonary:     Breath sounds: Normal breath sounds. No wheezing or rales.  Musculoskeletal:     Cervical back: Neck supple. No tenderness.     Right lower leg: No edema.     Left lower leg: No edema.  Feet:     Left foot:     Toenail Condition: Left toenails are ingrown.  Skin:    General: Skin is warm.     Findings: No rash.  Neurological:     General: No focal deficit present.     Mental Status: He is alert and oriented to person, place, and time.     Sensory: No sensory deficit.     Motor: No weakness.  Psychiatric:        Mood and Affect: Mood normal.        Behavior: Behavior normal.     BP 125/71 (BP Location: Left Arm, Patient Position: Sitting, Cuff Size: Normal)   Pulse 75   Ht 5\' 9"  (1.753 m)   Wt 184 lb 3.2 oz (83.6 kg)   SpO2 97%   BMI 27.20 kg/m  Wt Readings from Last 3 Encounters:  09/01/23 184 lb 9.6  oz (83.7 kg)  06/10/23 185 lb (83.9 kg)  03/04/23 183 lb 12.8 oz (83.4 kg)    Lab Results  Component Value Date   TSH 5.530 (H) 08/26/2023   Lab Results  Component Value Date   WBC 6.2 11/21/2021   HGB 16.0 11/21/2021   HCT 48.2 11/21/2021   MCV 90 11/21/2021   PLT 211 11/21/2021   Lab Results  Component Value Date   NA 138 08/26/2023   K 4.3 08/26/2023   CO2 23 08/26/2023   GLUCOSE 95 08/26/2023   BUN 24 08/26/2023   CREATININE 1.08 08/26/2023   BILITOT 0.9 08/26/2023   ALKPHOS 54 08/26/2023   AST 22 08/26/2023    ALT 22 08/26/2023   PROT 6.9 08/26/2023   ALBUMIN 4.6 08/26/2023   CALCIUM 9.2 08/26/2023   ANIONGAP 7 09/09/2017   EGFR 71 08/26/2023   Lab Results  Component Value Date   CHOL 155 08/26/2023   Lab Results  Component Value Date   HDL 38 (L) 08/26/2023   Lab Results  Component Value Date   LDLCALC 92 08/26/2023   Lab Results  Component Value Date   TRIG 140 08/26/2023   Lab Results  Component Value Date   CHOLHDL 4.1 08/26/2023   Lab Results  Component Value Date   HGBA1C 5.8 (H) 11/13/2022      Assessment & Plan:   Problem List Items Addressed This Visit       Cardiovascular and Mediastinum   CAD (coronary artery disease), native coronary artery    Asymptomatic currently Needs to restart statin Followed by cardiology      Relevant Orders   CMP14+EGFR (Completed)     Endocrine   Hypothyroidism - Primary    Lab Results  Component Value Date   TSH 6.480 (H) 11/13/2022   On levothyroxine 25 mcg QD He has persistent fatigue, increased dose of levothyroxine to 50 mcg QD Check TSH and free T4 Fatigue had improved with thyroid supplement      Relevant Orders   TSH + free T4 (Completed)     Genitourinary   BPH with obstruction/lower urinary tract symptoms    Takes Saw Palmetto        Other   Hyperlipidemia   Relevant Orders   Lipid Profile (Completed)   Chronic fatigue    Was on testosterone therapy in the past Chart review shows normal testosterone levels in the past Check CMP, TSH, free T4 MDD can also cause fatigue - he has been feeling caregiver stress      Adjustment disorder with depressed mood    Likely due to acute stressor in the setting of his wife's recent diagnosis of metastatic lung cancer Denies [facility] therapy referral for now - improving now      Other Visit Diagnoses     Ingrown toenail of left foot       Relevant Orders   Ambulatory referral to Podiatry       Meds ordered this encounter  Medications   DISCONTD:  levothyroxine (SYNTHROID) 50 MCG tablet    Sig: Take 1 tablet (50 mcg total) by mouth daily.    Dispense:  90 tablet    Refill:  1   DISCONTD: rosuvastatin (CRESTOR) 5 MG tablet    Sig: Take 1 tablet (5 mg total) by mouth every other day.    Dispense:  45 tablet    Refill:  1    Follow-up: Return in about 4 months (around 03/20/2023) for Hypothyroidism.  Anabel Halon, MD

## 2022-11-21 NOTE — Assessment & Plan Note (Signed)
[place]

## 2022-11-21 NOTE — Assessment & Plan Note (Signed)
Was on testosterone therapy in the past Chart review shows normal testosterone levels in the past Check CMP, TSH, free T4 MDD can also cause fatigue - he has been feeling caregiver stress

## 2022-11-21 NOTE — Assessment & Plan Note (Addendum)
Likely due to acute stressor in the setting of his wife's recent diagnosis of metastatic lung cancer Denies [facility] therapy referral for now - improving now

## 2022-11-21 NOTE — Assessment & Plan Note (Signed)
Lab Results  Component Value Date   TSH 6.480 (H) 11/13/2022   On levothyroxine 25 mcg QD He has persistent fatigue, increased dose of levothyroxine to 50 mcg QD Check TSH and free T4 Fatigue had improved with thyroid supplement

## 2022-11-21 NOTE — Assessment & Plan Note (Addendum)
Asymptomatic currently Needs to restart statin Followed by cardiology

## 2022-11-25 ENCOUNTER — Ambulatory Visit (INDEPENDENT_AMBULATORY_CARE_PROVIDER_SITE_OTHER): Payer: Medicare Other | Admitting: Podiatry

## 2022-11-25 ENCOUNTER — Encounter: Payer: Self-pay | Admitting: Podiatry

## 2022-11-25 DIAGNOSIS — L600 Ingrowing nail: Secondary | ICD-10-CM

## 2022-11-25 DIAGNOSIS — I2510 Atherosclerotic heart disease of native coronary artery without angina pectoris: Secondary | ICD-10-CM | POA: Diagnosis not present

## 2022-11-25 MED ORDER — NEOMYCIN-POLYMYXIN-HC 1 % OT SOLN: OTIC | 1 refills | Status: DC

## 2022-11-25 NOTE — Patient Instructions (Signed)

## 2022-11-25 NOTE — Progress Notes (Signed)
Subjective:  Patient ID: Brad Cruz, male    DOB: 07/20/1946,  MRN: [id number] HPI Chief Complaint  Patient presents with   Toe Pain    Hallux bilateral - medial borders, tender x months, redness, tried clipping, no help   New Patient (Initial Visit)    77 y.o. male presents with the above complaint.   ROS: Denies fever chills nausea vomit muscle aches pains calf pain back pain chest pain shortness of breath.  Past Medical History:  Diagnosis Date   Allergy    Mold, cat dander   Arthritis    cerv spine   Asthma    Childhood history   BPH (benign prostatic hyperplasia)    Cataract    Coronary atherosclerosis of native coronary artery    a. 06/2012 Myoview:  No ischemia;  b. 06/25/2012 Cath:  LM nl, LAD 50p, 67m D1 80 ost (small), D2 nl (small), LCX 30 ost, OM1 nl, RI 30 ost, RCA 50-60- ost, 336mPDA minor irregs, PL nl (small), EF 65%.    GERD (gastroesophageal reflux disease)    Hyperlipidemia    Hypothyroidism    Migraines    Pulmonary granuloma (HCC)    Chest CT 6/13   Skin cancer, basal cell    BCC face   Vasovagal syncope    Vitamin D deficiency disease 08/09/2019   Past Surgical History:  Procedure Laterality Date   BIOPSY  09/09/2017   Procedure: BIOPSY;  Surgeon: ReRogene HoustonMD;  [facility]: [facility];  Service: Endoscopy;;  gastric   CARDIAC CATHETERIZATION     COLONOSCOPY     Deviated septum repair 1989     ESOPHAGOGASTRODUODENOSCOPY N/A 09/09/2017   Procedure: ESOPHAGOGASTRODUODENOSCOPY (EGD);  Surgeon: ReRogene HoustonMD;  [facility]: [facility];  Service: Endoscopy;  Laterality: N/A;   LEFT HEART CATHETERIZATION WITH CORONARY ANGIOGRAM N/A 06/25/2012   Procedure: LEFT HEART CATHETERIZATION WITH CORONARY ANGIOGRAM;  Surgeon: JaMinus BreedingMD;  [facility];  Service: Cardiovascular;  Laterality: N/A;   LUMBAR LAMINECTOMY/DECOMPRESSION MICRODISCECTOMY  11/23/2012   Procedure: LUMBAR LAMINECTOMY/DECOMPRESSION MICRODISCECTOMY 1 LEVEL;   Surgeon: ErFloyce StakesMD;  Location: [facility];  Service: Neurosurgery;  Laterality: Left;  Left Lumbar five-sacral one Diskectomy   SKIN CANCER EXCISION     Under left eye basal cell   SPINE SURGERY      Current Outpatient Medications:    NEOMYCIN-POLYMYXIN-HYDROCORTISONE (CORTISPORIN) 1 % SOLN OTIC solution, Apply 1-2 drops to toe BID after soaking, Disp: 10 mL, Rfl: 1   acetaminophen (TYLENOL) 500 MG tablet, Take 500 mg by mouth every 6 (six) hours as needed. (Patient not taking: Reported on 11/19/2022), Disp: , Rfl:    Cholecalciferol 125 MCG (5000 UT) capsule, Take 10,000 Units by mouth daily. (Patient not taking: Reported on 11/19/2022), Disp: , Rfl:    ezetimibe (ZETIA) 10 MG tablet, Take 1 tablet (10 mg total) by mouth daily., Disp: 90 tablet, Rfl: 2   fexofenadine (ALLEGRA) 180 MG tablet, Take 180 mg by mouth daily., Disp: , Rfl:    levothyroxine (SYNTHROID) 50 MCG tablet, Take 1 tablet (50 mcg total) by mouth daily., Disp: 90 tablet, Rfl: 1   rosuvastatin (CRESTOR) 5 MG tablet, Take 1 tablet (5 mg total) by mouth every other day., Disp: 45 tablet, Rfl: 1   Saw Palmetto, Serenoa repens, 320 MG CAPS, Take 640 mg at bedtime by mouth. , Disp: , Rfl:    vitamin B-12 (CYANOCOBALAMIN) 1000 MCG tablet, Take 1,000 mcg  by mouth daily., Disp: , Rfl:   Allergies  Allergen Reactions   Citrus Hives    Avoid grapefruit, OJ   Ibuprofen Hives, Swelling and Rash   Shellfish-Derived Products Hives    Rash    Statins Other (See Comments)   Review of Systems Objective:  There were no vitals filed for this visit.  General: Well developed, nourished, in no acute distress, alert and oriented x3   Dermatological: Skin is warm, dry and supple bilateral. Nails x 10 are well maintained; remaining integument appears unremarkable at this time. There are no open sores, no preulcerative lesions, no rash or signs of infection present.  Ingrown toenails tibial-fibular border of the hallux bilateral  mild to moderate erythema no purulence no malodor but severe tenderness on palpation  Vascular: Dorsalis Pedis artery and Posterior Tibial artery pedal pulses are 2/4 bilateral with immedate capillary fill time. Pedal hair growth present. No varicosities and no lower extremity edema present bilateral.   Neruologic: Grossly intact via light touch bilateral. Vibratory intact via tuning fork bilateral. Protective threshold with Semmes Wienstein monofilament intact to all pedal sites bilateral. Patellar and Achilles deep tendon reflexes 2+ bilateral. No Babinski or clonus noted bilateral.   Musculoskeletal: No gross boney pedal deformities bilateral. No pain, crepitus, or limitation noted with foot and ankle range of motion bilateral. Muscular strength 5/5 in all groups tested bilateral.  Gait: Unassisted, Nonantalgic.    Radiographs:  None taken  Assessment & Plan:   Assessment: Ingrown toenail tibial fibular border of the hallux bilateral  Plan: Chemical matricectomy was performed today tibial-fibular border of the hallux bilateral.  Tolerated procedure well without complications.  Was given both oral and written home-going structures care and soaking of the toes as well as a prescription for Cortisporin Otic to be applied twice daily after soaking.     [place] Latka T. Carriz[place], [place]

## 2022-12-09 ENCOUNTER — Ambulatory Visit (INDEPENDENT_AMBULATORY_CARE_PROVIDER_SITE_OTHER): Payer: Medicare Other | Admitting: Podiatry

## 2022-12-09 DIAGNOSIS — L600 Ingrowing nail: Secondary | ICD-10-CM

## 2022-12-09 NOTE — Progress Notes (Signed)
  Subjective:  Patient ID: Brad Cruz, male    DOB: [birth year],   MRN: [id number]  Chief Complaint  Patient presents with   Nail Check    2 week f/u nail check overall patient doesn't have any complaints. Little dry blood noted on the bilateral borders.     77 y.o. male presents for follow-up of bilateral great toenail ingrown procedures by Dr. Milinda Pointer. Relates doing well . Denies any other pedal complaints. Denies n/v/f/c.   Past Medical History:  Diagnosis Date   Allergy    Mold, cat dander   Arthritis    cerv spine   Asthma    Childhood history   BPH (benign prostatic hyperplasia)    Cataract    Coronary atherosclerosis of native coronary artery    a. 06/2012 Myoview:  No ischemia;  b. 06/25/2012 Cath:  LM nl, LAD 50p, 17m D1 80 ost (small), D2 nl (small), LCX 30 ost, OM1 nl, RI 30 ost, RCA 50-60- ost, 356mPDA minor irregs, PL nl (small), EF 65%.    GERD (gastroesophageal reflux disease)    Hyperlipidemia    Hypothyroidism    Migraines    Pulmonary granuloma (HCC)    Chest CT 6/13   Skin cancer, basal cell    BCC face   Vasovagal syncope    Vitamin D deficiency disease 08/09/2019    Objective:  Physical Exam: Vascular: DP/PT pulses 2/4 bilateral. CFT <3 seconds. Normal hair growth on digits. No edema.  Skin. No lacerations or abrasions bilateral feet. Bilateral hallux nails healing well.  Musculoskeletal: MMT 5/5 bilateral lower extremities in DF, PF, Inversion and Eversion. Deceased ROM in DF of ankle joint.  Neurological: Sensation intact to light touch.   Assessment:   1. Ingrown left big toenail   2. Ingrown right big toenail      Plan:  Patient was evaluated and treated and all questions answered. Toe was evaluated and appears to be healing well.  May discontinue soaks and neosporin.  Patient to follow-up as needed.    ReLorenda PeckDPM

## 2023-02-28 DIAGNOSIS — Z20822 Contact with and (suspected) exposure to covid-19: Secondary | ICD-10-CM | POA: Diagnosis not present

## 2023-02-28 DIAGNOSIS — R059 Cough, unspecified: Secondary | ICD-10-CM | POA: Diagnosis not present

## 2023-02-28 DIAGNOSIS — R0982 Postnasal drip: Secondary | ICD-10-CM | POA: Diagnosis not present

## 2023-03-03 ENCOUNTER — Ambulatory Visit: Payer: Medicare Other | Admitting: Internal Medicine

## 2023-03-04 ENCOUNTER — Ambulatory Visit (INDEPENDENT_AMBULATORY_CARE_PROVIDER_SITE_OTHER): Payer: Medicare Other | Admitting: Internal Medicine

## 2023-03-04 ENCOUNTER — Encounter: Payer: Self-pay | Admitting: Internal Medicine

## 2023-03-04 VITALS — BP 126/82 | HR 80 | Ht 69.0 in | Wt 183.8 lb

## 2023-03-04 DIAGNOSIS — E039 Hypothyroidism, unspecified: Secondary | ICD-10-CM | POA: Diagnosis not present

## 2023-03-04 DIAGNOSIS — I2510 Atherosclerotic heart disease of native coronary artery without angina pectoris: Secondary | ICD-10-CM

## 2023-03-04 DIAGNOSIS — J0110 Acute frontal sinusitis, unspecified: Secondary | ICD-10-CM

## 2023-03-04 DIAGNOSIS — F4321 Adjustment disorder with depressed mood: Secondary | ICD-10-CM | POA: Diagnosis not present

## 2023-03-04 DIAGNOSIS — D71 Functional disorders of polymorphonuclear neutrophils: Secondary | ICD-10-CM

## 2023-03-04 DIAGNOSIS — E782 Mixed hyperlipidemia: Secondary | ICD-10-CM

## 2023-03-04 MED ORDER — PRAVASTATIN SODIUM 10 MG PO TABS: 10.0000 mg | ORAL_TABLET | Freq: Every day | ORAL | 1 refills | Status: DC

## 2023-03-04 MED ORDER — AZITHROMYCIN 250 MG PO TABS: ORAL_TABLET | ORAL | 0 refills | Status: AC

## 2023-03-04 NOTE — Progress Notes (Addendum)
Established Patient Office Visit  Subjective:  Patient ID: Brad Cruz, male    DOB: 06-07-1946  Age: 77 y.o. MRN: [id number]  CC:  Chief Complaint  Patient presents with   Cough    Pt reports sx of chest cold and ongoing cough, reports going to urgent care over the weekend 02/28/23, had an xray done. Sx have not improved.    Hyperlipidemia    Pt would like to discuss medication change on crestor, would like to try pravastatin.     HPI Brad Cruz is a 77 y.o. male with past medical history of nonobstructive CAD, hypothyroidism and HLD who presents for f/u of his chronic medical conditions.  He c/o cough and chest tightness for 1 week. He went to Urgent care, and had negative flu and COVID test. He had CXR on 02/28/23, which showed small left pleural effusion. He now has nasal congestion, postnasal drip and sinus pressure related headache. Denies fever, chills, dyspnea or wheezing. Of note, CXR also showed old granuloma, denies known history of TB or granulomatous disease.  He takes Zetia for HLD and history of CAD. He is not tolerating Crestor QOD, but asks if he can try Pravastatin.  He denies any chest pain, dyspnea or palpitations.  He follows up with Dr. Diona Browner for history of CAD.  Hypothyroidism: He has been tolerating levothyroxine well.  He denies any recent change in weight or appetite.  He has chronic fatigue, which has improved since starting levothyroxine.  He also takes testosterone supplement from [facility].  His wife was diagnosed with metastatic lung cancer, and he had been feeling stressed with it.  He reports mild anhedonia, but is manageable and denied referral to [facility] therapy for now.  Denies any SI or HI currently.  Past Medical History:  Diagnosis Date   Allergy    Mold, cat dander   Arthritis    cerv spine   Asthma    Childhood history   BPH (benign prostatic hyperplasia)    Cataract    Coronary atherosclerosis of native coronary artery    a.  06/2012 Myoview:  No ischemia;  b. 06/25/2012 Cath:  LM nl, LAD 50p, 14m, D1 80 ost (small), D2 nl (small), LCX 30 ost, OM1 nl, RI 30 ost, RCA 50-60- ost, 76m, PDA minor irregs, PL nl (small), EF 65%.    GERD (gastroesophageal reflux disease)    Hyperlipidemia    Hypothyroidism    Migraines    Pulmonary granuloma (HCC)    Chest CT 6/13   Skin cancer, basal cell    BCC face   Vasovagal syncope    Vitamin D deficiency disease 08/09/2019    Past Surgical History:  Procedure Laterality Date   BIOPSY  09/09/2017   Procedure: BIOPSY;  Surgeon: Malissa Hippo, MD;  Location: [facility];  Service: Endoscopy;;  gastric   CARDIAC CATHETERIZATION     COLONOSCOPY     Deviated septum repair 1989     ESOPHAGOGASTRODUODENOSCOPY N/A 09/09/2017   Procedure: ESOPHAGOGASTRODUODENOSCOPY (EGD);  Surgeon: Malissa Hippo, MD;  Location: [facility];  Service: Endoscopy;  Laterality: N/A;   LEFT HEART CATHETERIZATION WITH CORONARY ANGIOGRAM N/A 06/25/2012   Procedure: LEFT HEART CATHETERIZATION WITH CORONARY ANGIOGRAM;  Surgeon: [name], MD;  Location: [facility] CATH LAB;  Service: Cardiovascular;  Laterality: N/A;   LUMBAR LAMINECTOMY/DECOMPRESSION MICRODISCECTOMY  11/23/2012   Procedure: LUMBAR LAMINECTOMY/DECOMPRESSION MICRODISCECTOMY 1 LEVEL;  Surgeon: Karn Cassis, MD;  Location: [facility];  Service: Neurosurgery;  Laterality: Left;  Left Lumbar five-sacral one Diskectomy   SKIN CANCER EXCISION     Under left eye basal cell   SPINE SURGERY      Family History  Problem Relation Age of Onset   Heart disease Mother        age 20   Transient ischemic attack Mother    Stroke Mother    Stomach cancer Sister    Cancer Sister        age 47   Colon polyps Brother    Heart disease Brother    Thyroid cancer Brother    Alzheimer's disease Father        53   Colon cancer Neg Hx     Social History   Socioeconomic History   Marital status: Married    Spouse name: bettie   Number of  children: 1   Years of education: 52   Highest education level: Professional school degree (e.g., MD, DDS, DVM, JD)  Occupational History   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  Tobacco Use   Smoking status: Never   Smokeless tobacco: Never  Vaping Use   Vaping status: Never Used  Substance and Sexual Activity   Alcohol use: No    Alcohol/week: 0.0 standard drinks of alcohol   Drug use: No   Sexual activity: Yes    Partners: Female    Birth control/protection: Post-menopausal  Other Topics Concern   Not on file  Social History Narrative   Lives with [place]   Married 35 years   Involved with church and the [employer]    Social Determinants of Health   Financial Resource Strain: Low Risk  (03/09/2023)   Overall Financial Resource Strain (CARDIA)    Difficulty of Paying Living Expenses: Not hard at all  Food Insecurity: No Food Insecurity (03/09/2023)   Hunger Vital Sign    Worried About Running Out of Food in the Last Year: Never true    Ran Out of Food in the Last Year: Never true  Transportation Needs: No Transportation Needs (03/09/2023)   PRAPARE - Administrator, Civil Service (Medical): No    Lack of Transportation (Non-Medical): No  Physical Activity: Insufficiently Active (03/09/2023)   Exercise Vital Sign    Days of Exercise per Week: 5 days    Minutes of Exercise per Session: 20 min  Stress: No Stress Concern Present (03/09/2023)   [employer] of Occupational Health - Occupational Stress Questionnaire    Feeling of Stress : Not at all  Social Connections: Moderately Integrated (03/09/2023)   Social Connection and Isolation Panel [NHANES]    Frequency of Communication with Friends and Family: Once a week    Frequency of Social Gatherings with Friends and Family: More than three times a week    Attends Religious Services: More than 4 times per year    Active Member of [employer] or Organizations: No    Attends  Banker Meetings: Never    Marital Status: Married  Catering manager Violence: Not At Risk (03/09/2023)   Humiliation, Afraid, Rape, and Kick questionnaire    Fear of Current or Ex-Partner: No    Emotionally Abused: No    Physically Abused: No    Sexually Abused: No    Outpatient Medications Prior to Visit  Medication Sig Dispense Refill   acetaminophen (TYLENOL) 500 MG tablet Take 500 mg by mouth every  6 (six) hours as needed.     Cholecalciferol 125 MCG (5000 UT) capsule Take 10,000 Units by mouth daily.     fexofenadine (ALLEGRA) 180 MG tablet Take 180 mg by mouth as needed for allergies or rhinitis.     Saw Palmetto, Serenoa repens, 320 MG CAPS Take 640 mg at bedtime by mouth.      vitamin B-12 (CYANOCOBALAMIN) 1000 MCG tablet Take 1,000 mcg by mouth daily.     ezetimibe (ZETIA) 10 MG tablet Take 1 tablet (10 mg total) by mouth daily. (Patient not taking: Reported on 06/10/2023) 90 tablet 2   levothyroxine (SYNTHROID) 50 MCG tablet Take 1 tablet (50 mcg total) by mouth daily. 90 tablet 1   NEOMYCIN-POLYMYXIN-HYDROCORTISONE (CORTISPORIN) 1 % SOLN OTIC solution Apply 1-2 drops to toe BID after soaking (Patient not taking: Reported on 06/10/2023) 10 mL 1   rosuvastatin (CRESTOR) 5 MG tablet Take 1 tablet (5 mg total) by mouth every other day. 45 tablet 1   No facility-administered medications prior to visit.    Allergies  Allergen Reactions   Citrus Hives    Avoid grapefruit, OJ   Ibuprofen Hives, Swelling and Rash   Shellfish-Derived Products Hives    Rash    Statins Other (See Comments)    ROS Review of Systems  Constitutional:  Positive for fatigue. Negative for chills and fever.  HENT:  Positive for congestion, postnasal drip and sinus pressure. Negative for sore throat.   Eyes:  Negative for pain and discharge.  Respiratory:  Positive for cough. Negative for shortness of breath.   Cardiovascular:  Negative for chest pain and palpitations.  Gastrointestinal:   Negative for diarrhea, nausea and vomiting.  Endocrine: Negative for polydipsia and polyuria.  Genitourinary:  Negative for dysuria and hematuria.  Musculoskeletal:  Negative for neck pain and neck stiffness.  Skin:  Negative for rash.  Neurological:  Positive for headaches. Negative for dizziness, weakness and numbness.  Psychiatric/Behavioral:  Positive for dysphoric mood. Negative for agitation and behavioral problems.       Objective:    Physical Exam Vitals reviewed.  Constitutional:      General: He is not in acute distress.    Appearance: He is not diaphoretic.  HENT:     Head: Normocephalic and atraumatic.     Nose: Nose normal.     Mouth/Throat:     Mouth: Mucous membranes are moist.  Eyes:     General: No scleral icterus.    Extraocular Movements: Extraocular movements intact.  Cardiovascular:     Rate and Rhythm: Normal rate and regular rhythm.     Pulses: Normal pulses.     Heart sounds: Normal heart sounds. No murmur heard. Pulmonary:     Breath sounds: Normal breath sounds. No wheezing or rales.  Musculoskeletal:     Cervical back: Neck supple. No tenderness.     Right lower leg: No edema.     Left lower leg: No edema.  Feet:     Left foot:     Toenail Condition: Left toenails are ingrown.  Skin:    General: Skin is warm.     Findings: No rash.  Neurological:     General: No focal deficit present.     Mental Status: He is alert and oriented to person, place, and time.     Sensory: No sensory deficit.     Motor: No weakness.  Psychiatric:        Mood and Affect: Mood normal.  Behavior: Behavior normal.     BP 126/82 (BP Location: Left Arm)   Pulse 80   Ht 5\' 9"  (1.753 m)   Wt 183 lb 12.8 oz (83.4 kg)   SpO2 95%   BMI 27.14 kg/m  Wt Readings from Last 3 Encounters:  09/01/23 184 lb 9.6 oz (83.7 kg)  06/10/23 185 lb (83.9 kg)  03/04/23 183 lb 12.8 oz (83.4 kg)    Lab Results  Component Value Date   TSH 5.530 (H) 08/26/2023   Lab  Results  Component Value Date   WBC 6.2 11/21/2021   HGB 16.0 11/21/2021   HCT 48.2 11/21/2021   MCV 90 11/21/2021   PLT 211 11/21/2021   Lab Results  Component Value Date   NA 138 08/26/2023   K 4.3 08/26/2023   CO2 23 08/26/2023   GLUCOSE 95 08/26/2023   BUN 24 08/26/2023   CREATININE 1.08 08/26/2023   BILITOT 0.9 08/26/2023   ALKPHOS 54 08/26/2023   AST 22 08/26/2023   ALT 22 08/26/2023   PROT 6.9 08/26/2023   ALBUMIN 4.6 08/26/2023   CALCIUM 9.2 08/26/2023   ANIONGAP 7 09/09/2017   EGFR 71 08/26/2023   Lab Results  Component Value Date   CHOL 155 08/26/2023   Lab Results  Component Value Date   HDL 38 (L) 08/26/2023   Lab Results  Component Value Date   LDLCALC 92 08/26/2023   Lab Results  Component Value Date   TRIG 140 08/26/2023   Lab Results  Component Value Date   CHOLHDL 4.1 08/26/2023   Lab Results  Component Value Date   HGBA1C 5.8 (H) 11/13/2022      Assessment & Plan:   Problem List Items Addressed This Visit       Cardiovascular and Mediastinum   CAD (coronary artery disease), native coronary artery    Asymptomatic currently Needs to restart statin Followed by cardiology        Respiratory   Acute non-recurrent frontal sinusitis - Primary    Started empiric azithromycin as he has persistent symptoms despite symptomatic treatment Continue Allegra for allergies Nasal saline spray for as needed for nasal congestion Mucinex or Robitussin as needed for cough        Endocrine   Hypothyroidism    Lab Results  Component Value Date   TSH 6.480 (H) 11/13/2022   On levothyroxine 50 mcg QD He had persistent fatigue, increased dose of levothyroxine to 50 mcg QD in the last visit Check TSH and free T4 Fatigue had improved with thyroid supplement        Other   Hyperlipidemia    On Crestor 5 mg twice in a week, did not tolerate daily dose in the past Switched to Pravastatin Check lipid profile      Adjustment disorder with  depressed mood    Likely due to acute stressor in the setting of his wife's recent diagnosis of metastatic lung cancer Denied [facility] therapy referral in the past - improving now      Granulomatous disease (HCC)    Recent CXR showed old granulomatous disease and small pleural effusion versus scarring CT chest (2013) showed calcified granuloma, which is still stable No previous history of TB, but was in active [employer], in [place] mostly His current current symptoms are likely due to acute sinusitis and unlikely to be related to old granuloma.       Meds ordered this encounter  Medications   DISCONTD: pravastatin (PRAVACHOL) 10 MG tablet  Sig: Take 1 tablet (10 mg total) by mouth daily.    Dispense:  90 tablet    Refill:  1    Discontinue Crestor.   azithromycin (ZITHROMAX) 250 MG tablet    Sig: Take 2 tablets on day 1, then 1 tablet daily on days 2 through 5    Dispense:  6 tablet    Refill:  0    Follow-up: Return in about 6 months (around 09/04/2023) for CAD and HLD.    Anabel Halon, MD

## 2023-03-04 NOTE — Assessment & Plan Note (Signed)
Lab Results  Component Value Date   TSH 6.480 (H) 11/13/2022   On levothyroxine 50 mcg QD He had persistent fatigue, increased dose of levothyroxine to 50 mcg QD in the last visit Check TSH and free T4 Fatigue had improved with thyroid supplement

## 2023-03-04 NOTE — Assessment & Plan Note (Signed)
On Crestor 5 mg twice in a week, did not tolerate daily dose in the past Switched to Pravastatin Check lipid profile

## 2023-03-04 NOTE — Assessment & Plan Note (Addendum)
Recent CXR showed old granulomatous disease and small pleural effusion versus scarring CT chest (2013) showed calcified granuloma, which is still stable No previous history of TB, but was in active [employer], in [place] mostly His current current symptoms are likely due to acute sinusitis and unlikely to be related to old granuloma.

## 2023-03-04 NOTE — Patient Instructions (Signed)
Please start taking Azithromycin as prescribed.  Please start taking Pravastatin instead of Crestor.  Please continue to follow low salt diet and perform moderate exercise/walking as tolerated.

## 2023-03-04 NOTE — Assessment & Plan Note (Signed)
Started empiric azithromycin as he has persistent symptoms despite symptomatic treatment Continue Allegra for allergies Nasal saline spray for as needed for nasal congestion Mucinex or Robitussin as needed for cough

## 2023-03-04 NOTE — Assessment & Plan Note (Addendum)
Likely due to acute stressor in the setting of his wife's recent diagnosis of metastatic lung cancer Denied [facility] therapy referral in the past - improving now

## 2023-03-04 NOTE — Assessment & Plan Note (Signed)
Asymptomatic currently Needs to restart statin Followed by cardiology 

## 2023-03-09 ENCOUNTER — Ambulatory Visit (INDEPENDENT_AMBULATORY_CARE_PROVIDER_SITE_OTHER): Payer: Medicare Other

## 2023-03-09 DIAGNOSIS — Z0000 Encounter for general adult medical examination without abnormal findings: Secondary | ICD-10-CM | POA: Diagnosis not present

## 2023-03-09 NOTE — Progress Notes (Signed)
Subjective:   Brad Cruz is a 77 y.o. male who presents for Medicare Annual/Subsequent preventive examination.  Review of Systems    I connected with  Brad Cruz on 03/09/23 by a audio enabled telemedicine application and verified that I am speaking with the correct person using two identifiers.  Patient Location: Home  Provider Location: Office/Clinic  I discussed the limitations of evaluation and management by telemedicine. The patient expressed understanding and agreed to proceed.        Objective:    There were no vitals filed for this visit. There is no height or weight on file to calculate BMI.     03/03/2022    9:24 AM 02/28/2021    9:37 AM 02/16/2020    2:07 PM 09/09/2017    4:13 PM 09/09/2017   12:02 PM 09/09/2017    9:29 AM 03/21/2014    9:07 AM  Advanced Directives  Does Patient Have a Medical Advance Directive? Yes Yes Yes Yes No Yes Patient has advance directive, copy not in chart  Type of Advance Directive Living will Healthcare Power of [place];Living will  Healthcare Power of [place];Living will  Healthcare Power of [place];Living will   Does patient want to make changes to medical advance directive? Yes (Inpatient - patient defers changing a medical advance directive at this time - Information given) No - Patient declined Yes (MAU/Ambulatory/Procedural Areas - Information given) No - Patient declined     Copy of Healthcare Power of Attorney in Chart?   Yes - validated most recent copy scanned in chart (See row information) No - copy requested  No - copy requested     Current Medications (verified) Outpatient Encounter Medications as of 03/09/2023  Medication Sig   acetaminophen (TYLENOL) 500 MG tablet Take 500 mg by mouth every 6 (six) hours as needed.   azithromycin (ZITHROMAX) 250 MG tablet Take 2 tablets on day 1, then 1 tablet daily on days 2 through 5   Cholecalciferol 125 MCG (5000 UT) capsule Take 10,000 Units by mouth daily.   ezetimibe (ZETIA)  10 MG tablet Take 1 tablet (10 mg total) by mouth daily.   fexofenadine (ALLEGRA) 180 MG tablet Take 180 mg by mouth daily.   levothyroxine (SYNTHROID) 50 MCG tablet Take 1 tablet (50 mcg total) by mouth daily.   NEOMYCIN-POLYMYXIN-HYDROCORTISONE (CORTISPORIN) 1 % SOLN OTIC solution Apply 1-2 drops to toe BID after soaking   pravastatin (PRAVACHOL) 10 MG tablet Take 1 tablet (10 mg total) by mouth daily.   Saw Palmetto, Serenoa repens, 320 MG CAPS Take 640 mg at bedtime by mouth.    vitamin B-12 (CYANOCOBALAMIN) 1000 MCG tablet Take 1,000 mcg by mouth daily.   No facility-administered encounter medications on file as of 03/09/2023.    Allergies (verified) Citrus, Ibuprofen, Shellfish-derived products, and Statins   History: Past Medical History:  Diagnosis Date   Allergy    Mold, cat dander   Arthritis    cerv spine   Asthma    Childhood history   BPH (benign prostatic hyperplasia)    Cataract    Coronary atherosclerosis of native coronary artery    a. 06/2012 Myoview:  No ischemia;  b. 06/25/2012 Cath:  LM nl, LAD 50p, 24m, D1 80 ost (small), D2 nl (small), LCX 30 ost, OM1 nl, RI 30 ost, RCA 50-60- ost, 40m, PDA minor irregs, PL nl (small), EF 65%.    GERD (gastroesophageal reflux disease)    Hyperlipidemia    Hypothyroidism  Migraines    Pulmonary granuloma (HCC)    Chest CT 6/13   Skin cancer, basal cell    BCC face   Vasovagal syncope    Vitamin D deficiency disease 08/09/2019   Past Surgical History:  Procedure Laterality Date   BIOPSY  09/09/2017   Procedure: BIOPSY;  Surgeon: Malissa Hippo, MD;  Location: [facility];  Service: Endoscopy;;  gastric   CARDIAC CATHETERIZATION     COLONOSCOPY     Deviated septum repair 1989     ESOPHAGOGASTRODUODENOSCOPY N/A 09/09/2017   Procedure: ESOPHAGOGASTRODUODENOSCOPY (EGD);  Surgeon: Malissa Hippo, MD;  Location: [facility];  Service: Endoscopy;  Laterality: N/A;   LEFT HEART CATHETERIZATION WITH CORONARY ANGIOGRAM N/A  06/25/2012   Procedure: LEFT HEART CATHETERIZATION WITH CORONARY ANGIOGRAM;  Surgeon: [name], MD;  Location: [facility] CATH LAB;  Service: Cardiovascular;  Laterality: N/A;   LUMBAR LAMINECTOMY/DECOMPRESSION MICRODISCECTOMY  11/23/2012   Procedure: LUMBAR LAMINECTOMY/DECOMPRESSION MICRODISCECTOMY 1 LEVEL;  Surgeon: Karn Cassis, MD;  Location: [facility];  Service: Neurosurgery;  Laterality: Left;  Left Lumbar five-sacral one Diskectomy   SKIN CANCER EXCISION     Under left eye basal cell   SPINE SURGERY     Family History  Problem Relation Age of Onset   Heart disease Mother        age 91   Transient ischemic attack Mother    Stroke Mother    Stomach cancer Sister    Cancer Sister        age 50   Colon polyps Brother    Heart disease Brother    Thyroid cancer Brother    Alzheimer's disease Father        13   Colon cancer Neg Hx    Social History   Socioeconomic History   Marital status: Married    Spouse name: bettie   Number of children: 1   Years of education: 68   Highest education level: Professional school degree (e.g., MD, DDS, DVM, JD)  Occupational History   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  Tobacco Use   Smoking status: Never   Smokeless tobacco: Never  Building services engineer Use: Never used  Substance and Sexual Activity   Alcohol use: No    Alcohol/week: 0.0 standard drinks of alcohol   Drug use: No   Sexual activity: Yes    Partners: Female    Birth control/protection: Post-menopausal  Other Topics Concern   Not on file  Social History Narrative   Lives with [place]   Married 35 years   Involved with church and the [employer]    Social Determinants of Health   Financial Resource Strain: Low Risk  (03/03/2022)   Overall Financial Resource Strain (CARDIA)    Difficulty of Paying Living Expenses: Not hard at all  Food Insecurity: No Food Insecurity (03/03/2022)   Hunger Vital Sign    Worried  About Running Out of Food in the Last Year: Never true    Ran Out of Food in the Last Year: Never true  Transportation Needs: No Transportation Needs (03/03/2022)   PRAPARE - Administrator, Civil Service (Medical): No    Lack of Transportation (Non-Medical): No  Physical Activity: Inactive (03/03/2022)   Exercise Vital Sign    Days of Exercise per Week: 0 days    Minutes of Exercise per Session: 0 min  Stress: No  Stress Concern Present (03/03/2022)   [employer] of Occupational Health - Occupational Stress Questionnaire    Feeling of Stress : Not at all  Social Connections: Socially Isolated (03/03/2022)   Social Connection and Isolation Panel [NHANES]    Frequency of Communication with Friends and Family: Once a week    Frequency of Social Gatherings with Friends and Family: Never    Attends Religious Services: Never    Database administrator or Organizations: No    Attends Banker Meetings: Never    Marital Status: Married    Tobacco Counseling Counseling given: Not Answered   Clinical Intake:   How often do you need to have someone help you when you read instructions, pamphlets, or other written materials from your doctor or pharmacy?: (P) 1 - Never  Diabetic?NO     Activities of Daily Living    03/08/2023    2:43 PM  In your present state of health, do you have any difficulty performing the following activities:  Hearing? 0  Vision? 0  Difficulty concentrating or making decisions? 0  Walking or climbing stairs? 0  Dressing or bathing? 0  Doing errands, shopping? 0  Preparing Food and eating ? N  Using the Toilet? N  In the past six months, have you accidently leaked urine? N  Do you have problems with loss of bowel control? N  Managing your Medications? N  Managing your Finances? N  Housekeeping or managing your Housekeeping? N    Patient Care Team: Anabel Halon, MD as PCP - General (Internal Medicine) Jonelle Sidle, MD as PCP  - Cardiology (Cardiology)  Indicate any recent Medical Services you may have received from other than [facility] providers in the past year (date may be approximate).     Assessment:   This is a routine wellness examination for Michaeel.  Hearing/Vision screen No results found.  Dietary issues and exercise activities discussed:     Goals Addressed   None   Depression Screen    03/04/2023    9:11 AM 11/19/2022   10:55 AM 05/20/2022    9:13 AM 03/03/2022    9:24 AM 03/03/2022    9:22 AM 02/20/2022   10:35 AM 11/20/2021    9:29 AM  PHQ 2/9 Scores  PHQ - 2 Score 0 0 0 0 0 0 0  PHQ- 9 Score 0          Fall Risk    03/08/2023    2:43 PM 03/04/2023    9:11 AM 11/19/2022   10:55 AM 05/20/2022    9:13 AM 03/03/2022    9:24 AM  Fall Risk   Falls in the past year? 0 0 0 0 0  Number falls in past yr: 0 0 0 0 0  Injury with Fall? 0 0 0 0 0  Risk for fall due to :  No Fall Risks  No Fall Risks No Fall Risks  Follow up  Falls evaluation completed  Falls evaluation completed Falls evaluation completed    FALL RISK PREVENTION PERTAINING TO THE HOME:  Any stairs in or around the home? Yes  If so, are there any without handrails? No  Home free of loose throw rugs in walkways, pet beds, electrical cords, etc? Yes  Adequate lighting in your home to reduce risk of falls? Yes   ASSISTIVE DEVICES UTILIZED TO PREVENT FALLS:  Life alert? No  Use of a cane, walker or w/c? No  Grab bars in the  bathroom? Yes  Shower chair or bench in shower? Yes  Elevated toilet seat or a handicapped toilet? No       03/03/2022    9:25 AM  MMSE - Mini Mental State Exam  Not completed: Unable to complete        03/03/2022    9:25 AM 02/28/2021    9:40 AM 02/16/2020    2:10 PM  6CIT Screen  What Year? 0 points 0 points 0 points  What month? 0 points 0 points 0 points  What time? 0 points 0 points 0 points  Count back from 20 0 points 0 points 0 points  Months in reverse 0 points 0 points 0 points  Repeat phrase 0  points 2 points 0 points  Total Score 0 points 2 points 0 points    Immunizations Immunization History  Administered Date(s) Administered   Fluad Quad(high Dose 65+) 09/08/2019, 08/29/2020, 10/02/2021, 09/08/2022   Influenza Split 11/24/2012, 08/12/2017   Influenza,inj,Quad PF,6+ Mos 08/27/2016   Moderna Sars-Covid-2 Vaccination 12/09/2019, 01/07/2020, 09/03/2020   Pneumococcal Conjugate-13 11/19/2017   Pneumococcal Polysaccharide-23 11/24/2012, 02/28/2021   RSV,unspecified 10/31/2022   Tdap 02/16/2020   Zoster Recombinat (Shingrix) 08/01/2019, 08/31/2019    TDAP status: Up to date  Flu Vaccine status: Up to date  Pneumococcal vaccine status: Up to date  Covid-19 vaccine status: Completed vaccines  Qualifies for Shingles Vaccine? Yes   Zostavax completed Yes   Shingrix Completed?: Yes  Screening Tests Health Maintenance  Topic Date Due   COVID-19 Vaccine (4 - 2023-24 season) 07/04/2022   INFLUENZA VACCINE  06/04/2023   Medicare Annual Wellness (AWV)  03/08/2024   DTaP/Tdap/Td (2 - Td or Tdap) 02/15/2030   Pneumonia Vaccine 23+ Years old  Completed   Hepatitis C Screening  Completed   Zoster Vaccines- Shingrix  Completed   HPV VACCINES  Aged Out   COLONOSCOPY (Pts 45-31yrs Insurance coverage will need to be confirmed)  Discontinued    Health Maintenance  Health Maintenance Due  Topic Date Due   COVID-19 Vaccine (4 - 2023-24 season) 07/04/2022    Colorectal cancer screening: No longer required.   Lung Cancer Screening: (Low Dose CT Chest recommended if Age 50-80 years, 30 pack-year currently smoking OR have quit w/in 15years.) does not qualify.   Lung Cancer Screening Referral: NO  Additional Screening:  Hepatitis C Screening: does qualify; Completed 11/19/17  Vision Screening: Recommended annual ophthalmology exams for early detection of glaucoma and other disorders of the eye. Is the patient up to date with their annual eye exam?  Yes  Who is the  provider or what is the name of the office in which the patient attends annual eye exams? N/a If pt is not established with a provider, would they like to be referred to a provider to establish care? No .   Dental Screening: Recommended annual dental exams for proper oral hygiene  Community Resource Referral / Chronic Care Management: CRR required this visit?  No   CCM required this visit?  No      Plan:     I have personally reviewed and noted the following in the patient's chart:   Medical and social history Use of alcohol, tobacco or illicit drugs  Current medications and supplements including opioid prescriptions. Patient is not currently taking opioid prescriptions. Functional ability and status Nutritional status Physical activity Advanced directives List of other physicians Hospitalizations, surgeries, and ER visits in previous 12 months Vitals Screenings to include cognitive, depression, and falls  Referrals and appointments  In addition, I have reviewed and discussed with patient certain preventive protocols, quality metrics, and best practice recommendations. A written personalized care plan for preventive services as well as general preventive health recommendations were provided to patient.     Jasper Riling, CMA   03/09/2023

## 2023-03-09 NOTE — Patient Instructions (Signed)
  [name] , Thank you for taking time to come for your Medicare Wellness Visit. I appreciate your ongoing commitment to your health goals. Please review the following plan we discussed and let me know if I can assist you in the future.   These are the goals we discussed:  Goals      Patient Stated     Take care of my wife.     Patient Stated     Take care of wife.        This is a list of the screening recommended for you and due dates:  Health Maintenance  Topic Date Due   COVID-19 Vaccine (4 - 2023-24 season) 07/04/2022   Flu Shot  06/04/2023   Medicare Annual Wellness Visit  03/08/2024   DTaP/Tdap/Td vaccine (2 - Td or Tdap) 02/15/2030   Pneumonia Vaccine  Completed   Hepatitis C Screening: USPSTF Recommendation to screen - Ages 43-79 yo.  Completed   Zoster (Shingles) Vaccine  Completed   HPV Vaccine  Aged Out   Colon Cancer Screening  Discontinued

## 2023-03-25 ENCOUNTER — Ambulatory Visit: Payer: Medicare Other | Admitting: Internal Medicine

## 2023-04-08 ENCOUNTER — Ambulatory Visit: Payer: Medicare Other | Admitting: Internal Medicine

## 2023-05-14 DIAGNOSIS — Z0000 Encounter for general adult medical examination without abnormal findings: Secondary | ICD-10-CM | POA: Diagnosis not present

## 2023-05-18 DIAGNOSIS — H35433 Paving stone degeneration of retina, bilateral: Secondary | ICD-10-CM | POA: Diagnosis not present

## 2023-05-23 ENCOUNTER — Other Ambulatory Visit: Payer: Self-pay | Admitting: Internal Medicine

## 2023-05-23 DIAGNOSIS — E039 Hypothyroidism, unspecified: Secondary | ICD-10-CM

## 2023-06-10 ENCOUNTER — Encounter: Payer: Self-pay | Admitting: Cardiology

## 2023-06-10 ENCOUNTER — Ambulatory Visit: Payer: Medicare Other | Attending: Cardiology | Admitting: Cardiology

## 2023-06-10 VITALS — BP 124/60 | HR 70 | Ht 69.0 in | Wt 185.0 lb

## 2023-06-10 DIAGNOSIS — E782 Mixed hyperlipidemia: Secondary | ICD-10-CM | POA: Diagnosis not present

## 2023-06-10 DIAGNOSIS — I2510 Atherosclerotic heart disease of native coronary artery without angina pectoris: Secondary | ICD-10-CM | POA: Insufficient documentation

## 2023-06-10 NOTE — Patient Instructions (Signed)
Medication Instructions:   Your physician recommends that you continue on your current medications as directed. Please refer to the Current Medication list given to you today.   Labwork: Fasting lipids  Testing/Procedures: None today  Follow-Up: 1 year with Dr.McDowell  Any Other Special Instructions Will Be Listed Below (If Applicable).  If you need a refill on your cardiac medications before your next appointment, please call your pharmacy.

## 2023-06-10 NOTE — Progress Notes (Signed)
    Cardiology Office Note  Date: 06/10/2023   ID: Brad Cruz, DOB 05/02/1946, MRN [id number]  History of Present Illness: Brad Cruz is a 77 y.o. male last seen in August 2023 by Ms. Strader PA-C.  He is here for a routine visit.  He does not report any exertional chest pain or unusual shortness of breath with typical activities.  Does have intermittent reflux symptoms.  He is primary caregiver for his wife who is dealing with lung cancer.  ECG today shows sinus rhythm with right bundle branch block, new in comparison to the prior tracing.  We discussed this today.  No obvious associated symptoms and likely a reflection of conduction system changes with age.  I reviewed his medications.  He is on Pravachol 10 mg daily having not tolerated Crestor previously.  We discussed getting a follow-up FLP and also other treatment options.  Physical Exam: VS:  BP 124/60   Pulse 70   Ht 5\' 9"  (1.753 m)   Wt 185 lb (83.9 kg)   SpO2 98%   BMI 27.32 kg/m , BMI Body mass index is 27.32 kg/m.  Wt Readings from Last 3 Encounters:  06/10/23 185 lb (83.9 kg)  03/04/23 183 lb 12.8 oz (83.4 kg)  11/19/22 184 lb 3.2 oz (83.6 kg)    General: Patient appears comfortable at rest. HEENT: Conjunctiva and lids normal. Neck: Supple, no elevated JVP or carotid bruits. Lungs: Clear to auscultation, nonlabored breathing at rest. Cardiac: Regular rate and rhythm, no S3 or significant systolic murmur. Extremities: No pitting edema.  ECG:  An ECG dated 06/03/2022 was personally reviewed today and demonstrated:  Sinus rhythm.  Labwork: 11/13/2022: ALT 22; AST 20; BUN 20; Creatinine, Ser 1.02; Potassium 4.6; Sodium 140; TSH 6.480     Component Value Date/Time   CHOL 207 (H) 11/13/2022 0908   TRIG 160 (H) 11/13/2022 0908   HDL 37 (L) 11/13/2022 0908   CHOLHDL 5.6 (H) 11/13/2022 0908   CHOLHDL 3.7 02/28/2021 0958   LDLCALC 141 (H) 11/13/2022 0908   LDLCALC 99 02/28/2021 0958   Other Studies Reviewed  Today:  No interval cardiac testing for review today.  Assessment and Plan:  1.  CAD, nonobstructive by cardiac catheterization in 2013.  He has been managed medically.  No angina at this point.  He is maintaining statin therapy.  ECG reviewed with right bundle branch block which is new, although most likely a reflection of asymptomatic conduction system changes.  Continue observation for now.  2.  Mixed hyperlipidemia.  He has had some degree of statin intolerance, currently on Pravachol having switched from Crestor.  LDL up to 141 in January.  Recheck FLP.  Disposition:  Follow up  1 year.  Signed, Jonelle Sidle, M.D., F.A.C.C. [facility]

## 2023-06-16 DIAGNOSIS — I2510 Atherosclerotic heart disease of native coronary artery without angina pectoris: Secondary | ICD-10-CM | POA: Diagnosis not present

## 2023-06-17 ENCOUNTER — Encounter: Payer: Self-pay | Admitting: Cardiology

## 2023-06-17 ENCOUNTER — Telehealth: Payer: Self-pay

## 2023-06-17 MED ORDER — EZETIMIBE 10 MG PO TABS: 10.0000 mg | ORAL_TABLET | Freq: Every day | ORAL | 3 refills | Status: DC

## 2023-06-17 MED ORDER — PRAVASTATIN SODIUM 20 MG PO TABS: 20.0000 mg | ORAL_TABLET | Freq: Every evening | ORAL | 3 refills | Status: DC

## 2023-06-17 NOTE — Telephone Encounter (Signed)
-----   Message from Nona Dell sent at 06/17/2023  7:58 AM EDT ----- Results reviewed.  LDL has come down somewhat to 129 from 141 on Pravachol 10 mg daily.  Optimal LDL goal with vascular disease is 55 or less.  See if he would consider increasing Pravachol to 20 mg daily and adding back Zetia 10 mg daily.

## 2023-06-17 NOTE — Telephone Encounter (Signed)
Patient notified and verbalized understanding. Patient had no questions or concerns at this time.  

## 2023-08-26 DIAGNOSIS — E039 Hypothyroidism, unspecified: Secondary | ICD-10-CM | POA: Diagnosis not present

## 2023-08-26 DIAGNOSIS — I2510 Atherosclerotic heart disease of native coronary artery without angina pectoris: Secondary | ICD-10-CM | POA: Diagnosis not present

## 2023-08-26 DIAGNOSIS — E782 Mixed hyperlipidemia: Secondary | ICD-10-CM | POA: Diagnosis not present

## 2023-08-27 LAB — CMP14+EGFR
ALT: 22 [IU]/L (ref 0–44)
AST: 22 [IU]/L (ref 0–40)
Albumin: 4.6 g/dL (ref 3.8–4.8)
Alkaline Phosphatase: 54 [IU]/L (ref 44–121)
BUN/Creatinine Ratio: 22 (ref 10–24)
BUN: 24 mg/dL (ref 8–27)
Bilirubin Total: 0.9 mg/dL (ref 0.0–1.2)
CO2: 23 mmol/L (ref 20–29)
Calcium: 9.2 mg/dL (ref 8.6–10.2)
Chloride: 100 mmol/L (ref 96–106)
Creatinine, Ser: 1.08 mg/dL (ref 0.76–1.27)
Globulin, Total: 2.3 g/dL (ref 1.5–4.5)
Glucose: 95 mg/dL (ref 70–99)
Potassium: 4.3 mmol/L (ref 3.5–5.2)
Sodium: 138 mmol/L (ref 134–144)
Total Protein: 6.9 g/dL (ref 6.0–8.5)
eGFR: 71 mL/min/{1.73_m2} (ref >59)

## 2023-08-27 LAB — LIPID PANEL
Chol/HDL Ratio: 4.1 {ratio} (ref 0.0–5.0)
Cholesterol, Total: 155 mg/dL (ref 100–199)
HDL: 38 mg/dL — ABNORMAL LOW (ref >39)
LDL Chol Calc (NIH): 92 mg/dL (ref 0–99)
Triglycerides: 140 mg/dL (ref 0–149)
VLDL Cholesterol Cal: 25 mg/dL (ref 5–40)

## 2023-08-27 LAB — TSH+FREE T4
Free T4: 1.27 ng/dL (ref 0.82–1.77)
TSH: 5.53 u[IU]/mL — ABNORMAL HIGH (ref 0.450–4.500)

## 2023-09-01 ENCOUNTER — Ambulatory Visit (INDEPENDENT_AMBULATORY_CARE_PROVIDER_SITE_OTHER): Payer: Medicare Other | Admitting: Internal Medicine

## 2023-09-01 ENCOUNTER — Encounter: Payer: Self-pay | Admitting: Internal Medicine

## 2023-09-01 VITALS — BP 121/71 | HR 72 | Ht 69.0 in | Wt 184.6 lb

## 2023-09-01 DIAGNOSIS — E039 Hypothyroidism, unspecified: Secondary | ICD-10-CM

## 2023-09-01 DIAGNOSIS — R5382 Chronic fatigue, unspecified: Secondary | ICD-10-CM

## 2023-09-01 DIAGNOSIS — I2510 Atherosclerotic heart disease of native coronary artery without angina pectoris: Secondary | ICD-10-CM | POA: Diagnosis not present

## 2023-09-01 DIAGNOSIS — E782 Mixed hyperlipidemia: Secondary | ICD-10-CM

## 2023-09-01 MED ORDER — LEVOTHYROXINE SODIUM 75 MCG PO TABS: 75.0000 ug | ORAL_TABLET | Freq: Every day | ORAL | 1 refills | Status: DC

## 2023-09-01 NOTE — Assessment & Plan Note (Addendum)
Was on testosterone therapy in the past Chart review shows normal testosterone levels in the past Checked CMP, TSH, free T4 - adjusted dose of Levothyroxine MDD can also cause fatigue - he has been feeling caregiver stress

## 2023-09-01 NOTE — Assessment & Plan Note (Signed)
Asymptomatic currently On statin Followed by cardiology 

## 2023-09-01 NOTE — Patient Instructions (Signed)
Please start taking Levothyroxine 75 mcg once daily instead of 50 mcg.  Please start taking B complex multivitamin and Magnesium 250 mg once daily.  Please continue to take medications as prescribed.  Please continue to follow low salt diet and perform moderate exercise/walking as tolerated.  Please get fasting blood tests done before the next visit.

## 2023-09-01 NOTE — Progress Notes (Signed)
Established Patient Office Visit  Subjective:  Patient ID: Brad Cruz, male    DOB: Apr 23, 1946  Age: 77 y.o. MRN: [id number]  CC:  Chief Complaint  Patient presents with   Hypothyroidism    Four month follow up    chronic fatigue    Patient feels he has chronic fatigue    HPI Brad Cruz is a 77 y.o. male with past medical history of nonobstructive CAD, hypothyroidism and HLD who presents for f/u of his chronic medical conditions.  He takes pravastatin and Zetia for HLD and history of CAD. He denies any chest pain, dyspnea or palpitations.  He follows up with Dr. Diona Browner for history of CAD.  Hypothyroidism: He has been tolerating levothyroxine 50 mcg well.  He denies any recent change in weight or appetite.  He has chronic fatigue, which had improved since starting levothyroxine initially. His TSH is elevated now with free T4 WNL.  He also takes testosterone supplement from [facility].  His wife has been diagnosed with metastatic lung cancer, and he had been feeling stressed with it.  He reports mild anhedonia, but is manageable and denied referral to [facility] therapy for now.  Denies any SI or HI currently.  Past Medical History:  Diagnosis Date   Allergy    Mold, cat dander   Arthritis    cerv spine   Asthma    Childhood history   BPH (benign prostatic hyperplasia)    Cataract    Coronary atherosclerosis of native coronary artery    a. 06/2012 Myoview:  No ischemia;  b. 06/25/2012 Cath:  LM nl, LAD 50p, 64m, D1 80 ost (small), D2 nl (small), LCX 30 ost, OM1 nl, RI 30 ost, RCA 50-60- ost, 101m, PDA minor irregs, PL nl (small), EF 65%.    GERD (gastroesophageal reflux disease)    Hyperlipidemia    Hypothyroidism    Migraines    Pulmonary granuloma (HCC)    Chest CT 6/13   Skin cancer, basal cell    BCC face   Vasovagal syncope    Vitamin D deficiency disease 08/09/2019    Past Surgical History:  Procedure Laterality Date   BIOPSY  09/09/2017   Procedure: BIOPSY;   Surgeon: Malissa Hippo, MD;  [facility]: [facility];  Service: Endoscopy;;  gastric   CARDIAC CATHETERIZATION     COLONOSCOPY     Deviated septum repair 1989     ESOPHAGOGASTRODUODENOSCOPY N/A 09/09/2017   Procedure: ESOPHAGOGASTRODUODENOSCOPY (EGD);  Surgeon: Malissa Hippo, MD;  Location: [facility];  Service: Endoscopy;  Laterality: N/A;   LEFT HEART CATHETERIZATION WITH CORONARY ANGIOGRAM N/A 06/25/2012   Procedure: LEFT HEART CATHETERIZATION WITH CORONARY ANGIOGRAM;  Surgeon: [name], MD;  Location: [facility] CATH LAB;  Service: Cardiovascular;  Laterality: N/A;   LUMBAR LAMINECTOMY/DECOMPRESSION MICRODISCECTOMY  11/23/2012   Procedure: LUMBAR LAMINECTOMY/DECOMPRESSION MICRODISCECTOMY 1 LEVEL;  Surgeon: Karn Cassis, MD;  Location: [facility];  Service: Neurosurgery;  Laterality: Left;  Left Lumbar five-sacral one Diskectomy   SKIN CANCER EXCISION     Under left eye basal cell   SPINE SURGERY      Family History  Problem Relation Age of Onset   Heart disease Mother        age 29   Transient ischemic attack Mother    Stroke Mother    Stomach cancer Sister    Cancer Sister        age 42   Colon polyps Brother  Heart disease Brother    Thyroid cancer Brother    Alzheimer's disease Father        15   Colon cancer Neg Hx     Social History   Socioeconomic History   Marital status: Married    Spouse name: bettie   Number of children: 1   Years of education: 19   Highest education level: Professional school degree (e.g., MD, DDS, DVM, JD)  Occupational History   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  Tobacco Use   Smoking status: Never   Smokeless tobacco: Never  Vaping Use   Vaping status: Never Used  Substance and Sexual Activity   Alcohol use: No    Alcohol/week: 0.0 standard drinks of alcohol   Drug use: No   Sexual activity: Yes    Partners: Female    Birth control/protection: Post-menopausal   Other Topics Concern   Not on file  Social History Narrative   Lives with [place]   Married 35 years   Involved with church and the [employer]    Social Determinants of Health   Financial Resource Strain: Low Risk  (03/09/2023)   Overall Financial Resource Strain (CARDIA)    Difficulty of Paying Living Expenses: Not hard at all  Food Insecurity: No Food Insecurity (03/09/2023)   Hunger Vital Sign    Worried About Running Out of Food in the Last Year: Never true    Ran Out of Food in the Last Year: Never true  Transportation Needs: No Transportation Needs (03/09/2023)   PRAPARE - Administrator, Civil Service (Medical): No    Lack of Transportation (Non-Medical): No  Physical Activity: Insufficiently Active (03/09/2023)   Exercise Vital Sign    Days of Exercise per Week: 5 days    Minutes of Exercise per Session: 20 min  Stress: No Stress Concern Present (03/09/2023)   [employer] of Occupational Health - Occupational Stress Questionnaire    Feeling of Stress : Not at all  Social Connections: Moderately Integrated (03/09/2023)   Social Connection and Isolation Panel [NHANES]    Frequency of Communication with Friends and Family: Once a week    Frequency of Social Gatherings with Friends and Family: More than three times a week    Attends Religious Services: More than 4 times per year    Active Member of [employer] or Organizations: No    Attends Banker Meetings: Never    Marital Status: Married  Catering manager Violence: Not At Risk (03/09/2023)   Humiliation, Afraid, Rape, and Kick questionnaire    Fear of Current or Ex-Partner: No    Emotionally Abused: No    Physically Abused: No    Sexually Abused: No    Outpatient Medications Prior to Visit  Medication Sig Dispense Refill   acetaminophen (TYLENOL) 500 MG tablet Take 500 mg by mouth every 6 (six) hours as needed.     Cholecalciferol 125 MCG (5000 UT) capsule Take 10,000 Units by mouth daily.     ezetimibe  (ZETIA) 10 MG tablet Take 1 tablet (10 mg total) by mouth daily. 90 tablet 3   fexofenadine (ALLEGRA) 180 MG tablet Take 180 mg by mouth as needed for allergies or rhinitis.     pravastatin (PRAVACHOL) 20 MG tablet Take 1 tablet (20 mg total) by mouth every evening. 90 tablet 3   Saw Palmetto, Serenoa repens, 320 MG CAPS Take 640 mg at  bedtime by mouth.      vitamin B-12 (CYANOCOBALAMIN) 1000 MCG tablet Take 1,000 mcg by mouth daily.     levothyroxine (SYNTHROID) 50 MCG tablet TAKE 1 TABLET DAILY 90 tablet 3   No facility-administered medications prior to visit.    Allergies  Allergen Reactions   Citrus Hives    Avoid grapefruit, OJ   Ibuprofen Hives, Swelling and Rash   Shellfish-Derived Products Hives    Rash    Statins Other (See Comments)    ROS Review of Systems  Constitutional:  Positive for fatigue. Negative for chills and fever.  HENT:  Negative for congestion, postnasal drip, sinus pressure and sore throat.   Eyes:  Negative for pain and discharge.  Respiratory:  Negative for cough and shortness of breath.   Cardiovascular:  Negative for chest pain and palpitations.  Gastrointestinal:  Negative for diarrhea, nausea and vomiting.  Endocrine: Negative for polydipsia and polyuria.  Genitourinary:  Negative for dysuria and hematuria.  Musculoskeletal:  Negative for neck pain and neck stiffness.  Skin:  Negative for rash.  Neurological:  Negative for dizziness, weakness and numbness.  Psychiatric/Behavioral:  Positive for dysphoric mood. Negative for agitation and behavioral problems.       Objective:    Physical Exam Vitals reviewed.  Constitutional:      General: He is not in acute distress.    Appearance: He is not diaphoretic.  HENT:     Head: Normocephalic and atraumatic.     Nose: Nose normal.     Mouth/Throat:     Mouth: Mucous membranes are moist.  Eyes:     General: No scleral icterus.    Extraocular Movements: Extraocular movements intact.   Cardiovascular:     Rate and Rhythm: Normal rate and regular rhythm.     Pulses: Normal pulses.     Heart sounds: Normal heart sounds. No murmur heard. Pulmonary:     Breath sounds: Normal breath sounds. No wheezing or rales.  Musculoskeletal:     Cervical back: Neck supple. No tenderness.     Right lower leg: No edema.     Left lower leg: No edema.  Feet:     Left foot:     Toenail Condition: Left toenails are ingrown.  Skin:    General: Skin is warm.     Findings: No rash.  Neurological:     General: No focal deficit present.     Mental Status: He is alert and oriented to person, place, and time.     Sensory: No sensory deficit.     Motor: No weakness.  Psychiatric:        Mood and Affect: Mood normal.        Behavior: Behavior normal.     BP 121/71 (BP Location: Right Arm, Patient Position: Sitting, Cuff Size: Normal)   Pulse 72   Ht 5\' 9"  (1.753 m)   Wt 184 lb 9.6 oz (83.7 kg)   SpO2 96%   BMI 27.26 kg/m  Wt Readings from Last 3 Encounters:  09/01/23 184 lb 9.6 oz (83.7 kg)  06/10/23 185 lb (83.9 kg)  03/04/23 183 lb 12.8 oz (83.4 kg)    Lab Results  Component Value Date   TSH 5.530 (H) 08/26/2023   Lab Results  Component Value Date   WBC 6.2 11/21/2021   HGB 16.0 11/21/2021   HCT 48.2 11/21/2021   MCV 90 11/21/2021   PLT 211 11/21/2021   Lab Results  Component Value Date  NA 138 08/26/2023   K 4.3 08/26/2023   CO2 23 08/26/2023   GLUCOSE 95 08/26/2023   BUN 24 08/26/2023   CREATININE 1.08 08/26/2023   BILITOT 0.9 08/26/2023   ALKPHOS 54 08/26/2023   AST 22 08/26/2023   ALT 22 08/26/2023   PROT 6.9 08/26/2023   ALBUMIN 4.6 08/26/2023   CALCIUM 9.2 08/26/2023   ANIONGAP 7 09/09/2017   EGFR 71 08/26/2023   Lab Results  Component Value Date   CHOL 155 08/26/2023   Lab Results  Component Value Date   HDL 38 (L) 08/26/2023   Lab Results  Component Value Date   LDLCALC 92 08/26/2023   Lab Results  Component Value Date   TRIG 140  08/26/2023   Lab Results  Component Value Date   CHOLHDL 4.1 08/26/2023   Lab Results  Component Value Date   HGBA1C 5.8 (H) 11/13/2022      Assessment & Plan:   Problem List Items Addressed This Visit       Cardiovascular and Mediastinum   CAD (coronary artery disease), native coronary artery    Asymptomatic currently On statin Followed by cardiology        Endocrine   Hypothyroidism - Primary    Lab Results  Component Value Date   TSH 5.530 (H) 08/26/2023   On levothyroxine 50 mcg QD He has persistent fatigue Checked TSH and free T4 - increased dose of Levothyroxine to 75 mcg QD Fatigue had improved with thyroid supplement initially      Relevant Medications   levothyroxine (SYNTHROID) 75 MCG tablet   Other Relevant Orders   TSH + free T4     Other   Hyperlipidemia    On Crestor 5 mg twice in a week, did not tolerate daily dose in the past Switched to Pravastatin - tolerating better Check lipid profile - LDL improved to 92 now from 129 (08/24)      Chronic fatigue    Was on testosterone therapy in the past Chart review shows normal testosterone levels in the past Checked CMP, TSH, free T4 - adjusted dose of Levothyroxine MDD can also cause fatigue - he has been feeling caregiver stress      Relevant Orders   CBC with Differential/Platelet     Meds ordered this encounter  Medications   levothyroxine (SYNTHROID) 75 MCG tablet    Sig: Take 1 tablet (75 mcg total) by mouth daily before breakfast.    Dispense:  90 tablet    Refill:  1    Dose change    Follow-up: Return in about 4 months (around 01/01/2024) for Hypothyroidism.    Anabel Halon, MD

## 2023-09-01 NOTE — Assessment & Plan Note (Addendum)
On Crestor 5 mg twice in a week, did not tolerate daily dose in the past Switched to Pravastatin - tolerating better Check lipid profile - LDL improved to 92 now from 129 (08/24)

## 2023-09-01 NOTE — Assessment & Plan Note (Addendum)
Lab Results  Component Value Date   TSH 5.530 (H) 08/26/2023   On levothyroxine 50 mcg QD He has persistent fatigue Checked TSH and free T4 - increased dose of Levothyroxine to 75 mcg QD Fatigue had improved with thyroid supplement initially

## 2023-09-08 ENCOUNTER — Telehealth: Payer: Self-pay | Admitting: Internal Medicine

## 2023-09-08 NOTE — Telephone Encounter (Signed)
Patient called to let Dr Allena Katz know he got his flu shot today 11[facility]05.2024 [facility] [place]

## 2023-09-08 NOTE — Telephone Encounter (Signed)
Chart updated

## 2023-09-21 DIAGNOSIS — H35413 Lattice degeneration of retina, bilateral: Secondary | ICD-10-CM | POA: Diagnosis not present

## 2023-09-21 DIAGNOSIS — H524 Presbyopia: Secondary | ICD-10-CM | POA: Diagnosis not present

## 2023-11-24 DIAGNOSIS — D2261 Melanocytic nevi of right upper limb, including shoulder: Secondary | ICD-10-CM | POA: Diagnosis not present

## 2023-11-24 DIAGNOSIS — L905 Scar conditions and fibrosis of skin: Secondary | ICD-10-CM | POA: Diagnosis not present

## 2023-11-24 DIAGNOSIS — L218 Other seborrheic dermatitis: Secondary | ICD-10-CM | POA: Diagnosis not present

## 2023-11-24 DIAGNOSIS — D2271 Melanocytic nevi of right lower limb, including hip: Secondary | ICD-10-CM | POA: Diagnosis not present

## 2023-11-24 DIAGNOSIS — D2262 Melanocytic nevi of left upper limb, including shoulder: Secondary | ICD-10-CM | POA: Diagnosis not present

## 2023-11-24 DIAGNOSIS — Z85828 Personal history of other malignant neoplasm of skin: Secondary | ICD-10-CM | POA: Diagnosis not present

## 2023-11-24 DIAGNOSIS — D225 Melanocytic nevi of trunk: Secondary | ICD-10-CM | POA: Diagnosis not present

## 2023-11-24 DIAGNOSIS — D485 Neoplasm of uncertain behavior of skin: Secondary | ICD-10-CM | POA: Diagnosis not present

## 2023-11-24 DIAGNOSIS — D2272 Melanocytic nevi of left lower limb, including hip: Secondary | ICD-10-CM | POA: Diagnosis not present

## 2023-11-24 DIAGNOSIS — D1801 Hemangioma of skin and subcutaneous tissue: Secondary | ICD-10-CM | POA: Diagnosis not present

## 2023-11-24 DIAGNOSIS — L111 Transient acantholytic dermatosis [Grover]: Secondary | ICD-10-CM | POA: Diagnosis not present

## 2023-11-24 DIAGNOSIS — L821 Other seborrheic keratosis: Secondary | ICD-10-CM | POA: Diagnosis not present

## 2023-12-21 DIAGNOSIS — E039 Hypothyroidism, unspecified: Secondary | ICD-10-CM | POA: Diagnosis not present

## 2023-12-21 DIAGNOSIS — R5382 Chronic fatigue, unspecified: Secondary | ICD-10-CM | POA: Diagnosis not present

## 2023-12-22 LAB — TSH+FREE T4
Free T4: 1.41 ng/dL (ref 0.82–1.77)
TSH: 2.83 u[IU]/mL (ref 0.450–4.500)

## 2023-12-22 LAB — CBC WITH DIFFERENTIAL/PLATELET
Basophils Absolute: 0 10*3/uL (ref 0.0–0.2)
Basos: 1 %
EOS (ABSOLUTE): 0.1 10*3/uL (ref 0.0–0.4)
Eos: 2 %
Hematocrit: 46 % (ref 37.5–51.0)
Hemoglobin: 15.1 g/dL (ref 13.0–17.7)
Immature Grans (Abs): 0 10*3/uL (ref 0.0–0.1)
Immature Granulocytes: 0 %
Lymphocytes Absolute: 2.2 10*3/uL (ref 0.7–3.1)
Lymphs: 33 %
MCH: 30.4 pg (ref 26.6–33.0)
MCHC: 32.8 g/dL (ref 31.5–35.7)
MCV: 93 fL (ref 79–97)
Monocytes Absolute: 0.5 10*3/uL (ref 0.1–0.9)
Monocytes: 8 %
Neutrophils Absolute: 3.6 10*3/uL (ref 1.4–7.0)
Neutrophils: 56 %
Platelets: 230 10*3/uL (ref 150–450)
RBC: 4.97 x10E6/uL (ref 4.14–5.80)
RDW: 12.5 % (ref 11.6–15.4)
WBC: 6.6 10*3/uL (ref 3.4–10.8)

## 2023-12-31 ENCOUNTER — Ambulatory Visit (INDEPENDENT_AMBULATORY_CARE_PROVIDER_SITE_OTHER): Payer: Medicare Other | Admitting: Internal Medicine

## 2023-12-31 ENCOUNTER — Encounter: Payer: Self-pay | Admitting: Internal Medicine

## 2023-12-31 VITALS — BP 138/74 | HR 73 | Resp 16 | Ht 69.0 in | Wt 183.0 lb

## 2023-12-31 DIAGNOSIS — E039 Hypothyroidism, unspecified: Secondary | ICD-10-CM

## 2023-12-31 DIAGNOSIS — R5382 Chronic fatigue, unspecified: Secondary | ICD-10-CM

## 2023-12-31 DIAGNOSIS — E782 Mixed hyperlipidemia: Secondary | ICD-10-CM

## 2023-12-31 DIAGNOSIS — N138 Other obstructive and reflux uropathy: Secondary | ICD-10-CM | POA: Diagnosis not present

## 2023-12-31 DIAGNOSIS — N401 Enlarged prostate with lower urinary tract symptoms: Secondary | ICD-10-CM | POA: Diagnosis not present

## 2023-12-31 DIAGNOSIS — E559 Vitamin D deficiency, unspecified: Secondary | ICD-10-CM | POA: Diagnosis not present

## 2023-12-31 DIAGNOSIS — I2510 Atherosclerotic heart disease of native coronary artery without angina pectoris: Secondary | ICD-10-CM | POA: Diagnosis not present

## 2023-12-31 DIAGNOSIS — R7303 Prediabetes: Secondary | ICD-10-CM | POA: Diagnosis not present

## 2023-12-31 NOTE — Assessment & Plan Note (Signed)
 Lab Results  Component Value Date   HGBA1C 5.8 (H) 11/13/2022   Advised to continue to follow low carb diet

## 2023-12-31 NOTE — Assessment & Plan Note (Addendum)
 Overall improved Was on testosterone therapy in the past Chart review shows normal testosterone levels in the past Checked CMP, TSH, free T4 - adjusted dose of Levothyroxine MDD can also cause fatigue - he has been feeling caregiver stress

## 2023-12-31 NOTE — Assessment & Plan Note (Signed)
 [place]

## 2023-12-31 NOTE — Progress Notes (Signed)
 Established Patient Office Visit  Subjective:  Patient ID: Brad Cruz, male    DOB: [birth year]  Age: 78 y.o. MRN: [id number]  CC:  Chief Complaint  Patient presents with   Hypothyroidism    Follow up visit     HPI Brad Cruz is a 78 y.o. male with past medical history of nonobstructive CAD, hypothyroidism and HLD who presents for f/u of his chronic medical conditions.  He takes pravastatin and Zetia for HLD and history of CAD. He denies any chest pain, dyspnea or palpitations.  He follows up with Dr. Diona Browner for history of CAD.  Hypothyroidism: He has been tolerating levothyroxine 75 mcg well.  He denies any recent change in weight or appetite.  He has chronic fatigue, which has improved since increasing dose of levothyroxine. His TSH and free T4 are WNL.  He also takes testosterone supplement from [facility].  His wife has been diagnosed with metastatic lung cancer, and he had been feeling stressed with it.  He had mild anhedonia in the past, but is manageable now.  Denies any SI or HI currently.  Past Medical History:  Diagnosis Date   Allergy    Mold, cat dander   Arthritis    cerv spine   Asthma    Childhood history   BPH (benign prostatic hyperplasia)    Cataract    Coronary atherosclerosis of native coronary artery    a. 06/2012 Myoview:  No ischemia;  b. 06/25/2012 Cath:  LM nl, LAD 50p, 22m, D1 80 ost (small), D2 nl (small), LCX 30 ost, OM1 nl, RI 30 ost, RCA 50-60- ost, 73m, PDA minor irregs, PL nl (small), EF 65%.    GERD (gastroesophageal reflux disease)    Hyperlipidemia    Hypothyroidism    Migraines    Pulmonary granuloma (HCC)    Chest CT 6/13   Skin cancer, basal cell    BCC face   Vasovagal syncope    Vitamin D deficiency disease 08/09/2019    Past Surgical History:  Procedure Laterality Date   BIOPSY  09/09/2017   Procedure: BIOPSY;  Surgeon: Malissa Hippo, MD;  [facility];  Service: Endoscopy;;  gastric   CARDIAC  CATHETERIZATION     COLONOSCOPY     Deviated septum repair 1989     ESOPHAGOGASTRODUODENOSCOPY N/A 09/09/2017   Procedure: ESOPHAGOGASTRODUODENOSCOPY (EGD);  Surgeon: Malissa Hippo, MD;  [facility];  Service: Endoscopy;  Laterality: N/A;   LEFT HEART CATHETERIZATION WITH CORONARY ANGIOGRAM N/A 06/25/2012   Procedure: LEFT HEART CATHETERIZATION WITH CORONARY ANGIOGRAM;  Surgeon: [name], MD;  Location: [facility] CATH LAB;  Service: Cardiovascular;  Laterality: N/A;   LUMBAR LAMINECTOMY/DECOMPRESSION MICRODISCECTOMY  11/23/2012   Procedure: LUMBAR LAMINECTOMY/DECOMPRESSION MICRODISCECTOMY 1 LEVEL;  Surgeon: Karn Cassis, MD;  Location: [facility];  Service: Neurosurgery;  Laterality: Left;  Left Lumbar five-sacral one Diskectomy   SKIN CANCER EXCISION     Under left eye basal cell   SPINE SURGERY      Family History  Problem Relation Age of Onset   Heart disease Mother        age 30   Transient ischemic attack Mother    Stroke Mother    Stomach cancer Sister    Cancer Sister        age 25   Colon polyps Brother    Heart disease Brother    Thyroid cancer Brother    Alzheimer's disease Father  85   Colon cancer Neg Hx     Social History   Socioeconomic History   Marital status: Married    Spouse name: bettie   Number of children: 1   Years of education: 19   Highest education level: Professional school degree (e.g., MD, DDS, DVM, JD)  Occupational History   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  Tobacco Use   Smoking status: Never   Smokeless tobacco: Never  Vaping Use   Vaping status: Never Used  Substance and Sexual Activity   Alcohol use: No    Alcohol/week: 0.0 standard drinks of alcohol   Drug use: No   Sexual activity: Yes    Partners: Female    Birth control/protection: Post-menopausal  Other Topics Concern   Not on file  Social History Narrative   Lives with [place]   Married 35  years   Involved with church and the [employer]    Social Drivers of Health   Financial Resource Strain: Low Risk  (03/09/2023)   Overall Financial Resource Strain (CARDIA)    Difficulty of Paying Living Expenses: Not hard at all  Food Insecurity: No Food Insecurity (03/09/2023)   Hunger Vital Sign    Worried About Running Out of Food in the Last Year: Never true    Ran Out of Food in the Last Year: Never true  Transportation Needs: No Transportation Needs (03/09/2023)   PRAPARE - Administrator, Civil Service (Medical): No    Lack of Transportation (Non-Medical): No  Physical Activity: Insufficiently Active (03/09/2023)   Exercise Vital Sign    Days of Exercise per Week: 5 days    Minutes of Exercise per Session: 20 min  Stress: No Stress Concern Present (03/09/2023)   [employer] of Occupational Health - Occupational Stress Questionnaire    Feeling of Stress : Not at all  Social Connections: Moderately Integrated (03/09/2023)   Social Connection and Isolation Panel [NHANES]    Frequency of Communication with Friends and Family: Once a week    Frequency of Social Gatherings with Friends and Family: More than three times a week    Attends Religious Services: More than 4 times per year    Active Member of [employer] or Organizations: No    Attends Banker Meetings: Never    Marital Status: Married  Catering manager Violence: Not At Risk (03/09/2023)   Humiliation, Afraid, Rape, and Kick questionnaire    Fear of Current or Ex-Partner: No    Emotionally Abused: No    Physically Abused: No    Sexually Abused: No    Outpatient Medications Prior to Visit  Medication Sig Dispense Refill   Cholecalciferol 125 MCG (5000 UT) capsule Take 10,000 Units by mouth daily.     ezetimibe (ZETIA) 10 MG tablet Take 1 tablet (10 mg total) by mouth daily. 90 tablet 3   fexofenadine (ALLEGRA) 180 MG tablet Take 180 mg by mouth as needed for allergies or rhinitis.     levothyroxine  (SYNTHROID) 75 MCG tablet Take 1 tablet (75 mcg total) by mouth daily before breakfast. 90 tablet 1   pravastatin (PRAVACHOL) 20 MG tablet Take 1 tablet (20 mg total) by mouth every evening. 90 tablet 3   Saw Palmetto, Serenoa repens, 320 MG CAPS Take 640 mg at bedtime by mouth.      vitamin B-12 (CYANOCOBALAMIN) 1000 MCG tablet Take 1,000 mcg by mouth daily.  acetaminophen (TYLENOL) 500 MG tablet Take 500 mg by mouth every 6 (six) hours as needed. (Patient not taking: Reported on 12/31/2023)     No facility-administered medications prior to visit.    Allergies  Allergen Reactions   Citrus Hives    Avoid grapefruit, OJ   Ibuprofen Hives, Swelling and Rash   Shellfish-Derived Products Hives    Rash    Statins Other (See Comments)    ROS Review of Systems  Constitutional:  Positive for fatigue. Negative for chills and fever.  HENT:  Negative for congestion, postnasal drip, sinus pressure and sore throat.   Eyes:  Negative for pain and discharge.  Respiratory:  Negative for cough and shortness of breath.   Cardiovascular:  Negative for chest pain and palpitations.  Gastrointestinal:  Negative for diarrhea, nausea and vomiting.  Endocrine: Negative for polydipsia and polyuria.  Genitourinary:  Negative for dysuria and hematuria.  Musculoskeletal:  Negative for neck pain and neck stiffness.  Skin:  Negative for rash.  Neurological:  Negative for dizziness, weakness and numbness.  Psychiatric/Behavioral:  Negative for agitation and behavioral problems.       Objective:    Physical Exam Vitals reviewed.  Constitutional:      General: He is not in acute distress.    Appearance: He is not diaphoretic.  HENT:     Head: Normocephalic and atraumatic.     Nose: Nose normal.     Mouth/Throat:     Mouth: Mucous membranes are moist.  Eyes:     General: No scleral icterus.    Extraocular Movements: Extraocular movements intact.  Cardiovascular:     Rate and Rhythm: Normal rate  and regular rhythm.     Heart sounds: Normal heart sounds. No murmur heard. Pulmonary:     Breath sounds: Normal breath sounds. No wheezing or rales.  Musculoskeletal:     Cervical back: Neck supple. No tenderness.     Right lower leg: No edema.     Left lower leg: No edema.  Feet:     Left foot:     Toenail Condition: Left toenails are ingrown.  Skin:    General: Skin is warm.     Findings: No rash.  Neurological:     General: No focal deficit present.     Mental Status: He is alert and oriented to person, place, and time.     Sensory: No sensory deficit.     Motor: No weakness.  Psychiatric:        Mood and Affect: Mood normal.        Behavior: Behavior normal.     BP 138/74   Pulse 73   Resp 16   Ht 5\' 9"  (1.753 m)   Wt 183 lb (83 kg)   SpO2 96%   BMI 27.02 kg/m  Wt Readings from Last 3 Encounters:  12/31/23 183 lb (83 kg)  09/01/23 184 lb 9.6 oz (83.7 kg)  06/10/23 185 lb (83.9 kg)    Lab Results  Component Value Date   TSH 2.830 12/21/2023   Lab Results  Component Value Date   WBC 6.6 12/21/2023   HGB 15.1 12/21/2023   HCT 46.0 12/21/2023   MCV 93 12/21/2023   PLT 230 12/21/2023   Lab Results  Component Value Date   NA 138 08/26/2023   K 4.3 08/26/2023   CO2 23 08/26/2023   GLUCOSE 95 08/26/2023   BUN 24 08/26/2023   CREATININE 1.08 08/26/2023   BILITOT 0.9 08/26/2023  ALKPHOS 54 08/26/2023   AST 22 08/26/2023   ALT 22 08/26/2023   PROT 6.9 08/26/2023   ALBUMIN 4.6 08/26/2023   CALCIUM 9.2 08/26/2023   ANIONGAP 7 09/09/2017   EGFR 71 08/26/2023   Lab Results  Component Value Date   CHOL 155 08/26/2023   Lab Results  Component Value Date   HDL 38 (L) 08/26/2023   Lab Results  Component Value Date   LDLCALC 92 08/26/2023   Lab Results  Component Value Date   TRIG 140 08/26/2023   Lab Results  Component Value Date   CHOLHDL 4.1 08/26/2023   Lab Results  Component Value Date   HGBA1C 5.8 (H) 11/13/2022      Assessment &  Plan:   Problem List Items Addressed This Visit       Cardiovascular and Mediastinum   CAD (coronary artery disease), native coronary artery   Asymptomatic currently On statin Followed by cardiology      Relevant Orders   CMP14+EGFR     Endocrine   Hypothyroidism - Primary   Lab Results  Component Value Date   TSH 2.830 12/21/2023   On levothyroxine 75 mcg QD He had persistent fatigue, now better Checked TSH and free T4      Relevant Orders   TSH + free T4     Genitourinary   BPH with obstruction/lower urinary tract symptoms   Takes Saw Palmetto      Relevant Orders   PSA     Other   Hyperlipidemia   On Pravastatin - tolerating better than Crestor Check lipid profile - LDL improved to 92 from 129 (08/24)      Relevant Orders   CMP14+EGFR   Lipid Profile   Vitamin D deficiency   Relevant Orders   Vitamin D (25 hydroxy)   Chronic fatigue   Overall improved Was on testosterone therapy in the past Chart review shows normal testosterone levels in the past Checked CMP, TSH, free T4 - adjusted dose of Levothyroxine MDD can also cause fatigue - he has been feeling caregiver stress      Prediabetes   Lab Results  Component Value Date   HGBA1C 5.8 (H) 11/13/2022   Advised to continue to follow low carb diet      Relevant Orders   CMP14+EGFR   Hemoglobin A1c      No orders of the defined types were placed in this encounter.   Follow-up: Return in about 6 months (around 06/29/2024) for Hypothyroidism.    Anabel Halon, MD

## 2023-12-31 NOTE — Assessment & Plan Note (Addendum)
 Lab Results  Component Value Date   TSH 2.830 12/21/2023   On levothyroxine 75 mcg QD He had persistent fatigue, now better Checked TSH and free T4

## 2023-12-31 NOTE — Patient Instructions (Signed)
 Please continue to take medications as prescribed.  Please continue to follow low carb diet and ambulate as tolerated.  Please get fasting blood tests done before the next visit.

## 2023-12-31 NOTE — Assessment & Plan Note (Signed)
Asymptomatic currently On statin Followed by cardiology 

## 2023-12-31 NOTE — Assessment & Plan Note (Signed)
 On Pravastatin - tolerating better than Crestor Check lipid profile - LDL improved to 92 from 129 (08/24)

## 2024-02-15 ENCOUNTER — Other Ambulatory Visit: Payer: Self-pay | Admitting: Internal Medicine

## 2024-02-15 DIAGNOSIS — E039 Hypothyroidism, unspecified: Secondary | ICD-10-CM

## 2024-03-15 ENCOUNTER — Ambulatory Visit: Payer: Medicare Other

## 2024-03-15 VITALS — Ht 69.5 in | Wt 178.0 lb

## 2024-03-15 DIAGNOSIS — Z0000 Encounter for general adult medical examination without abnormal findings: Secondary | ICD-10-CM

## 2024-03-15 NOTE — Patient Instructions (Signed)
 [name] , Thank you for taking time out of your busy schedule to complete your Annual Wellness Visit with me. I enjoyed our conversation and look forward to speaking with you again next year. I, as well as your care team,  appreciate your ongoing commitment to your health goals. Please review the following plan we discussed and let me know if I can assist you in the future. Your Game plan/ To Do List    Referrals: If you haven't heard from the office you've been referred to, please reach out to them at the phone number provided.  N/A Follow up Visits: Next Medicare AWV with our clinical staff: Mar 20, 2025 at 9:20 am video visit   Have you seen your provider in the last 6 months (3 months if uncontrolled diabetes)? Yes Next Office Visit with your provider: Mar 16, 2024 at 1:40 pm to discuss you increased fatigue with Dr. Lydia Sams  Clinician Recommendations:  Aim for 30 minutes of exercise or brisk walking, 6-8 glasses of water , and 5 servings of fruits and vegetables each day.  Keep your appointment with Dr. Lydia Sams tomorrow. Please reach out if you need any caregiver support.       This is a list of the screening recommended for you and due dates:  Health Maintenance  Topic Date Due   COVID-19 Vaccine (4 - 2024-25 season) 07/05/2023   Flu Shot  06/03/2024   Medicare Annual Wellness Visit  03/15/2025   DTaP/Tdap/Td vaccine (2 - Td or Tdap) 02/15/2030   Pneumonia Vaccine  Completed   Hepatitis C Screening  Completed   Zoster (Shingles) Vaccine  Completed   HPV Vaccine  Aged Out   Meningitis B Vaccine  Aged Out   Colon Cancer Screening  Discontinued    Advanced directives: (Copy Requested) Please bring a copy of your health care power of attorney and living will to the office to be added to your chart at your convenience. You can mail to [facility] [address] [place], [place] [address] or email to [email or web address] Advance Care Planning is important because  it:  [x]  Makes sure you receive the medical care that is consistent with your values, goals, and preferences  [x]  It provides guidance to your family and loved ones and reduces their decisional burden about whether or not they are making the right decisions based on your wishes.  Follow the link provided in your after visit summary or read over the paperwork we have mailed to you to help you started getting your Advance Directives in place. If you need assistance in completing these, please reach out to us  so that we can help you!  See attachments for Preventive Care and Fall Prevention Tips.   Understanding Your Risk for Falls Millions of people have serious injuries from falls each year. It is important to understand your risk of falling. Talk with your health care provider about your risk and what you can do to lower it. If you do have a serious fall, make sure to tell your provider. Falling once raises your risk of falling again. How can falls affect me? Serious injuries from falls are common. These include: Broken bones, such as hip fractures. Head injuries, such as traumatic brain injuries (TBI) or concussions. A fear of falling can cause you to avoid activities and stay at home. This can make your muscles weaker and raise your risk for a fall. What can increase my risk? There are a number of  risk factors that increase your risk for falling. The more risk factors you have, the higher your risk of falling. Serious injuries from a fall happen most often to people who are older than 78 years old. Teenagers and young adults ages 61-29 are also at higher risk. Common risk factors include: Weakness in the lower body. Being generally weak or confused due to long-term (chronic) illness. Dizziness or balance problems. Poor vision. Medicines that cause dizziness or drowsiness. These may include: Medicines for your blood pressure, heart, anxiety, insomnia, or swelling (edema). Pain  medicines. Muscle relaxants. Other risk factors include: Drinking alcohol . Having had a fall in the past. Having foot pain or wearing improper footwear. Working at a dangerous job. Having any of the following in your home: Tripping hazards, such as floor clutter or loose rugs. Poor lighting. Pets. Having dementia or memory loss. What actions can I take to lower my risk of falling?     Physical activity Stay physically fit. Do strength and balance exercises. Consider taking a regular class to build strength and balance. Yoga and tai chi are good options. Vision Have your eyes checked every year and your prescription for glasses or contacts updated as needed. Shoes and walking aids Wear non-skid shoes. Wear shoes that have rubber soles and low heels. Do not wear high heels. Do not walk around the house in socks or slippers. Use a cane or walker as told by your provider. Home safety Attach secure railings on both sides of your stairs. Install grab bars for your bathtub, shower, and toilet. Use a non-skid mat in your bathtub or shower. Attach bath mats securely with double-sided, non-slip rug tape. Use good lighting in all rooms. Keep a flashlight near your bed. Make sure there is a clear path from your bed to the bathroom. Use night-lights. Do not use throw rugs. Make sure all carpeting is taped or tacked down securely. Remove all clutter from walkways and stairways, including extension cords. Repair uneven or broken steps and floors. Avoid walking on icy or slippery surfaces. Walk on the grass instead of on icy or slick sidewalks. Use ice melter to get rid of ice on walkways in the winter. Use a cordless phone. Questions to ask your health care provider Can you help me check my risk for a fall? Do any of my medicines make me more likely to fall? Should I take a vitamin D  supplement? What exercises can I do to improve my strength and balance? Should I make an appointment to have  my vision checked? Do I need a bone density test to check for weak bones (osteoporosis)? Would it help to use a cane or a walker? Where to find more information Centers for Disease Control and Prevention, STEADI: TonerPromos.no Community-Based Fall Prevention Programs: TonerPromos.no [employer] on Aging: BaseRingTones.pl Contact a health care provider if: You fall at home. You are afraid of falling at home. You feel weak, drowsy, or dizzy. This information is not intended to replace advice given to you by your health care provider. Make sure you discuss any questions you have with your health care provider. Document Revised: 06/23/2022 Document Reviewed: 06/23/2022 Elsevier Patient Education  2024 [employer].

## 2024-03-15 NOTE — Progress Notes (Signed)
 Subjective:   Brad Cruz is a 78 y.o. who presents for a Medicare Wellness preventive visit.  As a reminder, Annual Wellness Visits don't include a physical exam, and some assessments may be limited, especially if this visit is performed virtually. We may recommend an in-person visit if needed.  Visit Complete: Virtual I connected with  Brad Cruz on 03/15/24 by a audio enabled telemedicine application and verified that I am speaking with the correct person using two identifiers.  Patient Location: Home  Provider Location: Home Office  I discussed the limitations of evaluation and management by telemedicine. The patient expressed understanding and agreed to proceed.  Vital Signs: Because this visit was a virtual/telehealth visit, some criteria may be missing or patient reported. Any vitals not documented were not able to be obtained and vitals that have been documented are patient reported.  VideoDeclined- This patient declined Librarian, academic. Therefore the visit was completed with audio only.  Persons Participating in Visit: Patient.  AWV Questionnaire: No: Patient Medicare AWV questionnaire was not completed prior to this visit.  Cardiac Risk Factors include: advanced age (>70men, >21 women);dyslipidemia;sedentary lifestyle;male gender     Objective:     Today's Vitals   03/15/24 1010  Weight: 178 lb (80.7 kg)  Height: 5' 9.5" (1.765 m)   Body mass index is 25.91 kg/m.     03/15/2024   10:13 AM 03/09/2023   10:56 AM 03/03/2022    9:24 AM 02/28/2021    9:37 AM 02/16/2020    2:07 PM 09/09/2017    4:13 PM 09/09/2017   12:02 PM  Advanced Directives  Does Patient Have a Medical Advance Directive? Yes Yes Yes Yes Yes Yes No  Type of Estate agent of [place];Living will Living will Living will Healthcare Power of [place];Living will  Healthcare Power of [place];Living will   Does patient want to make changes to  medical advance directive?  No - Patient declined Yes (Inpatient - patient defers changing a medical advance directive at this time - Information given) No - Patient declined Yes (MAU/Ambulatory/Procedural Areas - Information given) No - Patient declined   Copy of Healthcare Power of Attorney in Chart? No - copy requested    Yes - validated most recent copy scanned in chart (See row information) No - copy requested   Would patient like information on creating a medical advance directive?  No - Patient declined         Current Medications (verified) Outpatient Encounter Medications as of 03/15/2024  Medication Sig   Cholecalciferol 125 MCG (5000 UT) capsule Take 10,000 Units by mouth daily.   ezetimibe  (ZETIA ) 10 MG tablet Take 1 tablet (10 mg total) by mouth daily.   levothyroxine  (SYNTHROID ) 75 MCG tablet TAKE 1 TABLET DAILY BEFORE BREAKFAST (DOSE CHANGE)   OVER THE COUNTER MEDICATION Take 1 capsule by mouth daily. Life Extension Testosterone  Elite   pravastatin  (PRAVACHOL ) 20 MG tablet Take 1 tablet (20 mg total) by mouth every evening.   Psyllium (METAMUCIL) 0.36 g CAPS Take 4 capsules by mouth daily.   Saw Palmetto, Serenoa repens, 320 MG CAPS Take 640 mg at bedtime by mouth.    vitamin B-12 (CYANOCOBALAMIN) 1000 MCG tablet Take 1,000 mcg by mouth daily.   acetaminophen  (TYLENOL ) 500 MG tablet Take 500 mg by mouth every 6 (six) hours as needed. (Patient not taking: Reported on 03/15/2024)   fexofenadine (ALLEGRA) 180 MG tablet Take 180 mg by mouth as needed for  allergies or rhinitis. (Patient not taking: Reported on 03/15/2024)   No facility-administered encounter medications on file as of 03/15/2024.    Allergies (verified) Citrus, Ibuprofen, Shellfish-derived products, and Statins   History: Past Medical History:  Diagnosis Date   Allergy    Mold, cat dander   Arthritis    cerv spine   Asthma    Childhood history   BPH (benign prostatic hyperplasia)    Cataract    Coronary  atherosclerosis of native coronary artery    a. 06/2012 Myoview :  No ischemia;  b. 06/25/2012 Cath:  LM nl, LAD 50p, 28m, D1 80 ost (small), D2 nl (small), LCX 30 ost, OM1 nl, RI 30 ost, RCA 50-60- ost, 77m, PDA minor irregs, PL nl (small), EF 65%.    GERD (gastroesophageal reflux disease)    Hyperlipidemia    Hypothyroidism    Migraines    Pulmonary granuloma (HCC)    Chest CT 6/13   Skin cancer, basal cell    BCC face   Vasovagal syncope    Vitamin D  deficiency disease 08/09/2019   Past Surgical History:  Procedure Laterality Date   BIOPSY  09/09/2017   Procedure: BIOPSY;  Surgeon: Ruby Corporal, MD;  Location: [facility];  Service: Endoscopy;;  gastric   CARDIAC CATHETERIZATION     COLONOSCOPY     Deviated septum repair 1989     ESOPHAGOGASTRODUODENOSCOPY N/A 09/09/2017   Procedure: ESOPHAGOGASTRODUODENOSCOPY (EGD);  Surgeon: Ruby Corporal, MD;  Location: [facility];  Service: Endoscopy;  Laterality: N/A;   LEFT HEART CATHETERIZATION WITH CORONARY ANGIOGRAM N/A 06/25/2012   Procedure: LEFT HEART CATHETERIZATION WITH CORONARY ANGIOGRAM;  Surgeon: Eilleen Grates, MD;  Location: [facility] CATH LAB;  Service: Cardiovascular;  Laterality: N/A;   LUMBAR LAMINECTOMY/DECOMPRESSION MICRODISCECTOMY  11/23/2012   Procedure: LUMBAR LAMINECTOMY/DECOMPRESSION MICRODISCECTOMY 1 LEVEL;  Surgeon: Adelbert Adler, MD;  Location: [facility]UR[facility];  Service: Neurosurgery;  Laterality: Left;  Left Lumbar five-sacral one Diskectomy   SKIN CANCER EXCISION     Under left eye basal cell   SPINE SURGERY     Family History  Problem Relation Age of Onset   Heart disease Mother        age 35   Transient ischemic attack Mother    Stroke Mother    Stomach cancer Sister    Cancer Sister        age 38   Colon polyps Brother    Heart disease Brother    Thyroid  cancer Brother    Alzheimer's disease Father        88   Colon cancer Neg Hx    Social History   Socioeconomic History   Marital status: Married     Spouse name: Brad Cruz   Number of children: 1   Years of education: 20   Highest education level: Professional school degree (e.g., MD, DDS, DVM, JD)  Occupational History   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  Tobacco Use   Smoking status: Never   Smokeless tobacco: Never  Vaping Use   Vaping status: Never Used  Substance and Sexual Activity   Alcohol  use: No    Alcohol /week: 0.0 standard drinks of alcohol    Drug use: No   Sexual activity: Yes    Partners: Female    Birth control/protection: Post-menopausal  Other Topics Concern   Not on file  Social History Narrative   Lives with [place]  Married 35 years   Involved with church and the [employer]    Social Drivers of Health   Financial Resource Strain: Low Risk  (03/15/2024)   Overall Financial Resource Strain (CARDIA)    Difficulty of Paying Living Expenses: Not hard at all  Food Insecurity: No Food Insecurity (03/15/2024)   Hunger Vital Sign    Worried About Running Out of Food in the Last Year: Never true    Ran Out of Food in the Last Year: Never true  Transportation Needs: No Transportation Needs (03/15/2024)   PRAPARE - Administrator, Civil Service (Medical): No    Lack of Transportation (Non-Medical): No  Physical Activity: Inactive (03/15/2024)   Exercise Vital Sign    Days of Exercise per Week: 0 days    Minutes of Exercise per Session: 0 min  Stress: No Stress Concern Present (03/15/2024)   [employer] of Occupational Health - Occupational Stress Questionnaire    Feeling of Stress : Not at all  Social Connections: Socially Integrated (03/15/2024)   Social Connection and Isolation Panel [NHANES]    Frequency of Communication with Friends and Family: Three times a week    Frequency of Social Gatherings with Friends and Family: Once a week    Attends Religious Services: More than 4 times per year    Active Member of [employer] or Organizations: Yes     Attends Engineer, structural: More than 4 times per year    Marital Status: Married    Tobacco Counseling Counseling given: Yes    Clinical Intake:  Pre-visit preparation completed: Yes  Pain : No/denies pain     BMI - recorded: 25.91 Nutritional Status: BMI 25 -29 Overweight Nutritional Risks: None Diabetes: No  Lab Results  Component Value Date   HGBA1C 5.8 (H) 11/13/2022   HGBA1C 6.0 (H) 11/21/2021     How often do you need to have someone help you when you read instructions, pamphlets, or other written materials from your doctor or pharmacy?: 1 - Never  Interpreter Needed?: No  Information entered by :: Brad Cruz CMA   Activities of Daily Living     03/15/2024   10:11 AM  In your present state of health, do you have any difficulty performing the following activities:  Hearing? 0  Vision? 0  Difficulty concentrating or making decisions? 0  Walking or climbing stairs? 0  Dressing or bathing? 0  Doing errands, shopping? 0  Preparing Food and eating ? N  Using the Toilet? N  In the past six months, have you accidently leaked urine? N  Do you have problems with loss of bowel control? N  Managing your Medications? N  Managing your Finances? N  Housekeeping or managing your Housekeeping? N    Patient Care Team: Meldon Sport, MD as PCP - General (Internal Medicine) Gerard Knight, MD as PCP - Cardiology (Cardiology)  Indicate any recent Medical Services you may have received from other than [facility] providers in the past year (date may be approximate).     Assessment:    This is a routine wellness examination for Brad Cruz.  Hearing/Vision screen Hearing Screening - Comments:: Patient denies any hearing difficulties.   Vision Screening - Comments:: Wears rx glasses - up to date with routine eye exams  Patient sees Patria Bookbinder, [facility]    Goals Addressed             This Visit's Progress    Patient Stated  On track    Take care  of my wife.       Depression Screen     03/15/2024   10:18 AM 12/31/2023   11:03 AM 09/01/2023   10:33 AM 03/09/2023   10:57 AM 03/09/2023   10:55 AM 03/04/2023    9:11 AM 11/19/2022   10:55 AM  PHQ 2/9 Scores  PHQ - 2 Score 0 0 0 0 0 0 0  PHQ- 9 Score 3   0 0 0     Fall Risk     03/15/2024   10:14 AM 12/31/2023   11:03 AM 09/01/2023   10:33 AM 03/09/2023   10:56 AM 03/08/2023    2:43 PM  Fall Risk   Falls in the past year? 0 0 0 0 0  Number falls in past yr: 0 0 0 0 0  Injury with Fall? 0 0 0  0  Risk for fall due to : No Fall Risks  No Fall Risks No Fall Risks   Follow up Falls prevention discussed;Falls evaluation completed  Falls evaluation completed Falls evaluation completed     MEDICARE RISK AT HOME:  Medicare Risk at Home Any stairs in or around the home?: Yes If so, are there any without handrails?: No Home free of loose throw rugs in walkways, pet beds, electrical cords, etc?: Yes Adequate lighting in your home to reduce risk of falls?: Yes Life alert?: No Use of a cane, walker or w/c?: No Grab bars in the bathroom?: No Shower chair or bench in shower?: No Elevated toilet seat or a handicapped toilet?: No  TIMED UP AND GO:  Was the test performed?  No  Cognitive Function: 6CIT completed    03/09/2023   10:57 AM 03/03/2022    9:25 AM  MMSE - Mini Mental State Exam  Not completed: Unable to complete Unable to complete        03/15/2024   10:15 AM 03/09/2023   10:57 AM 03/03/2022    9:25 AM 02/28/2021    9:40 AM 02/16/2020    2:10 PM  6CIT Screen  What Year? 0 points 0 points 0 points 0 points 0 points  What month? 0 points 0 points 0 points 0 points 0 points  What time? 0 points 0 points 0 points 0 points 0 points  Count back from 20 0 points 0 points 0 points 0 points 0 points  Months in reverse 0 points 0 points 0 points 0 points 0 points  Repeat phrase 0 points 0 points 0 points 2 points 0 points  Total Score 0 points 0 points 0 points 2 points 0 points     Immunizations Immunization History  Administered Date(s) Administered   Fluad Quad(high Dose 65+) 09/08/2019, 08/29/2020, 10/02/2021, 09/08/2022, 09/08/2023   Influenza Split 11/24/2012, 08/12/2017   Influenza,inj,Quad PF,6+ Mos 08/27/2016   Moderna Sars-Covid-2 Vaccination 12/09/2019, 01/07/2020, 09/03/2020   Pneumococcal Conjugate-13 11/19/2017   Pneumococcal Polysaccharide-23 11/24/2012, 02/28/2021   RSV,unspecified 10/31/2022   Tdap 02/16/2020   Zoster Recombinant(Shingrix) 08/01/2019, 08/31/2019    Screening Tests Health Maintenance  Topic Date Due   COVID-19 Vaccine (4 - 2024-25 season) 07/05/2023   INFLUENZA VACCINE  06/03/2024   Medicare Annual Wellness (AWV)  03/15/2025   DTaP/Tdap/Td (2 - Td or Tdap) 02/15/2030   Pneumonia Vaccine 47+ Years old  Completed   Hepatitis C Screening  Completed   Zoster Vaccines- Shingrix  Completed   HPV VACCINES  Aged Out   Meningococcal B Vaccine  Aged Out   Colonoscopy  Discontinued    Health Maintenance  Health Maintenance Due  Topic Date Due   COVID-19 Vaccine (4 - 2024-25 season) 07/05/2023   Health Maintenance Items Addressed: Patient advised of recommended vaccines and where to obtain those vaccines with verbal understanding  Additional Screening:  Vision Screening: Recommended annual ophthalmology exams for early detection of glaucoma and other disorders of the eye.  Dental Screening: Recommended annual dental exams for proper oral hygiene  Community Resource Referral / Chronic Care Management: CRR required this visit?  No   CCM required this visit?  No   Plan:    I have personally reviewed and noted the following in the patient's chart:   Medical and social history Use of alcohol , tobacco or illicit drugs  Current medications and supplements including opioid prescriptions. Patient is not currently taking opioid prescriptions. Functional ability and status Nutritional status Physical activity Advanced  directives List of other physicians Hospitalizations, surgeries, and ER visits in previous 12 months Vitals Screenings to include cognitive, depression, and falls Referrals and appointments  In addition, I have reviewed and discussed with patient certain preventive protocols, quality metrics, and best practice recommendations. A written personalized care plan for preventive services as well as general preventive health recommendations were provided to patient.   Gaelen Brager, CMA   03/15/2024   After Visit Summary: (MyChart) Due to this being a telephonic visit, the after visit summary with patients personalized plan was offered to patient via MyChart   Notes: Patient scheduled with pcp tomorrow 03/16/24 at 1:40 pm to discuss increased fatigue. Provider made aware through secure chat.

## 2024-03-16 ENCOUNTER — Encounter: Payer: Self-pay | Admitting: Internal Medicine

## 2024-03-16 ENCOUNTER — Ambulatory Visit (INDEPENDENT_AMBULATORY_CARE_PROVIDER_SITE_OTHER): Admitting: Internal Medicine

## 2024-03-16 VITALS — BP 124/70 | HR 68 | Ht 69.0 in | Wt 182.8 lb

## 2024-03-16 DIAGNOSIS — R7303 Prediabetes: Secondary | ICD-10-CM | POA: Diagnosis not present

## 2024-03-16 DIAGNOSIS — R5382 Chronic fatigue, unspecified: Secondary | ICD-10-CM

## 2024-03-16 DIAGNOSIS — E538 Deficiency of other specified B group vitamins: Secondary | ICD-10-CM

## 2024-03-16 DIAGNOSIS — E039 Hypothyroidism, unspecified: Secondary | ICD-10-CM | POA: Diagnosis not present

## 2024-03-16 NOTE — Progress Notes (Unsigned)
 Established Patient Office Visit  Subjective:  Patient ID: Brad Cruz, male    DOB: [birth year]  Age: 78 y.o. MRN: [id number]  CC:  Chief Complaint  Patient presents with   Fatigue    Pt reports sx of increased fatigue. Concerns about his thyroid  levels. Is fasting today in case blood work is needed.     HPI Brad Cruz is a 78 y.o. male with past medical history of nonobstructive CAD, hypothyroidism and HLD who presents for f/u of his chronic medical conditions.  He takes pravastatin  and Zetia  for HLD and history of CAD. He denies any chest pain, dyspnea or palpitations.  He follows up with Dr. Londa Rival for history of CAD.  Hypothyroidism: He has been tolerating levothyroxine  75 mcg well.  He denies any recent change in weight or appetite.  He has chronic fatigue, which has improved since increasing dose of levothyroxine . His TSH and free T4 are WNL.  He also takes testosterone  supplement from [facility].  His wife has been diagnosed with metastatic lung cancer, and he had been feeling stressed with it.  He had mild anhedonia in the past, but is manageable now.  Denies any SI or HI currently.  Past Medical History:  Diagnosis Date   Allergy    Mold, cat dander   Arthritis    cerv spine   Asthma    Childhood history   BPH (benign prostatic hyperplasia)    Cataract    Coronary atherosclerosis of native coronary artery    a. 06/2012 Myoview :  No ischemia;  b. 06/25/2012 Cath:  LM nl, LAD 50p, 55m, D1 80 ost (small), D2 nl (small), LCX 30 ost, OM1 nl, RI 30 ost, RCA 50-60- ost, 47m, PDA minor irregs, PL nl (small), EF 65%.    GERD (gastroesophageal reflux disease)    Hyperlipidemia    Hypothyroidism    Migraines    Pulmonary granuloma (HCC)    Chest CT 6/13   Skin cancer, basal cell    BCC face   Vasovagal syncope    Vitamin D  deficiency disease 08/09/2019    Past Surgical History:  Procedure Laterality Date   BIOPSY  09/09/2017   Procedure: BIOPSY;  Surgeon:  Ruby Corporal, MD;  Location: [facility];  Service: Endoscopy;;  gastric   CARDIAC CATHETERIZATION     COLONOSCOPY     Deviated septum repair 1989     ESOPHAGOGASTRODUODENOSCOPY N/A 09/09/2017   Procedure: ESOPHAGOGASTRODUODENOSCOPY (EGD);  Surgeon: Ruby Corporal, MD;  Location: [facility];  Service: Endoscopy;  Laterality: N/A;   LEFT HEART CATHETERIZATION WITH CORONARY ANGIOGRAM N/A 06/25/2012   Procedure: LEFT HEART CATHETERIZATION WITH CORONARY ANGIOGRAM;  Surgeon: Eilleen Grates, MD;  Location[facility] C[facility];  Service: Cardiovascular;  Laterality: N/A;   LUMBAR LAMINECTOMY/DECOMPRESSION MICRODISCECTOMY  11/23/2012   Procedure: LUMBAR LAMINECTOMY/DECOMPRESSION MICRODISCECTOMY 1 LEVEL;  Surgeon: Adelbert Adler, MD;  Location: [facility];  Service: Neurosurgery;  Laterality: Left;  Left Lumbar five-sacral one Diskectomy   SKIN CANCER EXCISION     Under left eye basal cell   SPINE SURGERY      Family History  Problem Relation Age of Onset   Heart disease Mother        age 48   Transient ischemic attack Mother    Stroke Mother    Stomach cancer Sister    Cancer Sister        age 33   Colon polyps Brother    Heart  disease Brother    Thyroid  cancer Brother    Alzheimer's disease Father        50   Colon cancer Neg Hx     Social History   Socioeconomic History   Marital status: Married    Spouse name: bettie   Number of children: 1   Years of education: 19   Highest education level: Professional school degree (e.g., MD, DDS, DVM, JD)  Occupational History   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  Tobacco Use   Smoking status: Never   Smokeless tobacco: Never  Vaping Use   Vaping status: Never Used  Substance and Sexual Activity   Alcohol  use: No    Alcohol /week: 0.0 standard drinks of alcohol    Drug use: No   Sexual activity: Yes    Partners: Female    Birth control/protection: Post-menopausal  Other  Topics Concern   Not on file  Social History Narrative   Lives with [place]   Married 35 years   Involved with church and the [employer]    Social Drivers of Health   Financial Resource Strain: Low Risk  (03/15/2024)   Overall Financial Resource Strain (CARDIA)    Difficulty of Paying Living Expenses: Not hard at all  Food Insecurity: No Food Insecurity (03/15/2024)   Hunger Vital Sign    Worried About Running Out of Food in the Last Year: Never true    Ran Out of Food in the Last Year: Never true  Transportation Needs: No Transportation Needs (03/15/2024)   PRAPARE - Administrator, Civil Service (Medical): No    Lack of Transportation (Non-Medical): No  Physical Activity: Inactive (03/15/2024)   Exercise Vital Sign    Days of Exercise per Week: 0 days    Minutes of Exercise per Session: 0 min  Stress: No Stress Concern Present (03/15/2024)   [employer] of Occupational Health - Occupational Stress Questionnaire    Feeling of Stress : Not at all  Social Connections: Socially Integrated (03/15/2024)   Social Connection and Isolation Panel [NHANES]    Frequency of Communication with Friends and Family: Three times a week    Frequency of Social Gatherings with Friends and Family: Once a week    Attends Religious Services: More than 4 times per year    Active Member of [employer] or Organizations: Yes    Attends Engineer, structural: More than 4 times per year    Marital Status: Married  Catering manager Violence: Not At Risk (03/15/2024)   Humiliation, Afraid, Rape, and Kick questionnaire    Fear of Current or Ex-Partner: No    Emotionally Abused: No    Physically Abused: No    Sexually Abused: No    Outpatient Medications Prior to Visit  Medication Sig Dispense Refill   acetaminophen  (TYLENOL ) 500 MG tablet Take 500 mg by mouth every 6 (six) hours as needed.     Cholecalciferol 125 MCG (5000 UT) capsule Take 10,000 Units by mouth daily.     fexofenadine  (ALLEGRA) 180 MG tablet Take 180 mg by mouth as needed for allergies or rhinitis.     levothyroxine  (SYNTHROID ) 75 MCG tablet TAKE 1 TABLET DAILY BEFORE BREAKFAST (DOSE CHANGE) 90 tablet 3   OVER THE COUNTER MEDICATION Take 1 capsule by mouth daily. [facility] Testosterone  Elite     Psyllium (METAMUCIL) 0.36 g CAPS Take 4 capsules by mouth daily.  Saw Palmetto, Serenoa repens, 320 MG CAPS Take 640 mg at bedtime by mouth.      vitamin B-12 (CYANOCOBALAMIN) 1000 MCG tablet Take 1,000 mcg by mouth daily.     ezetimibe  (ZETIA ) 10 MG tablet Take 1 tablet (10 mg total) by mouth daily. 90 tablet 3   pravastatin  (PRAVACHOL ) 20 MG tablet Take 1 tablet (20 mg total) by mouth every evening. 90 tablet 3   No facility-administered medications prior to visit.    Allergies  Allergen Reactions   Citrus Hives    Avoid grapefruit, OJ   Ibuprofen Hives, Swelling and Rash   Shellfish-Derived Products Hives    Rash    Statins Other (See Comments)    ROS Review of Systems  Constitutional:  Positive for fatigue. Negative for chills and fever.  HENT:  Negative for congestion, postnasal drip, sinus pressure and sore throat.   Eyes:  Negative for pain and discharge.  Respiratory:  Negative for cough and shortness of breath.   Cardiovascular:  Negative for chest pain and palpitations.  Gastrointestinal:  Negative for diarrhea, nausea and vomiting.  Endocrine: Negative for polydipsia and polyuria.  Genitourinary:  Negative for dysuria and hematuria.  Musculoskeletal:  Negative for neck pain and neck stiffness.  Skin:  Negative for rash.  Neurological:  Negative for dizziness, weakness and numbness.  Psychiatric/Behavioral:  Negative for agitation and behavioral problems.       Objective:    Physical Exam Vitals reviewed.  Constitutional:      General: He is not in acute distress.    Appearance: He is not diaphoretic.  HENT:     Head: Normocephalic and atraumatic.     Nose: Nose normal.      Mouth/Throat:     Mouth: Mucous membranes are moist.  Eyes:     General: No scleral icterus.    Extraocular Movements: Extraocular movements intact.  Cardiovascular:     Rate and Rhythm: Normal rate and regular rhythm.     Heart sounds: Normal heart sounds. No murmur heard. Pulmonary:     Breath sounds: Normal breath sounds. No wheezing or rales.  Musculoskeletal:     Cervical back: Neck supple. No tenderness.     Right lower leg: No edema.     Left lower leg: No edema.  Feet:     Left foot:     Toenail Condition: Left toenails are ingrown.  Skin:    General: Skin is warm.     Findings: No rash.  Neurological:     General: No focal deficit present.     Mental Status: He is alert and oriented to person, place, and time.     Sensory: No sensory deficit.     Motor: No weakness.  Psychiatric:        Mood and Affect: Mood normal.        Behavior: Behavior normal.     BP 124/70   Pulse 68   Ht 5\' 9"  (1.753 m)   Wt 182 lb 12.8 oz (82.9 kg)   SpO2 96%   BMI 26.99 kg/m  Wt Readings from Last 3 Encounters:  03/16/24 182 lb 12.8 oz (82.9 kg)  03/15/24 178 lb (80.7 kg)  12/31/23 183 lb (83 kg)    Lab Results  Component Value Date   TSH 2.830 12/21/2023   Lab Results  Component Value Date   WBC 6.6 12/21/2023   HGB 15.1 12/21/2023   HCT 46.0 12/21/2023   MCV 93 12/21/2023  PLT 230 12/21/2023   Lab Results  Component Value Date   NA 138 08/26/2023   K 4.3 08/26/2023   CO2 23 08/26/2023   GLUCOSE 95 08/26/2023   BUN 24 08/26/2023   CREATININE 1.08 08/26/2023   BILITOT 0.9 08/26/2023   ALKPHOS 54 08/26/2023   AST 22 08/26/2023   ALT 22 08/26/2023   PROT 6.9 08/26/2023   ALBUMIN 4.6 08/26/2023   CALCIUM  9.2 08/26/2023   ANIONGAP 7 09/09/2017   EGFR 71 08/26/2023   Lab Results  Component Value Date   CHOL 155 08/26/2023   Lab Results  Component Value Date   HDL 38 (L) 08/26/2023   Lab Results  Component Value Date   LDLCALC 92 08/26/2023   Lab  Results  Component Value Date   TRIG 140 08/26/2023   Lab Results  Component Value Date   CHOLHDL 4.1 08/26/2023   Lab Results  Component Value Date   HGBA1C 5.8 (H) 11/13/2022      Assessment & Plan:   Problem List Items Addressed This Visit       Endocrine   Hypothyroidism - Primary   Lab Results  Component Value Date   TSH 2.830 12/21/2023   On levothyroxine  75 mcg QD He had persistent fatigue, was getting better, but worse recently Check TSH and free T4        Other   Chronic fatigue      No orders of the defined types were placed in this encounter.   Follow-up: No follow-ups on file.    Meldon Sport, MD

## 2024-03-16 NOTE — Patient Instructions (Signed)
Please continue to take medications as prescribed.  Please continue to follow low carb diet and ambulate as tolerated. 

## 2024-03-16 NOTE — Assessment & Plan Note (Signed)
 Lab Results  Component Value Date   TSH 2.830 12/21/2023   On levothyroxine  75 mcg QD He had persistent fatigue, was getting better, but worse recently Check TSH and free T4

## 2024-03-17 ENCOUNTER — Ambulatory Visit: Payer: Self-pay | Admitting: Internal Medicine

## 2024-03-17 LAB — CMP14+EGFR
ALT: 19 IU/L (ref 0–44)
AST: 21 IU/L (ref 0–40)
Albumin: 4.6 g/dL (ref 3.8–4.8)
Alkaline Phosphatase: 56 IU/L (ref 44–121)
BUN/Creatinine Ratio: 24 (ref 10–24)
BUN: 23 mg/dL (ref 8–27)
Bilirubin Total: 0.9 mg/dL (ref 0.0–1.2)
CO2: 23 mmol/L (ref 20–29)
Calcium: 9.2 mg/dL (ref 8.6–10.2)
Chloride: 100 mmol/L (ref 96–106)
Creatinine, Ser: 0.97 mg/dL (ref 0.76–1.27)
Globulin, Total: 2.3 g/dL (ref 1.5–4.5)
Glucose: 89 mg/dL (ref 70–99)
Potassium: 4.6 mmol/L (ref 3.5–5.2)
Sodium: 138 mmol/L (ref 134–144)
Total Protein: 6.9 g/dL (ref 6.0–8.5)
eGFR: 80 mL/min/{1.73_m2} (ref >59)

## 2024-03-17 LAB — VITAMIN B12: Vitamin B-12: 1765 pg/mL — ABNORMAL HIGH (ref 232–1245)

## 2024-03-17 LAB — HEMOGLOBIN A1C
Est. average glucose Bld gHb Est-mCnc: 123 mg/dL
Hgb A1c MFr Bld: 5.9 % — ABNORMAL HIGH (ref 4.8–5.6)

## 2024-03-17 LAB — TSH+FREE T4
Free T4: 1.46 ng/dL (ref 0.82–1.77)
TSH: 2.45 u[IU]/mL (ref 0.450–4.500)

## 2024-03-17 NOTE — Assessment & Plan Note (Signed)
 Overall improved Was on testosterone  therapy in the past Chart review shows normal testosterone  levels in the past Check CMP, TSH, free T4, B12 - adjust dose of Levothyroxine  accordingly MDD/caregiver stress can also cause fatigue

## 2024-03-17 NOTE — Assessment & Plan Note (Signed)
 Lab Results  Component Value Date   HGBA1C 5.9 (H) 03/16/2024   Advised to continue to follow low carb diet

## 2024-03-22 DIAGNOSIS — H35371 Puckering of macula, right eye: Secondary | ICD-10-CM | POA: Diagnosis not present

## 2024-06-23 DIAGNOSIS — N401 Enlarged prostate with lower urinary tract symptoms: Secondary | ICD-10-CM | POA: Diagnosis not present

## 2024-06-23 DIAGNOSIS — E782 Mixed hyperlipidemia: Secondary | ICD-10-CM | POA: Diagnosis not present

## 2024-06-23 DIAGNOSIS — E559 Vitamin D deficiency, unspecified: Secondary | ICD-10-CM | POA: Diagnosis not present

## 2024-06-23 DIAGNOSIS — E039 Hypothyroidism, unspecified: Secondary | ICD-10-CM | POA: Diagnosis not present

## 2024-06-23 DIAGNOSIS — R7303 Prediabetes: Secondary | ICD-10-CM | POA: Diagnosis not present

## 2024-06-23 DIAGNOSIS — N138 Other obstructive and reflux uropathy: Secondary | ICD-10-CM | POA: Diagnosis not present

## 2024-06-23 DIAGNOSIS — I2510 Atherosclerotic heart disease of native coronary artery without angina pectoris: Secondary | ICD-10-CM | POA: Diagnosis not present

## 2024-06-24 ENCOUNTER — Ambulatory Visit: Payer: Self-pay | Admitting: Internal Medicine

## 2024-06-24 LAB — CMP14+EGFR
ALT: 18 IU/L (ref 0–44)
AST: 17 IU/L (ref 0–40)
Albumin: 4.6 g/dL (ref 3.8–4.8)
Alkaline Phosphatase: 55 IU/L (ref 44–121)
BUN/Creatinine Ratio: 21 (ref 10–24)
BUN: 20 mg/dL (ref 8–27)
Bilirubin Total: 0.9 mg/dL (ref 0.0–1.2)
CO2: 23 mmol/L (ref 20–29)
Calcium: 9.1 mg/dL (ref 8.6–10.2)
Chloride: 101 mmol/L (ref 96–106)
Creatinine, Ser: 0.94 mg/dL (ref 0.76–1.27)
Globulin, Total: 2.2 g/dL (ref 1.5–4.5)
Glucose: 92 mg/dL (ref 70–99)
Potassium: 4.4 mmol/L (ref 3.5–5.2)
Sodium: 139 mmol/L (ref 134–144)
Total Protein: 6.8 g/dL (ref 6.0–8.5)
eGFR: 83 mL/min/1.73 (ref >59)

## 2024-06-24 LAB — LIPID PANEL
Chol/HDL Ratio: 4 ratio (ref 0.0–5.0)
Cholesterol, Total: 149 mg/dL (ref 100–199)
HDL: 37 mg/dL — ABNORMAL LOW (ref >39)
LDL Chol Calc (NIH): 90 mg/dL (ref 0–99)
Triglycerides: 122 mg/dL (ref 0–149)
VLDL Cholesterol Cal: 22 mg/dL (ref 5–40)

## 2024-06-24 LAB — TSH+FREE T4
Free T4: 1.34 ng/dL (ref 0.82–1.77)
TSH: 3.23 u[IU]/mL (ref 0.450–4.500)

## 2024-06-24 LAB — HEMOGLOBIN A1C
Est. average glucose Bld gHb Est-mCnc: 123 mg/dL
Hgb A1c MFr Bld: 5.9 % — ABNORMAL HIGH (ref 4.8–5.6)

## 2024-06-24 LAB — PSA: Prostate Specific Ag, Serum: 0.9 ng/mL (ref 0.0–4.0)

## 2024-06-24 LAB — VITAMIN D 25 HYDROXY (VIT D DEFICIENCY, FRACTURES): Vit D, 25-Hydroxy: 46.7 ng/mL (ref 30.0–100.0)

## 2024-06-30 ENCOUNTER — Ambulatory Visit: Payer: Medicare Other | Admitting: Internal Medicine

## 2024-07-04 ENCOUNTER — Other Ambulatory Visit: Payer: Self-pay | Admitting: Cardiology

## 2024-07-12 ENCOUNTER — Encounter: Payer: Self-pay | Admitting: Cardiology

## 2024-07-13 ENCOUNTER — Ambulatory Visit: Attending: Cardiology | Admitting: Cardiology

## 2024-07-13 ENCOUNTER — Encounter: Payer: Self-pay | Admitting: Cardiology

## 2024-07-13 VITALS — BP 118/60 | HR 65 | Ht 69.0 in | Wt 176.0 lb

## 2024-07-13 DIAGNOSIS — E782 Mixed hyperlipidemia: Secondary | ICD-10-CM | POA: Diagnosis not present

## 2024-07-13 DIAGNOSIS — I2510 Atherosclerotic heart disease of native coronary artery without angina pectoris: Secondary | ICD-10-CM | POA: Insufficient documentation

## 2024-07-13 MED ORDER — PRAVASTATIN SODIUM 20 MG PO TABS: 20.0000 mg | ORAL_TABLET | Freq: Every evening | ORAL | 3 refills | Status: DC

## 2024-07-13 MED ORDER — EZETIMIBE 10 MG PO TABS: 10.0000 mg | ORAL_TABLET | Freq: Every day | ORAL | 3 refills | Status: DC

## 2024-07-13 NOTE — Progress Notes (Signed)
    Cardiology Office Note  Date: 07/13/2024   ID: Brad Cruz, DOB [birth year], MRN [id number]  History of Present Illness: Brad Cruz is a 78 y.o. male last seen in August 2024.  He is here for a follow-up visit.  Reports no exertional chest discomfort in the interim.  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  Medications reviewed.  Reports compliance with his medications.  Recent limited refill given by pharmacy for Pravachol , we discussed providing 90-day supplies with 3 refills for both Pravachol  20 mg daily and Zetia  10 mg daily.  He had lab work per PCP office in August which I reviewed.  ECG today is is stable showing sinus rhythm with right bundle branch block.  Physical Exam: VS:  BP 118/60 (BP Location: Right Arm, Patient Position: Sitting, Cuff Size: Normal)   Pulse 65   Ht 5' 9 (1.753 m)   Wt 176 lb (79.8 kg)   SpO2 96%   BMI 25.99 kg/m , BMI Body mass index is 25.99 kg/m.  Wt Readings from Last 3 Encounters:  07/13/24 176 lb (79.8 kg)  03/16/24 182 lb 12.8 oz (82.9 kg)  03/15/24 178 lb (80.7 kg)    General: Patient appears comfortable at rest. HEENT: Conjunctiva and lids normal. Neck: Supple, no elevated JVP or carotid bruits. Lungs: Clear to auscultation, nonlabored breathing at rest. Cardiac: Regular rate and rhythm, no S3 or significant systolic murmur.  ECG:  An ECG dated 06/10/2023 was personally reviewed today and demonstrated:  Rhythm with right bundle branch block.  Labwork: 12/21/2023: Hemoglobin 15.1; Platelets 230 06/23/2024: ALT 18; AST 17; BUN 20; Creatinine, Ser 0.94; Potassium 4.4; Sodium 139; TSH 3.230     Component Value Date/Time   CHOL 149 06/23/2024 0940   TRIG 122 06/23/2024 0940   HDL 37 (L) 06/23/2024 0940   CHOLHDL 4.0 06/23/2024 0940   CHOLHDL 3.7 02/28/2021 0958   LDLCALC 90 06/23/2024 0940   LDLCALC 99 02/28/2021 0958   Other Studies Reviewed Today:  No interval cardiac  testing for review today.  Assessment and Plan:  1.  CAD, nonobstructive by cardiac catheterization in 2013.  We continue observation on medical therapy.  He does not report any angina in the interim.  ECG stable in comparison to last year.  He remains on Pravachol  and Zetia  as outlined below.   2.  Mixed hyperlipidemia.  LDL 90 in August.  Plan to continue Pravachol  20 mg daily and Zetia  10 mg daily.  Refills provided for 90-day supplies with 3 refills.  Disposition:  Follow up 1 year.  Signed, Jayson JUDITHANN Sierras, M.D., F.A.C.C. [facility]

## 2024-07-13 NOTE — Patient Instructions (Signed)
Medication Instructions:  Your physician recommends that you continue on your current medications as directed. Please refer to the Current Medication list given to you today.   Labwork: None today  Testing/Procedures: None today  Follow-Up: 1 year Dr.McDowell  Any Other Special Instructions Will Be Listed Below (If Applicable).  If you need a refill on your cardiac medications before your next appointment, please call your pharmacy.

## 2024-07-14 ENCOUNTER — Other Ambulatory Visit: Payer: Self-pay

## 2024-07-14 ENCOUNTER — Encounter: Payer: Self-pay | Admitting: Cardiology

## 2024-07-14 MED ORDER — EZETIMIBE 10 MG PO TABS: 10.0000 mg | ORAL_TABLET | Freq: Every day | ORAL | 3 refills | Status: AC

## 2024-07-14 MED ORDER — PRAVASTATIN SODIUM 20 MG PO TABS: 20.0000 mg | ORAL_TABLET | Freq: Every evening | ORAL | 3 refills | Status: AC

## 2024-08-02 DIAGNOSIS — Z85828 Personal history of other malignant neoplasm of skin: Secondary | ICD-10-CM | POA: Diagnosis not present

## 2024-08-02 DIAGNOSIS — L720 Epidermal cyst: Secondary | ICD-10-CM | POA: Diagnosis not present

## 2024-08-17 ENCOUNTER — Encounter (INDEPENDENT_AMBULATORY_CARE_PROVIDER_SITE_OTHER): Payer: Self-pay | Admitting: Gastroenterology

## 2024-08-24 ENCOUNTER — Ambulatory Visit: Admitting: Cardiology

## 2024-09-19 ENCOUNTER — Ambulatory Visit: Admitting: Internal Medicine

## 2024-09-20 ENCOUNTER — Ambulatory Visit: Admitting: Internal Medicine

## 2024-09-27 DIAGNOSIS — H35433 Paving stone degeneration of retina, bilateral: Secondary | ICD-10-CM | POA: Diagnosis not present

## 2024-09-27 DIAGNOSIS — H524 Presbyopia: Secondary | ICD-10-CM | POA: Diagnosis not present

## 2024-11-04 ENCOUNTER — Encounter: Payer: Self-pay | Admitting: Internal Medicine

## 2024-11-04 ENCOUNTER — Ambulatory Visit: Admitting: Internal Medicine

## 2024-11-04 VITALS — BP 119/68 | HR 75 | Ht 69.0 in | Wt 175.4 lb

## 2024-11-04 DIAGNOSIS — F4320 Adjustment disorder, unspecified: Secondary | ICD-10-CM | POA: Diagnosis not present

## 2024-11-04 DIAGNOSIS — E039 Hypothyroidism, unspecified: Secondary | ICD-10-CM

## 2024-11-04 DIAGNOSIS — Z23 Encounter for immunization: Secondary | ICD-10-CM

## 2024-11-04 DIAGNOSIS — L239 Allergic contact dermatitis, unspecified cause: Secondary | ICD-10-CM

## 2024-11-04 DIAGNOSIS — F4321 Adjustment disorder with depressed mood: Secondary | ICD-10-CM

## 2024-11-04 DIAGNOSIS — I2510 Atherosclerotic heart disease of native coronary artery without angina pectoris: Secondary | ICD-10-CM

## 2024-11-04 MED ORDER — IODOQUINOL-HYDROCORTISONE-ALOE 1-1.9 % EX CREA: TOPICAL_CREAM | CUTANEOUS | 0 refills | Status: AC

## 2024-11-04 MED ORDER — IODOQUINOL-HYDROCORTISONE-ALOE 1-1.9 % EX CREA: TOPICAL_CREAM | CUTANEOUS | 0 refills | Status: DC

## 2024-11-04 NOTE — Assessment & Plan Note (Signed)
Asymptomatic currently On statin Followed by cardiology 

## 2024-11-04 NOTE — Assessment & Plan Note (Signed)
 Likely due to acute stressor in the setting of his wife's death Use to have caregiver fatigue previously Has adequate family support Offered BH therapy, but he gets counseling through church Would avoid any antidepressant currently

## 2024-11-04 NOTE — Assessment & Plan Note (Addendum)
 Rash over face could be allergic dermatitis/urticaria Has responded well to iodoquinol-hydrocortisone-aloe cream, refilled

## 2024-11-04 NOTE — Progress Notes (Signed)
 "  Established Patient Office Visit  Subjective:  Patient ID: Brad Cruz, male    DOB: 06/30/1946  Age: 79 y.o. MRN: [id number]  CC:  Chief Complaint  Patient presents with   Hypothyroidism    6 month f/u    Depression    States he lost his spouse in November has been going through grief which spikes up skin rash, would like a refill on hydrocortison cream since rx has expired from years back. Would like rx to go to [facility].    HPI Brad Cruz is a 79 y.o. male with past medical history of nonobstructive CAD, hypothyroidism and HLD who presents for f/u of his chronic medical conditions.  He takes pravastatin  and Zetia  for HLD and history of CAD. He denies any chest pain, dyspnea or palpitations.  He follows up with Dr. Debera for history of CAD.  Hypothyroidism: He has been tolerating levothyroxine  75 mcg well. He has chronic fatigue and anhedonia, which has been worse recently, but he attributes it to grief. He was initially feeling better since increasing dose of levothyroxine . His TSH and free T4 were WNL in 08/25.  He also takes testosterone  supplement from [facility].  He lost his wife on 09/19/24. His wife had been diagnosed with metastatic lung cancer, and he had been feeling stressed with it.  He has mild anhedonia and feels lonely, but is manageable currently.  He has adequate family support and goes to church as well.  Denies any SI or HI currently.  He reports having recurrent rash on the face area, for which he was prescribed hydrocortisone-iodoquinol cream by dermatology in the past, which was effective and requests refill of it.  Past Medical History:  Diagnosis Date   Allergy    Mold, cat dander   Arthritis    cerv spine   Asthma    Childhood history   BPH (benign prostatic hyperplasia)    Cataract    Coronary atherosclerosis of native coronary artery    a. 06/2012 Myoview :  No ischemia;  b. 06/25/2012 Cath:  LM nl, LAD 50p, 74m, D1 80 ost (small), D2 nl  (small), LCX 30 ost, OM1 nl, RI 30 ost, RCA 50-60- ost, 35m, PDA minor irregs, PL nl (small), EF 65%.    GERD (gastroesophageal reflux disease)    Hyperlipidemia    Hypothyroidism    Migraines    Pulmonary granuloma (HCC)    Chest CT 6/13   Skin cancer, basal cell    BCC face   Vasovagal syncope    Vitamin D  deficiency disease 08/09/2019    Past Surgical History:  Procedure Laterality Date   BIOPSY  09/09/2017   Procedure: BIOPSY;  Surgeon: Golda Claudis PENNER, MD;  Location: [facility];  Service: Endoscopy;;  gastric   CARDIAC CATHETERIZATION     COLONOSCOPY     Deviated septum repair 1989     ESOPHAGOGASTRODUODENOSCOPY N/A 09/09/2017   Procedure: ESOPHAGOGASTRODUODENOSCOPY (EGD);  Surgeon: Golda Claudis PENNER, MD;  Location: [facility];  Service: Endoscopy;  Laterality: N/A;   LEFT HEART CATHETERIZATION WITH CORONARY ANGIOGRAM N/A 06/25/2012   Procedure: LEFT HEART CATHETERIZATION WITH CORONARY ANGIOGRAM;  Surgeon: Lynwood Schilling, MD;  Location[facility] C[facility] LAB;  Service: Cardiovascular;  Laterality: N/A;   LUMBAR LAMINECTOMY/DECOMPRESSION MICRODISCECTOMY  11/23/2012   Procedure: LUMBAR LAMINECTOMY/DECOMPRESSION MICRODISCECTOMY 1 LEVEL;  Surgeon: Catalina CHRISTELLA Stains, MD;  Location: [facility]UR[facility];  Service: Neurosurgery;  Laterality: Left;  Left Lumbar five-sacral one Diskectomy   SKIN  CANCER EXCISION     Under left eye basal cell   SPINE SURGERY      Family History  Problem Relation Age of Onset   Heart disease Mother        age 64   Transient ischemic attack Mother    Stroke Mother    Stomach cancer Sister    Cancer Sister        age 67   Colon polyps Brother    Heart disease Brother    Thyroid  cancer Brother    Alzheimer's disease Father        43   Colon cancer Neg Hx     Social History   Socioeconomic History   Marital status: Married    Spouse name: bettie   Number of children: 1   Years of education: 7   Highest education level: Professional school degree (e.g., MD,  DDS, DVM, JD)  Occupational History   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  Tobacco Use   Smoking status: Never   Smokeless tobacco: Never  Vaping Use   Vaping status: Never Used  Substance and Sexual Activity   Alcohol  use: No    Alcohol /week: 0.0 standard drinks of alcohol    Drug use: No   Sexual activity: Yes    Partners: Female    Birth control/protection: Post-menopausal  Other Topics Concern   Not on file  Social History Narrative   Lives with Brad Cruz   Married 35 years   Involved with church and the [employer]    Social Drivers of Health   Tobacco Use: Low Risk (11/04/2024)   Patient History    Smoking Tobacco Use: Never    Smokeless Tobacco Use: Never    Passive Exposure: Not on file  Financial Resource Strain: Low Risk (03/15/2024)   Overall Financial Resource Strain (CARDIA)    Difficulty of Paying Living Expenses: Not hard at all  Food Insecurity: No Food Insecurity (03/15/2024)   Hunger Vital Sign    Worried About Running Out of Food in the Last Year: Never true    Ran Out of Food in the Last Year: Never true  Transportation Needs: No Transportation Needs (03/15/2024)   PRAPARE - Administrator, Civil Service (Medical): No    Lack of Transportation (Non-Medical): No  Physical Activity: Inactive (03/15/2024)   Exercise Vital Sign    Days of Exercise per Week: 0 days    Minutes of Exercise per Session: 0 min  Stress: No Stress Concern Present (03/15/2024)   [employer] of Occupational Health - Occupational Stress Questionnaire    Feeling of Stress : Not at all  Social Connections: Socially Integrated (03/15/2024)   Social Connection and Isolation Panel    Frequency of Communication with Friends and Family: Three times a week    Frequency of Social Gatherings with Friends and Family: Once a week    Attends Religious Services: More than 4 times per year    Active Member of [employer] or Organizations:  Yes    Attends Banker Meetings: More than 4 times per year    Marital Status: Married  Catering Manager Violence: Not At Risk (03/15/2024)   Humiliation, Afraid, Rape, and Kick questionnaire    Fear of Current or Ex-Partner: No    Emotionally Abused: No    Physically Abused: No    Sexually Abused: No  Depression (PHQ2-9): Low Risk (11/04/2024)  Depression (PHQ2-9)    PHQ-2 Score: 0  Alcohol  Screen: Low Risk (03/15/2024)   Alcohol  Screen    Last Alcohol  Screening Score (AUDIT): 0  Housing: Low Risk (03/15/2024)   Housing Stability Vital Sign    Unable to Pay for Housing in the Last Year: No    Number of Times Moved in the Last Year: 0    Homeless in the Last Year: No  Utilities: Not At Risk (03/15/2024)   AHC Utilities    Threatened with loss of utilities: No  Health Literacy: Adequate Health Literacy (03/15/2024)   B1300 Health Literacy    Frequency of need for help with medical instructions: Never    Outpatient Medications Prior to Visit  Medication Sig Dispense Refill   acetaminophen  (TYLENOL ) 500 MG tablet Take 500 mg by mouth every 6 (six) hours as needed.     Cholecalciferol 125 MCG (5000 UT) capsule Take 10,000 Units by mouth daily.     ezetimibe  (ZETIA ) 10 MG tablet Take 1 tablet (10 mg total) by mouth daily. 90 tablet 3   fexofenadine (ALLEGRA) 180 MG tablet Take 180 mg by mouth as needed for allergies or rhinitis.     levothyroxine  (SYNTHROID ) 75 MCG tablet TAKE 1 TABLET DAILY BEFORE BREAKFAST (DOSE CHANGE) 90 tablet 3   OVER THE COUNTER MEDICATION Take 1 capsule by mouth daily. [employer] Testosterone  Elite     pravastatin  (PRAVACHOL ) 20 MG tablet Take 1 tablet (20 mg total) by mouth every evening. 90 tablet 3   Psyllium (METAMUCIL) 0.36 g CAPS Take 4 capsules by mouth daily.     Saw Palmetto, Serenoa repens, 320 MG CAPS Take 640 mg at bedtime by mouth.      vitamin B-12 (CYANOCOBALAMIN ) 1000 MCG tablet Take 1,000 mcg by mouth daily.     No  facility-administered medications prior to visit.    Allergies  Allergen Reactions   Citrus Hives    Avoid grapefruit, OJ   Ibuprofen Hives, Swelling and Rash   Shellfish Protein-Containing Drug Products Hives    Rash    Statins Other (See Comments)    ROS Review of Systems  Constitutional:  Positive for fatigue. Negative for chills and fever.  HENT:  Negative for congestion, postnasal drip, sinus pressure and sore throat.   Eyes:  Negative for pain and discharge.  Respiratory:  Negative for cough and shortness of breath.   Cardiovascular:  Negative for chest pain and palpitations.  Gastrointestinal:  Negative for diarrhea, nausea and vomiting.  Endocrine: Negative for polydipsia and polyuria.  Genitourinary:  Negative for dysuria and hematuria.  Musculoskeletal:  Negative for neck pain and neck stiffness.  Skin:  Positive for rash.  Neurological:  Negative for dizziness, weakness and numbness.  Psychiatric/Behavioral:  Positive for dysphoric mood. Negative for agitation and behavioral problems.       Objective:    Physical Exam Vitals reviewed.  Constitutional:      General: He is not in acute distress.    Appearance: He is not diaphoretic.  HENT:     Head: Normocephalic and atraumatic.     Nose: Nose normal.     Mouth/Throat:     Mouth: Mucous membranes are moist.  Eyes:     General: No scleral icterus.    Extraocular Movements: Extraocular movements intact.  Cardiovascular:     Rate and Rhythm: Normal rate and regular rhythm.     Heart sounds: Normal heart sounds. No murmur heard. Pulmonary:     Breath sounds:  Normal breath sounds. No wheezing or rales.  Musculoskeletal:     Cervical back: Neck supple. No tenderness.     Right lower leg: No edema.     Left lower leg: No edema.  Skin:    General: Skin is warm.     Findings: Rash (Recurrent erythematous rash over facial area, near lips) present.  Neurological:     General: No focal deficit present.      Mental Status: He is alert and oriented to person, place, and time.     Sensory: No sensory deficit.     Motor: No weakness.  Psychiatric:        Mood and Affect: Mood normal.        Behavior: Behavior normal.     BP 119/68   Pulse 75   Ht 5' 9 (1.753 m)   Wt 175 lb 6.4 oz (79.6 kg)   SpO2 96%   BMI 25.90 kg/m  Wt Readings from Last 3 Encounters:  11/04/24 175 lb 6.4 oz (79.6 kg)  07/13/24 176 lb (79.8 kg)  03/16/24 182 lb 12.8 oz (82.9 kg)    Lab Results  Component Value Date   TSH 3.230 06/23/2024   Lab Results  Component Value Date   WBC 6.6 12/21/2023   HGB 15.1 12/21/2023   HCT 46.0 12/21/2023   MCV 93 12/21/2023   PLT 230 12/21/2023   Lab Results  Component Value Date   NA 139 06/23/2024   K 4.4 06/23/2024   CO2 23 06/23/2024   GLUCOSE 92 06/23/2024   BUN 20 06/23/2024   CREATININE 0.94 06/23/2024   BILITOT 0.9 06/23/2024   ALKPHOS 55 06/23/2024   AST 17 06/23/2024   ALT 18 06/23/2024   PROT 6.8 06/23/2024   ALBUMIN 4.6 06/23/2024   CALCIUM  9.1 06/23/2024   ANIONGAP 7 09/09/2017   EGFR 83 06/23/2024   Lab Results  Component Value Date   CHOL 149 06/23/2024   Lab Results  Component Value Date   HDL 37 (L) 06/23/2024   Lab Results  Component Value Date   LDLCALC 90 06/23/2024   Lab Results  Component Value Date   TRIG 122 06/23/2024   Lab Results  Component Value Date   CHOLHDL 4.0 06/23/2024   Lab Results  Component Value Date   HGBA1C 5.9 (H) 06/23/2024      Assessment & Plan:   Problem List Items Addressed This Visit       Cardiovascular and Mediastinum   CAD (coronary artery disease), native coronary artery   Asymptomatic currently On statin Followed by cardiology        Endocrine   Hypothyroidism - Primary   Lab Results  Component Value Date   TSH 3.230 06/23/2024   On levothyroxine  75 mcg QD He had persistent fatigue, was getting better initially Check TSH and free T4      Relevant Orders   TSH + free T4    CMP14+EGFR     Musculoskeletal and Integument   Allergic dermatitis   Rash over face could be allergic dermatitis/urticaria Has responded well to iodoquinol-hydrocortisone-aloe cream, refilled        Other   Adjustment disorder with depressed mood   Likely due to acute stressor in the setting of his wife's death Use to have caregiver fatigue previously Has adequate family support Offered BH therapy, but he gets counseling through church Would avoid any antidepressant currently      Relevant Medications   Iodoquinol-Hydrocortisone-Aloe 1-1.9 %  CREA   Grief reaction   Recently lost his wife in 11/25 Has adequate family support Reassurance provided about common symptoms Offered BH therapy, he prefers to get counseling through church      Other Visit Diagnoses       Encounter for immunization       Relevant Orders   Flu vaccine HIGH DOSE PF(Fluzone Trivalent) (Completed)         Meds ordered this encounter  Medications   DISCONTD: Iodoquinol-Hydrocortisone-Aloe 1-1.9 % CREA    Sig: Apply cream to affected area once daily.    Dispense:  60 g    Refill:  0   Iodoquinol-Hydrocortisone-Aloe 1-1.9 % CREA    Sig: Apply cream to affected area once daily.    Dispense:  60 g    Refill:  0    Follow-up: Return in about 6 months (around 05/04/2025) for Hypothyroidism.    Suzzane MARLA Blanch, MD "

## 2024-11-04 NOTE — Assessment & Plan Note (Signed)
 Recently lost his wife in 11/25 Has adequate family support Reassurance provided about common symptoms Offered BH therapy, he prefers to get counseling through church

## 2024-11-04 NOTE — Patient Instructions (Signed)
 Please continue to take medications as prescribed.  Please continue to follow low salt diet and perform moderate exercise/walking as tolerated.

## 2024-11-04 NOTE — Assessment & Plan Note (Addendum)
 Lab Results  Component Value Date   TSH 3.230 06/23/2024   On levothyroxine  75 mcg QD He had persistent fatigue, was getting better initially Check TSH and free T4

## 2024-11-05 LAB — CMP14+EGFR
ALT: 24 IU/L (ref 0–44)
AST: 24 IU/L (ref 0–40)
Albumin: 4.7 g/dL (ref 3.8–4.8)
Alkaline Phosphatase: 49 IU/L (ref 47–123)
BUN/Creatinine Ratio: 17 (ref 10–24)
BUN: 18 mg/dL (ref 8–27)
Bilirubin Total: 1 mg/dL (ref 0.0–1.2)
CO2: 25 mmol/L (ref 20–29)
Calcium: 9.7 mg/dL (ref 8.6–10.2)
Chloride: 100 mmol/L (ref 96–106)
Creatinine, Ser: 1.06 mg/dL (ref 0.76–1.27)
Globulin, Total: 2.6 g/dL (ref 1.5–4.5)
Glucose: 96 mg/dL (ref 70–99)
Potassium: 5 mmol/L (ref 3.5–5.2)
Sodium: 140 mmol/L (ref 134–144)
Total Protein: 7.3 g/dL (ref 6.0–8.5)
eGFR: 72 mL/min/1.73

## 2024-11-05 LAB — TSH+FREE T4
Free T4: 1.48 ng/dL (ref 0.82–1.77)
TSH: 3.13 u[IU]/mL (ref 0.450–4.500)

## 2024-11-07 ENCOUNTER — Encounter: Payer: Self-pay | Admitting: Internal Medicine

## 2024-11-07 ENCOUNTER — Ambulatory Visit: Payer: Self-pay | Admitting: Internal Medicine

## 2024-11-07 MED ORDER — HYDROCORTISONE 1 % EX CREA: 1.0000 | TOPICAL_CREAM | Freq: Two times a day (BID) | CUTANEOUS | 0 refills | Status: AC

## 2024-11-07 NOTE — Addendum Note (Signed)
 Addended byBETHA TOBIE DOWNS on: 11/07/2024 04:59 PM   Modules accepted: Orders

## 2025-03-20 ENCOUNTER — Ambulatory Visit

## 2025-05-09 ENCOUNTER — Ambulatory Visit: Payer: Self-pay | Admitting: Internal Medicine
